# Patient Record
Sex: Male | Born: 1950 | Race: White | Hispanic: No | Marital: Married | Smoking: Former smoker
Health system: Southern US, Community
[De-identification: ages and names within clinical notes are randomized; demographics above are authoritative.]

## PROBLEM LIST (undated history)

## (undated) DIAGNOSIS — I519 Heart disease, unspecified: Secondary | ICD-10-CM

## (undated) DIAGNOSIS — E7800 Pure hypercholesterolemia, unspecified: Secondary | ICD-10-CM

## (undated) DIAGNOSIS — E669 Obesity, unspecified: Secondary | ICD-10-CM

## (undated) DIAGNOSIS — K449 Diaphragmatic hernia without obstruction or gangrene: Secondary | ICD-10-CM

## (undated) DIAGNOSIS — Z9289 Personal history of other medical treatment: Secondary | ICD-10-CM

## (undated) DIAGNOSIS — K219 Gastro-esophageal reflux disease without esophagitis: Secondary | ICD-10-CM

## (undated) DIAGNOSIS — I10 Essential (primary) hypertension: Secondary | ICD-10-CM

## (undated) DIAGNOSIS — E785 Hyperlipidemia, unspecified: Secondary | ICD-10-CM

## (undated) DIAGNOSIS — K760 Fatty (change of) liver, not elsewhere classified: Secondary | ICD-10-CM

## (undated) DIAGNOSIS — I2510 Atherosclerotic heart disease of native coronary artery without angina pectoris: Secondary | ICD-10-CM

## (undated) DIAGNOSIS — G4730 Sleep apnea, unspecified: Secondary | ICD-10-CM

## (undated) DIAGNOSIS — R011 Cardiac murmur, unspecified: Secondary | ICD-10-CM

## (undated) HISTORY — PX: CAROTID STENT: SHX1301

## (undated) HISTORY — DX: Hyperlipidemia, unspecified: E78.5

## (undated) HISTORY — DX: Obesity, unspecified: E66.9

## (undated) HISTORY — DX: Gastro-esophageal reflux disease without esophagitis: K21.9

## (undated) HISTORY — DX: Personal history of other medical treatment: Z92.89

## (undated) HISTORY — PX: TONSILLECTOMY: SUR1361

## (undated) HISTORY — DX: Atherosclerotic heart disease of native coronary artery without angina pectoris: I25.10

---

## 2000-10-06 ENCOUNTER — Other Ambulatory Visit: Admission: RE | Admit: 2000-10-06 | Discharge: 2000-10-06 | Payer: Self-pay | Admitting: Gastroenterology

## 2000-10-06 ENCOUNTER — Encounter (INDEPENDENT_AMBULATORY_CARE_PROVIDER_SITE_OTHER): Payer: Self-pay | Admitting: Specialist

## 2000-10-12 ENCOUNTER — Encounter: Payer: Self-pay | Admitting: Gastroenterology

## 2000-10-12 ENCOUNTER — Ambulatory Visit (HOSPITAL_COMMUNITY): Admission: RE | Admit: 2000-10-12 | Discharge: 2000-10-12 | Payer: Self-pay | Admitting: Gastroenterology

## 2002-05-24 ENCOUNTER — Ambulatory Visit (HOSPITAL_COMMUNITY): Admission: RE | Admit: 2002-05-24 | Discharge: 2002-05-24 | Payer: Self-pay | Admitting: Internal Medicine

## 2002-05-25 ENCOUNTER — Encounter (INDEPENDENT_AMBULATORY_CARE_PROVIDER_SITE_OTHER): Payer: Self-pay | Admitting: Internal Medicine

## 2002-05-25 ENCOUNTER — Ambulatory Visit (HOSPITAL_COMMUNITY): Admission: RE | Admit: 2002-05-25 | Discharge: 2002-05-25 | Payer: Self-pay | Admitting: Internal Medicine

## 2002-07-19 ENCOUNTER — Ambulatory Visit (HOSPITAL_COMMUNITY): Admission: RE | Admit: 2002-07-19 | Discharge: 2002-07-19 | Payer: Self-pay | Admitting: Internal Medicine

## 2002-07-19 ENCOUNTER — Encounter (INDEPENDENT_AMBULATORY_CARE_PROVIDER_SITE_OTHER): Payer: Self-pay | Admitting: Internal Medicine

## 2003-03-19 ENCOUNTER — Ambulatory Visit (HOSPITAL_COMMUNITY): Admission: RE | Admit: 2003-03-19 | Discharge: 2003-03-19 | Payer: Self-pay | Admitting: Pulmonary Disease

## 2003-05-16 ENCOUNTER — Emergency Department (HOSPITAL_COMMUNITY): Admission: EM | Admit: 2003-05-16 | Discharge: 2003-05-16 | Payer: Self-pay | Admitting: Emergency Medicine

## 2004-08-06 ENCOUNTER — Ambulatory Visit (HOSPITAL_COMMUNITY): Admission: RE | Admit: 2004-08-06 | Discharge: 2004-08-06 | Payer: Self-pay | Admitting: Internal Medicine

## 2004-08-06 ENCOUNTER — Encounter (INDEPENDENT_AMBULATORY_CARE_PROVIDER_SITE_OTHER): Payer: Self-pay | Admitting: Specialist

## 2005-04-08 ENCOUNTER — Ambulatory Visit: Payer: Self-pay | Admitting: Internal Medicine

## 2005-04-10 ENCOUNTER — Ambulatory Visit (HOSPITAL_COMMUNITY): Admission: RE | Admit: 2005-04-10 | Discharge: 2005-04-10 | Payer: Self-pay | Admitting: Internal Medicine

## 2005-07-17 ENCOUNTER — Ambulatory Visit: Payer: Self-pay | Admitting: Internal Medicine

## 2006-03-29 ENCOUNTER — Emergency Department (HOSPITAL_COMMUNITY): Admission: EM | Admit: 2006-03-29 | Discharge: 2006-03-30 | Payer: Self-pay | Admitting: Emergency Medicine

## 2006-04-21 ENCOUNTER — Ambulatory Visit: Payer: Self-pay | Admitting: Internal Medicine

## 2006-09-23 ENCOUNTER — Ambulatory Visit (HOSPITAL_COMMUNITY): Admission: RE | Admit: 2006-09-23 | Discharge: 2006-09-23 | Payer: Self-pay | Admitting: Pulmonary Disease

## 2006-09-30 ENCOUNTER — Encounter (HOSPITAL_COMMUNITY): Admission: RE | Admit: 2006-09-30 | Discharge: 2006-10-30 | Payer: Self-pay | Admitting: Pulmonary Disease

## 2006-10-12 ENCOUNTER — Ambulatory Visit: Payer: Self-pay | Admitting: Gastroenterology

## 2008-09-20 ENCOUNTER — Ambulatory Visit (HOSPITAL_COMMUNITY): Admission: RE | Admit: 2008-09-20 | Discharge: 2008-09-20 | Payer: Self-pay | Admitting: Pulmonary Disease

## 2008-09-26 ENCOUNTER — Ambulatory Visit: Payer: Self-pay | Admitting: Cardiology

## 2008-09-26 ENCOUNTER — Ambulatory Visit (HOSPITAL_COMMUNITY): Admission: RE | Admit: 2008-09-26 | Discharge: 2008-09-26 | Payer: Self-pay | Admitting: Pulmonary Disease

## 2008-09-26 ENCOUNTER — Encounter (INDEPENDENT_AMBULATORY_CARE_PROVIDER_SITE_OTHER): Payer: Self-pay | Admitting: Pulmonary Disease

## 2008-10-09 ENCOUNTER — Ambulatory Visit (HOSPITAL_COMMUNITY): Admission: RE | Admit: 2008-10-09 | Discharge: 2008-10-09 | Payer: Self-pay | Admitting: Pulmonary Disease

## 2008-10-10 ENCOUNTER — Ambulatory Visit (HOSPITAL_COMMUNITY): Admission: RE | Admit: 2008-10-10 | Discharge: 2008-10-10 | Payer: Self-pay | Admitting: Pulmonary Disease

## 2008-11-17 ENCOUNTER — Encounter: Payer: Self-pay | Admitting: Orthopedic Surgery

## 2008-11-17 ENCOUNTER — Emergency Department (HOSPITAL_COMMUNITY): Admission: EM | Admit: 2008-11-17 | Discharge: 2008-11-17 | Payer: Self-pay | Admitting: Emergency Medicine

## 2008-11-20 ENCOUNTER — Ambulatory Visit: Payer: Self-pay | Admitting: Orthopedic Surgery

## 2008-11-20 DIAGNOSIS — S8253XA Displaced fracture of medial malleolus of unspecified tibia, initial encounter for closed fracture: Secondary | ICD-10-CM | POA: Insufficient documentation

## 2008-11-20 DIAGNOSIS — S93409A Sprain of unspecified ligament of unspecified ankle, initial encounter: Secondary | ICD-10-CM | POA: Insufficient documentation

## 2008-11-26 ENCOUNTER — Encounter: Payer: Self-pay | Admitting: Orthopedic Surgery

## 2008-12-28 DIAGNOSIS — I219 Acute myocardial infarction, unspecified: Secondary | ICD-10-CM

## 2008-12-28 HISTORY — DX: Acute myocardial infarction, unspecified: I21.9

## 2008-12-31 ENCOUNTER — Ambulatory Visit: Payer: Self-pay | Admitting: Orthopedic Surgery

## 2009-10-28 HISTORY — PX: CARDIAC CATHETERIZATION: SHX172

## 2009-11-08 ENCOUNTER — Inpatient Hospital Stay (HOSPITAL_COMMUNITY): Admission: RE | Admit: 2009-11-08 | Discharge: 2009-11-10 | Payer: Self-pay | Admitting: Cardiology

## 2009-12-09 ENCOUNTER — Encounter (HOSPITAL_COMMUNITY): Admission: RE | Admit: 2009-12-09 | Discharge: 2009-12-27 | Payer: Self-pay | Admitting: Cardiology

## 2009-12-30 ENCOUNTER — Encounter (HOSPITAL_COMMUNITY): Admission: RE | Admit: 2009-12-30 | Discharge: 2010-01-29 | Payer: Self-pay | Admitting: Cardiology

## 2010-01-30 ENCOUNTER — Encounter (HOSPITAL_COMMUNITY): Admission: RE | Admit: 2010-01-30 | Discharge: 2010-03-01 | Payer: Self-pay | Admitting: Cardiology

## 2010-03-03 ENCOUNTER — Encounter (HOSPITAL_COMMUNITY): Admission: RE | Admit: 2010-03-03 | Discharge: 2010-04-02 | Payer: Self-pay | Admitting: Cardiology

## 2010-06-02 ENCOUNTER — Ambulatory Visit (HOSPITAL_COMMUNITY): Admission: RE | Admit: 2010-06-02 | Discharge: 2010-06-02 | Payer: Self-pay | Admitting: Pulmonary Disease

## 2010-07-25 ENCOUNTER — Encounter: Admission: RE | Admit: 2010-07-25 | Discharge: 2010-07-25 | Payer: Self-pay | Admitting: Neurosurgery

## 2010-08-08 ENCOUNTER — Encounter: Admission: RE | Admit: 2010-08-08 | Discharge: 2010-08-08 | Payer: Self-pay | Admitting: Neurosurgery

## 2010-12-23 DIAGNOSIS — R0789 Other chest pain: Secondary | ICD-10-CM

## 2011-01-26 ENCOUNTER — Ambulatory Visit
Admission: RE | Admit: 2011-01-26 | Discharge: 2011-01-26 | Payer: Self-pay | Source: Home / Self Care | Attending: Internal Medicine | Admitting: Internal Medicine

## 2011-02-02 ENCOUNTER — Encounter: Payer: Self-pay | Admitting: Cardiology

## 2011-02-12 NOTE — Procedures (Signed)
Summary: [facility]  [facility]   Imported By: Faythe Ghee 02/02/2011 16:01:10  _____________________________________________________________________  External Attachment:    Type:   Image     Comment:   External Document

## 2011-04-01 LAB — POCT CARDIAC MARKERS: Myoglobin, poc: 196 ng/mL (ref 12–200)

## 2011-04-01 LAB — HEMOGLOBIN A1C
Hgb A1c MFr Bld: 5.6 % (ref 4.6–6.1)
Mean Plasma Glucose: 114 mg/dL

## 2011-04-01 LAB — CBC
HCT: 45.2 % (ref 39.0–52.0)
HCT: 40.8 % (ref 39.0–52.0)
Hemoglobin: 15.6 g/dL (ref 13.0–17.0)
Hemoglobin: 14.4 g/dL (ref 13.0–17.0)
MCV: 85.1 fL (ref 78.0–100.0)
Platelets: 245 10*3/uL (ref 150–400)
RBC: 5.31 MIL/uL (ref 4.22–5.81)
RDW: 14 % (ref 11.5–15.5)
WBC: 7.2 10*3/uL (ref 4.0–10.5)
WBC: 11.2 10*3/uL — ABNORMAL HIGH (ref 4.0–10.5)

## 2011-04-01 LAB — COMPREHENSIVE METABOLIC PANEL
CO2: 25 mEq/L (ref 19–32)
Calcium: 8.5 mg/dL (ref 8.4–10.5)
Chloride: 103 mEq/L (ref 96–112)
Creatinine, Ser: 0.99 mg/dL (ref 0.4–1.5)
GFR calc non Af Amer: >60 mL/min (ref >60)
Glucose, Bld: 97 mg/dL (ref 70–99)
Potassium: 3.4 mEq/L — ABNORMAL LOW (ref 3.5–5.1)
Total Protein: 6.3 g/dL (ref 6.0–8.3)

## 2011-04-01 LAB — BASIC METABOLIC PANEL
BUN: 12 mg/dL (ref 6–23)
BUN: 9 mg/dL (ref 6–23)
CO2: 27 mEq/L (ref 19–32)
Calcium: 8.6 mg/dL (ref 8.4–10.5)
Calcium: 7.9 mg/dL — ABNORMAL LOW (ref 8.4–10.5)
Chloride: 105 mEq/L (ref 96–112)
Chloride: 106 mEq/L (ref 96–112)
Creatinine, Ser: 1 mg/dL (ref 0.4–1.5)
GFR calc Af Amer: >60 mL/min (ref >60)
Potassium: 3.7 mEq/L (ref 3.5–5.1)
Sodium: 139 mEq/L (ref 135–145)
Sodium: 138 mEq/L (ref 135–145)

## 2011-04-01 LAB — URINALYSIS, ROUTINE W REFLEX MICROSCOPIC
Bilirubin Urine: NEGATIVE
Ketones, ur: NEGATIVE mg/dL
Nitrite: NEGATIVE
Protein, ur: NEGATIVE mg/dL
Urobilinogen, UA: 0.2 mg/dL (ref 0.0–1.0)
pH: 5.5 (ref 5.0–8.0)

## 2011-04-01 LAB — CARDIAC PANEL(CRET KIN+CKTOT+MB+TROPI)
CK, MB: 126.6 ng/mL — ABNORMAL HIGH (ref 0.3–4.0)
Relative Index: 9.5 — ABNORMAL HIGH (ref 0.0–2.5)
Total CK: 1045 U/L — ABNORMAL HIGH (ref 7–232)
Total CK: 751 U/L — ABNORMAL HIGH (ref 7–232)
Troponin I: 18.52 ng/mL (ref 0.00–0.06)
Troponin I: 31.04 ng/mL (ref 0.00–0.06)

## 2011-04-01 LAB — DIFFERENTIAL
Basophils Absolute: 0 10*3/uL (ref 0.0–0.1)
Monocytes Relative: 7 % (ref 3–12)
Neutro Abs: 4.6 10*3/uL (ref 1.7–7.7)
Neutrophils Relative %: 64 % (ref 43–77)

## 2011-04-01 LAB — LIPID PANEL
Cholesterol: 145 mg/dL (ref 0–200)
HDL: 37 mg/dL — ABNORMAL LOW (ref >39)
Total CHOL/HDL Ratio: 3.9 RATIO
Triglycerides: 65 mg/dL (ref <150)
VLDL: 13 mg/dL (ref 0–40)

## 2011-04-01 LAB — PROTIME-INR
INR: 1.02 (ref 0.00–1.49)
Prothrombin Time: 13.6 seconds (ref 11.6–15.2)

## 2011-05-15 NOTE — Op Note (Signed)
[facility]  Patient:    Antonio Colon, Antonio Colon Visit Number: [id number] MRN: [id number]          Service Type: DSU Location: DAY Attending Physician:  Malissa Hippo Dictated by:   Lionel December, M.D. Proc. Date: 05/24/02 Admit Date:  05/24/2002 Discharge Date: 05/24/2002                             Operative Report  PROCEDURE:  Esophagogastroduodenoscopy, followed by total colonoscopy.  INDICATIONS:  This is a 60 year old Caucasian male patient of Dr. 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  He recently had an episode of bleeding. When I saw him in the office, a stool was guaiac-positive.  He also complains of recurrent epigastric pain.  Stool cultures and C. difficile toxin titer were negative. O&P was also negative. He did have white cells in his stool.  I reviewed the record from [facility] and noted that EGD had suggested esophageal varices.  He is not aware of this diagnosis.  There is no history of liver disease.  I, therefore, recommended that EGD be performed, along with colonoscopy to find if indeed he has varices, in which case he will need further evaluation.  Both procedures were reviewed with the patient, and an informed consent was obtained.  PREOPERATIVE MEDICATIONS:  Cetacaine spray for pharyngeal topical anesthesia, Demerol 50 mg IV, and Versed 3 mg IV.  INSTRUMENT:  Olympus video system.  FINDINGS:  Procedures performed in endoscopy suite. The patients vital signs and O2 sat were monitored through the procedure and remained stable.  PROCEDURE #1:  Esophagogastroduodenoscopy.  The patient was placed in the left lateral position, and the endoscope was passed without difficulty into the esophagus.  Esophagus:  The mucosa of the esophagus was normal except a single erosion just above the squamocolumnar junction.  However, there were three columns of grade  1 esophageal varices.  Multiple pictures were taken to document this.  Stomach:  It was empty and distended very well with insufflation. The folds in the proximal stomach looked normal.  Examination of the mucosa revealed more or less diffuse changes of edema, granularity, and mosaic pattern.  I felt that this could be indicative of mild portal gastropathy unless it was due to H. pylori infection.  No fundal varices were identified.  Duodenum:  Examination of the bulb and second part of the duodenum was normal. The endoscope was withdrawn, and the patient was prepared for procedure #2.  PROCEDURE #2: Total colonoscopy.  A rectal examination was performed. No abnormalities were noted on external or digital exam.  The scope was placed in the rectum and advanced through the sigmoid colon beyond.  Preparation was satisfactory, but he still had a lot of thick, liquid stool scattered in his colon.  The scope was passed in the cecum, which was identified by appendiceal orifice and ileocecal valve.  I was not able to pass the scope into the TI. The colonic mucosa was carefully examined. There was some mucosal edema and loss of vascularity at the sigmoid colon, but there were no erosions or frank ulcerations.  Biopsies were taken from the right colon and also sigmoid colon and rectum and submitted separately.  There was a small polyp in the rectum, which was ablated by cold biopsy.  The scope was reflexed to examine the anorectal junction, which was unremarkable.  The endoscope was withdrawn.  The patient tolerated the procedure well.  FINAL DIAGNOSES: 1. Erosive reflux esophagitis. 2. 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 3. Diffuse gastritis, which is possibly mild portal gastropathy but need to    rule out Helicobacter pylori infection. 4. No endoscopic evidence of colitis or submucosal edema noted at sigmoid    colon.  Tiny polyp at rectum, which obliterated by cold  biopsy.  RECOMMENDATIONS: 1. Will stop his Colazal as it has not helped his symptomatology.    Furthermore, I do not see endoscopic colitis to explain the symptoms. 2. Will arrange for upper abdominal ultrasound to review his liver and spleen. 3. We will check a CBC, LFTs, PT, serum ferritin, hepatitis B surface antigen,    anti-HCV, and H. pylori serology today. 4. I will be contacting the patient with the results of these studies.  I will    have him stop by the office to pick up samples of PPI. Dictated by:   Lionel December, M.D. Attending Physician:  Malissa Hippo DD:  05/25/02 TD:  05/26/02 Job: [id number]

## 2011-05-15 NOTE — Procedures (Signed)
NAMEFRANCISO, DIERKS NO.:  [id number]   MEDICAL RECORD NO.:  [id number]          PATIENT TYPE:  OUT   LOCATION:  RESP                          FACILITY:  [facility]   PHYSICIAN:  Edward L. Juanetta Gosling, M.D.DATE OF BIRTH:  1951-02-08   DATE OF PROCEDURE:  10/11/2008  DATE OF DISCHARGE:  10/10/2008                            PULMONARY FUNCTION TEST   Pulmonary Function Test  1. Spirometry shows no ventilatory defect and minimal if any airflow      obstruction.  2. Lung volumes are normal.  3. DLCO was normal.      Edward L. Juanetta Gosling, M.D.  Electronically Signed     ELH/MEDQ  D:  10/11/2008  T:  10/12/2008  Job:  [id number]

## 2011-07-24 ENCOUNTER — Other Ambulatory Visit: Payer: Self-pay | Admitting: Urology

## 2011-07-24 ENCOUNTER — Ambulatory Visit (INDEPENDENT_AMBULATORY_CARE_PROVIDER_SITE_OTHER): Payer: BC Managed Care – PPO | Admitting: Urology

## 2011-07-24 DIAGNOSIS — R82998 Other abnormal findings in urine: Secondary | ICD-10-CM

## 2011-07-29 ENCOUNTER — Ambulatory Visit (HOSPITAL_COMMUNITY)
Admission: RE | Admit: 2011-07-29 | Discharge: 2011-07-29 | Disposition: A | Discharge status: Home / Self Care | Payer: BC Managed Care – PPO | Source: Ambulatory Visit | Attending: Urology | Admitting: Urology

## 2011-07-29 DIAGNOSIS — R918 Other nonspecific abnormal finding of lung field: Secondary | ICD-10-CM | POA: Insufficient documentation

## 2011-07-29 DIAGNOSIS — K7689 Other specified diseases of liver: Secondary | ICD-10-CM | POA: Insufficient documentation

## 2011-07-29 DIAGNOSIS — I10 Essential (primary) hypertension: Secondary | ICD-10-CM | POA: Insufficient documentation

## 2011-07-29 DIAGNOSIS — R319 Hematuria, unspecified: Secondary | ICD-10-CM | POA: Insufficient documentation

## 2011-07-29 DIAGNOSIS — I723 Aneurysm of iliac artery: Secondary | ICD-10-CM | POA: Insufficient documentation

## 2011-07-29 DIAGNOSIS — R7989 Other specified abnormal findings of blood chemistry: Secondary | ICD-10-CM | POA: Insufficient documentation

## 2011-07-29 MED ORDER — IOHEXOL 300 MG/ML  SOLN
125.0000 mL | Freq: Once | INTRAMUSCULAR | Status: AC | PRN
  Administered 2011-07-29: 125 mL via INTRAVENOUS

## 2011-08-21 ENCOUNTER — Ambulatory Visit (INDEPENDENT_AMBULATORY_CARE_PROVIDER_SITE_OTHER): Payer: BC Managed Care – PPO | Admitting: Urology

## 2011-08-21 DIAGNOSIS — N3941 Urge incontinence: Secondary | ICD-10-CM

## 2011-08-21 DIAGNOSIS — R82998 Other abnormal findings in urine: Secondary | ICD-10-CM

## 2011-12-18 ENCOUNTER — Encounter (INDEPENDENT_AMBULATORY_CARE_PROVIDER_SITE_OTHER): Payer: Self-pay | Admitting: *Deleted

## 2012-01-20 ENCOUNTER — Other Ambulatory Visit: Payer: Self-pay

## 2012-01-20 ENCOUNTER — Encounter (HOSPITAL_COMMUNITY): Payer: Self-pay | Admitting: *Deleted

## 2012-01-20 ENCOUNTER — Emergency Department (HOSPITAL_COMMUNITY)
Admission: EM | Admit: 2012-01-20 | Discharge: 2012-01-20 | Disposition: A | Discharge status: Home / Self Care | Payer: BC Managed Care – PPO | Attending: Emergency Medicine | Admitting: Emergency Medicine

## 2012-01-20 ENCOUNTER — Emergency Department (HOSPITAL_COMMUNITY): Payer: BC Managed Care – PPO

## 2012-01-20 DIAGNOSIS — I498 Other specified cardiac arrhythmias: Secondary | ICD-10-CM | POA: Insufficient documentation

## 2012-01-20 DIAGNOSIS — Z7982 Long term (current) use of aspirin: Secondary | ICD-10-CM | POA: Insufficient documentation

## 2012-01-20 DIAGNOSIS — I10 Essential (primary) hypertension: Secondary | ICD-10-CM | POA: Insufficient documentation

## 2012-01-20 DIAGNOSIS — E7800 Pure hypercholesterolemia, unspecified: Secondary | ICD-10-CM | POA: Insufficient documentation

## 2012-01-20 DIAGNOSIS — R0789 Other chest pain: Secondary | ICD-10-CM

## 2012-01-20 DIAGNOSIS — I252 Old myocardial infarction: Secondary | ICD-10-CM | POA: Insufficient documentation

## 2012-01-20 DIAGNOSIS — I440 Atrioventricular block, first degree: Secondary | ICD-10-CM | POA: Insufficient documentation

## 2012-01-20 HISTORY — DX: Heart disease, unspecified: I51.9

## 2012-01-20 HISTORY — DX: Pure hypercholesterolemia, unspecified: E78.00

## 2012-01-20 HISTORY — DX: Essential (primary) hypertension: I10

## 2012-01-20 LAB — DIFFERENTIAL
Basophils Relative: 0 % (ref 0–1)
Eosinophils Absolute: 0.2 10*3/uL (ref 0.0–0.7)
Eosinophils Relative: 2 % (ref 0–5)
Lymphs Abs: 2.6 10*3/uL (ref 0.7–4.0)
Monocytes Relative: 11 % (ref 3–12)
Neutrophils Relative %: 52 % (ref 43–77)

## 2012-01-20 LAB — COMPREHENSIVE METABOLIC PANEL
Albumin: 3.7 g/dL (ref 3.5–5.2)
BUN: 19 mg/dL (ref 6–23)
Calcium: 9.2 mg/dL (ref 8.4–10.5)
GFR calc Af Amer: >90 mL/min (ref >90)
Glucose, Bld: 93 mg/dL (ref 70–99)
Sodium: 140 mEq/L (ref 135–145)
Total Protein: 6.9 g/dL (ref 6.0–8.3)

## 2012-01-20 LAB — CBC
MCH: 29.8 pg (ref 26.0–34.0)
MCHC: 33.7 g/dL (ref 30.0–36.0)
MCV: 88.4 fL (ref 78.0–100.0)
Platelets: 218 10*3/uL (ref 150–400)
RBC: 5.07 MIL/uL (ref 4.22–5.81)

## 2012-01-20 LAB — CARDIAC PANEL(CRET KIN+CKTOT+MB+TROPI)
CK, MB: 2.6 ng/mL (ref 0.3–4.0)
Troponin I: <0.3 ng/mL (ref <0.30)

## 2012-01-20 MED ORDER — ASPIRIN 81 MG PO CHEW
243.0000 mg | CHEWABLE_TABLET | Freq: Once | ORAL | Status: AC
  Administered 2012-01-20: 243 mg via ORAL
  Filled 2012-01-20: qty 3

## 2012-01-20 NOTE — ED Provider Notes (Signed)
History   This chart was scribed for Geoffery Lyons, MD by Clarita Crane. The patient was seen in room APA11/APA11 and the patient's care was started at 9:15PM.   CSN: [id number]  Arrival date & time 01/20/12  2046   First MD Initiated Contact with Patient 01/20/12 2108      Chief Complaint  Patient presents with  . Chest Pain    (Consider location/radiation/quality/duration/timing/severity/associated sxs/prior treatment) HPI Antonio Colon is a 61 y.o. male who presents to the Emergency Department complaining of waxing and waning moderate chest pain described as discomfort onset this morning and persistent since. Patient notes current chest pain is not similar pain experienced with MI had 2 years ago. States chest pain is not aggravated or relieved by anything. Denies SOB, diaphoresis, nausea, vomiting, HA. Patient with h/o MI, HTN, hypercholesterolemia and denies h/o diabetes. Patient has taken 81mg  aspirin prior to arrival.    Past Medical History  Diagnosis Date  . Hypertension   . Hypercholesteremia   . Heart disease     Past Surgical History  Procedure Date  . Carotid stent     History reviewed. No pertinent family history.  History  Substance Use Topics  . Smoking status: Never Smoker   . Smokeless tobacco: Not on file  . Alcohol Use: No      Review of Systems 10 Systems reviewed and are negative for acute change except as noted in the HPI.  Allergies  Review of patient's allergies indicates no known allergies.  Home Medications   Current Outpatient Rx  Name Route Sig Dispense Refill  . ASPIRIN EC 81 MG PO TBEC Oral Take 81 mg by mouth daily.    Marland Kitchen LOSARTAN POTASSIUM 50 MG PO TABS Oral Take 50 mg by mouth daily.    . NEBIVOLOL HCL 5 MG PO TABS Oral Take 5 mg by mouth daily.    Marland Kitchen NITROGLYCERIN 0.4 MG SL SUBL Sublingual Place 0.4 mg under the tongue every 5 (five) minutes as needed.    Marland Kitchen PANTOPRAZOLE SODIUM 40 MG PO TBEC Oral Take 40 mg by mouth daily.    Marland Kitchen  PRASUGREL HCL 10 MG PO TABS Oral Take 10 mg by mouth daily.    Marland Kitchen ROSUVASTATIN CALCIUM 5 MG PO TABS Oral Take 5 mg by mouth daily after supper.    Marland Kitchen SOLIFENACIN SUCCINATE 10 MG PO TABS Oral Take 5 mg by mouth daily.      BP 133/78  Pulse 58  Temp 97.9 F (36.6 C)  Resp 20  Ht 5\' 10"  (1.778 m)  Wt 248 lb (112.492 kg)  BMI 35.58 kg/m2  SpO2 96%  Physical Exam  Nursing note and vitals reviewed. Constitutional: He is oriented to person, place, and time. He appears well-developed and well-nourished. No distress.  HENT:  Head: Normocephalic and atraumatic.  Eyes: EOM are normal. Pupils are equal, round, and reactive to light.  Neck: Neck supple. No tracheal deviation present.  Cardiovascular: Normal rate and regular rhythm.  Exam reveals no gallop and no friction rub.   No murmur heard. Pulmonary/Chest: Effort normal. No respiratory distress. He has no wheezes. He has no rales. He exhibits no tenderness.  Abdominal: Soft. He exhibits no distension. There is no tenderness.  Musculoskeletal: Normal range of motion. He exhibits no edema.  Neurological: He is alert and oriented to person, place, and time. No sensory deficit.  Skin: Skin is warm and dry.  Psychiatric: He has a normal mood and affect. His behavior is  normal.    ED Course  Procedures (including critical care time)  DIAGNOSTIC STUDIES: Oxygen Saturation is 98% on room air, normal by my interpretation.    COORDINATION OF CARE: 9:23PM- Patient informed of current clinical impression and plan for treatment in ED. Patient agrees with plan set forth at this time.     Labs Reviewed  CARDIAC PANEL(CRET KIN+CKTOT+MB+TROPI)  CBC  DIFFERENTIAL  COMPREHENSIVE METABOLIC PANEL   Dg Chest Port 1 View  01/20/2012  *RADIOLOGY REPORT*  Clinical Data: Chest pain.  PORTABLE CHEST - 1 VIEW  Comparison: Chest 11/08/2009.  Findings: There is cardiomegaly but no edema.  Lungs are clear.  No pneumothorax or effusion.  IMPRESSION:  Cardiomegaly without acute disease.  Original Report Authenticated By: Bernadene Bell. Maricela Curet, M.D.     No diagnosis found.   Date: 01/20/2012  Rate: 57  Rhythm: sinus bradycardia  QRS Axis: normal  Intervals: Prolonged PR  ST/T Wave abnormalities: normal  Conduction Disutrbances:first-degree A-V block   Narrative Interpretation:   Old EKG Reviewed: unchanged    MDM  The patient presented here with symptoms that sound atypical for heart pain and and inconsistent with what he experienced with his prior stent.  His enzymes and ekg were negative after having this sensation all day, and he is currently pain-free.  I spoke with him for some time and we have decided that he would prefer to go home and make an appointment with his cardiologist, Dr. Allyson Sabal in the morning.  I am okay with this as long as he returns if his symptoms worsen.        I personally performed the services described in this documentation, which was scribed in my presence. The recorded information has been reviewed and considered.      Geoffery Lyons, MD 01/20/12 2250

## 2012-01-20 NOTE — ED Notes (Signed)
Patient reports having chest discomfort that started this morning and has been intermittent throughout the day. Denies n,v, diaphoresis, or sob.

## 2012-01-20 NOTE — ED Notes (Signed)
Chest pain onset today °

## 2012-01-26 ENCOUNTER — Encounter (INDEPENDENT_AMBULATORY_CARE_PROVIDER_SITE_OTHER): Payer: Self-pay | Admitting: Internal Medicine

## 2012-01-26 ENCOUNTER — Ambulatory Visit (INDEPENDENT_AMBULATORY_CARE_PROVIDER_SITE_OTHER): Payer: BC Managed Care – PPO | Admitting: Internal Medicine

## 2012-01-26 DIAGNOSIS — I2510 Atherosclerotic heart disease of native coronary artery without angina pectoris: Secondary | ICD-10-CM | POA: Insufficient documentation

## 2012-01-26 DIAGNOSIS — K7689 Other specified diseases of liver: Secondary | ICD-10-CM

## 2012-01-26 DIAGNOSIS — E785 Hyperlipidemia, unspecified: Secondary | ICD-10-CM | POA: Insufficient documentation

## 2012-01-26 DIAGNOSIS — K219 Gastro-esophageal reflux disease without esophagitis: Secondary | ICD-10-CM

## 2012-01-26 DIAGNOSIS — K760 Fatty (change of) liver, not elsewhere classified: Secondary | ICD-10-CM | POA: Insufficient documentation

## 2012-01-26 DIAGNOSIS — I10 Essential (primary) hypertension: Secondary | ICD-10-CM | POA: Insufficient documentation

## 2012-01-26 NOTE — Patient Instructions (Signed)
Colonoscopy to be scheduled in April or may this year.

## 2012-01-26 NOTE — Progress Notes (Signed)
Presenting complaint; Followup for GERD and fatty liver. Subjective: Antonio Colon is 61 year old Caucasian male patient of Dr. Juanetta Gosling was there for yearly visit. He states he is doing well. He rarely experiences heartburn. Since he's changed his eating habits his diarrhea has resolved. Last Wednesday he was seen in ER for chest pain EKG and enzymes were normal. He was seen by his cardiologist is scheduled to have stress test later this week. He has not lost any weight since his last visit one year ago. He lifts some weights and also does treadmill for about 45 minutes on most days. He has 1-2 formed stools daily and he denies melena or rectal bleeding. His last colonoscopy was in May 2003. Current Medications: Current Outpatient Prescriptions  Medication Sig Dispense Refill  . aspirin EC 81 MG tablet Take 81 mg by mouth daily.      Marland Kitchen losartan (COZAAR) 50 MG tablet Take 50 mg by mouth daily.      . nebivolol (BYSTOLIC) 5 MG tablet Take 5 mg by mouth daily.      . nitroGLYCERIN (NITROSTAT) 0.4 MG SL tablet Place 0.4 mg under the tongue every 5 (five) minutes as needed.      . pantoprazole (PROTONIX) 40 MG tablet Take 40 mg by mouth daily.      . prasugrel (EFFIENT) 10 MG TABS Take 10 mg by mouth daily.      . rosuvastatin (CRESTOR) 5 MG tablet Take 5 mg by mouth daily after supper.      . solifenacin (VESICARE) 10 MG tablet Take 10 mg by mouth daily.         Objective: Blood pressure 128/78, pulse 74, temperature 98.2 F (36.8 C), temperature source Oral, resp. rate 14, height 5\' 10"  (1.778 m), weight 251 lb (113.853 kg). Conjunctiva is pink. Sclera is nonicteric Oral pharyngeal mucosa is normal. No neck masses or thyromegaly noted. Cardiac exam with regular rhythm normal S1 and S2. No murmur or gallop noted. Lungs are clear to auscultation. Abdomen is obese. Bowel sounds are normal. Abdomen is soft and nontender spleen is not palpable. Liver edge is indistinct low RCM.  No LE edema or clubbing  noted.  Labs/studies Results: Data from 01/20/2012. WBC 7.5, hemoglobin 15.1, hematocrit 44.8, platelet count 218K. Bilirubin was 0.4, AP 76, AST 18, ALT 21, albumin 3.7, creatinine 0.90, glucose 93.  Assessment: #1. Chronic GERD. Symptoms are well controlled with therapy. #2. NAFLD. His platelet count and LFTs are normal. He had liver biopsy in August 2005 which reveals mild steatohepatitis and rare foci of mild peri-venular fibrosis. He does not have stigmata of chronic liver. He needs to continue his current lifestyle and should try to lose another 30 pounds.   Plan: Continue anti-reflux measures and pantoprazole at current dose. Screening colonoscopy to be scheduled in April or May this year. Office visit in one year.

## 2012-01-27 DIAGNOSIS — Z9289 Personal history of other medical treatment: Secondary | ICD-10-CM

## 2012-01-27 HISTORY — DX: Personal history of other medical treatment: Z92.89

## 2012-03-30 ENCOUNTER — Encounter (INDEPENDENT_AMBULATORY_CARE_PROVIDER_SITE_OTHER): Payer: Self-pay | Admitting: *Deleted

## 2012-05-18 ENCOUNTER — Telehealth (INDEPENDENT_AMBULATORY_CARE_PROVIDER_SITE_OTHER): Payer: Self-pay | Admitting: *Deleted

## 2012-05-18 ENCOUNTER — Other Ambulatory Visit (INDEPENDENT_AMBULATORY_CARE_PROVIDER_SITE_OTHER): Payer: Self-pay | Admitting: *Deleted

## 2012-05-18 DIAGNOSIS — Z1211 Encounter for screening for malignant neoplasm of colon: Secondary | ICD-10-CM

## 2012-05-18 MED ORDER — PEG-KCL-NACL-NASULF-NA ASC-C 100 G PO SOLR: 1.0000 | Freq: Once | ORAL | Status: DC

## 2012-05-18 NOTE — Telephone Encounter (Signed)
Patient needs movi prep 

## 2012-06-08 ENCOUNTER — Encounter (HOSPITAL_COMMUNITY): Payer: Self-pay | Admitting: Pharmacy Technician

## 2012-06-13 ENCOUNTER — Encounter (INDEPENDENT_AMBULATORY_CARE_PROVIDER_SITE_OTHER): Payer: Self-pay | Admitting: *Deleted

## 2012-06-15 ENCOUNTER — Ambulatory Visit (HOSPITAL_COMMUNITY)
Admission: RE | Admit: 2012-06-15 | Discharge: 2012-06-15 | Disposition: A | Discharge status: Home Health | Payer: BC Managed Care – PPO | Source: Ambulatory Visit | Attending: Internal Medicine | Admitting: Internal Medicine

## 2012-06-15 ENCOUNTER — Encounter (HOSPITAL_COMMUNITY)
Admission: RE | Disposition: A | Discharge status: Home Health | Payer: Self-pay | Source: Ambulatory Visit | Attending: Internal Medicine

## 2012-06-15 DIAGNOSIS — Z1211 Encounter for screening for malignant neoplasm of colon: Secondary | ICD-10-CM

## 2012-06-15 DIAGNOSIS — Z79899 Other long term (current) drug therapy: Secondary | ICD-10-CM | POA: Insufficient documentation

## 2012-06-15 DIAGNOSIS — I10 Essential (primary) hypertension: Secondary | ICD-10-CM | POA: Insufficient documentation

## 2012-06-15 DIAGNOSIS — Z7982 Long term (current) use of aspirin: Secondary | ICD-10-CM | POA: Insufficient documentation

## 2012-06-15 HISTORY — PX: COLONOSCOPY: SHX5424

## 2012-06-15 SURGERY — COLONOSCOPY
Anesthesia: Moderate Sedation

## 2012-06-15 MED ORDER — MEPERIDINE HCL 50 MG/ML IJ SOLN
INTRAMUSCULAR | Status: DC | PRN
  Administered 2012-06-15 (×2): 25 mg via INTRAVENOUS

## 2012-06-15 MED ORDER — SODIUM CHLORIDE 0.45 % IV SOLN
Freq: Once | INTRAVENOUS | Status: AC
  Administered 2012-06-15: 10:00:00 via INTRAVENOUS

## 2012-06-15 MED ORDER — MIDAZOLAM HCL 5 MG/5ML IJ SOLN
INTRAMUSCULAR | Status: DC | PRN
  Administered 2012-06-15 (×3): 2 mg via INTRAVENOUS

## 2012-06-15 MED ORDER — STERILE WATER FOR IRRIGATION IR SOLN
Status: DC | PRN
  Administered 2012-06-15: 10:00:00

## 2012-06-15 MED ORDER — MIDAZOLAM HCL 5 MG/5ML IJ SOLN
INTRAMUSCULAR | Status: AC
  Filled 2012-06-15: qty 10

## 2012-06-15 MED ORDER — MEPERIDINE HCL 50 MG/ML IJ SOLN
INTRAMUSCULAR | Status: AC
  Filled 2012-06-15: qty 1

## 2012-06-15 NOTE — Op Note (Signed)
COLONOSCOPY PROCEDURE REPORT  PATIENT:  Antonio Colon  MR#:  [id number] Birthdate:  10-26-1951, 61 y.o., male Endoscopist:  Dr. Malissa Hippo, MD Referred By:  Dr. Oneal Deputy. Juanetta Gosling, MD Procedure Date: 06/15/2012  Procedure:   Colonoscopy  Indications: Patient is 61 year old Caucasian male was undergoing average risk screening colonoscopy.  Informed Consent:  The procedure and risks were reviewed with the patient and informed consent was obtained.  Medications:  Demerol 50 mg IV Versed 6 mg IV  Description of procedure:  After a digital rectal exam was performed, that colonoscope was advanced from the anus through the rectum and colon to the area of the cecum, ileocecal valve and appendiceal orifice. The cecum was deeply intubated. These structures were well-seen and photographed for the record. From the level of the cecum and ileocecal valve, the scope was slowly and cautiously withdrawn. The mucosal surfaces were carefully surveyed utilizing scope tip to flexion to facilitate fold flattening as needed. The scope was pulled down into the rectum where a thorough exam including retroflexion was performed. Terminal ileum was also examined.  Findings:   Prep satisfactory. Normal terminal. Normal mucosa colon and rectum. Focal erythema just above the dentate line felt to be secondary to prep.  Therapeutic/Diagnostic Maneuvers Performed:  None  Complications:  None  Cecal Withdrawal Time:  12 minutes  Impression:  Normal terminal ileum and normal colonoscopy.  Recommendations:  Standard instructions given. Next screening exam in 10 years.  Antonio Colon U  06/15/2012 10:47 AM  CC: Dr. Fredirick Maudlin, MD & Dr. Bonnetta Barry ref. provider found

## 2012-06-15 NOTE — H&P (Signed)
Antonio Colon is an 61 y.o. male.   Chief Complaint: Patient is here for colonoscopy. HPI: Patient is 61 year old Caucasian male who is here for a screening colonoscopy. She denies abdominal pain rectal bleeding or change in his bowel habits. Family history is negative for colorectal carcinoma.  Past Medical History  Diagnosis Date  . Hypertension   . Hypercholesteremia   . Heart disease     Past Surgical History  Procedure Date  . Carotid stent     Family History  Problem Relation Age of Onset  . Heart disease Mother   . Healthy Sister    Social History:  reports that he quit smoking about 3 years ago. His smoking use included Cigars. He has never used smokeless tobacco. He reports that he does not drink alcohol. His drug history not on file.  Allergies: No Known Allergies  Medications Prior to Admission  Medication Sig Dispense Refill  . aspirin EC 81 MG tablet Take 81 mg by mouth daily.      Marland Kitchen losartan (COZAAR) 50 MG tablet Take 50 mg by mouth daily.      . nebivolol (BYSTOLIC) 5 MG tablet Take 5 mg by mouth daily.      . nitroGLYCERIN (NITROSTAT) 0.4 MG SL tablet Place 0.4 mg under the tongue every 5 (five) minutes as needed. For chest pain      . peg 3350 powder (MOVIPREP) 100 G SOLR Take 1 kit (100 g total) by mouth once.  1 kit  0  . prasugrel (EFFIENT) 10 MG TABS Take 10 mg by mouth daily.      . rosuvastatin (CRESTOR) 5 MG tablet Take 5 mg by mouth daily after supper.      . solifenacin (VESICARE) 10 MG tablet Take 10 mg by mouth daily.       . pantoprazole (PROTONIX) 40 MG tablet Take 40 mg by mouth daily.        No results found for this or any previous visit (from the past 48 hour(s)). No results found.  ROS  Blood pressure 137/87, pulse 57, temperature 97.4 F (36.3 C), temperature source Oral, resp. rate 18, height 5\' 10"  (1.778 m), weight 248 lb (112.492 kg), SpO2 98.00%. Physical Exam  Constitutional: He appears well-developed and well-nourished.  HENT:    Mouth/Throat: Oropharynx is clear and moist.  Eyes: Conjunctivae are normal. No scleral icterus.  Neck: No thyromegaly present.  Cardiovascular: Normal rate, regular rhythm and normal heart sounds.   No murmur heard. Respiratory: Effort normal and breath sounds normal.  GI: Soft. He exhibits no distension and no mass. There is no tenderness.  Musculoskeletal: He exhibits no edema.  Lymphadenopathy:    He has no cervical adenopathy.  Neurological: He is alert.  Skin: Skin is warm.     Assessment/Plan Average risk screening colonoscopy.  Antonio Colon U 06/15/2012, 10:15 AM

## 2012-06-15 NOTE — Discharge Instructions (Signed)
Resume usual medications including aspirin and effient. Resume usual diet. No driving for 24 hours. Next screening exam in 10 years.   Colonoscopy A colonoscopy is an exam to evaluate your entire colon. In this exam, your colon is cleansed. A long fiberoptic tube is inserted through your rectum and into your colon. The fiberoptic scope (endoscope) is a long bundle of enclosed and very flexible fibers. These fibers transmit light to the area examined and send images from that area to your caregiver. Discomfort is usually minimal. You may be given a drug to help you sleep (sedative) during or prior to the procedure. This exam helps to detect lumps (tumors), polyps, inflammation, and areas of bleeding. Your caregiver may also take a small piece of tissue (biopsy) that will be examined under a microscope. LET YOUR CAREGIVER KNOW ABOUT:   Allergies to food or medicine.   Medicines taken, including vitamins, herbs, eyedrops, over-the-counter medicines, and creams.   Use of steroids (by mouth or creams).   Previous problems with anesthetics or numbing medicines.   History of bleeding problems or blood clots.   Previous surgery.   Other health problems, including diabetes and kidney problems.   Possibility of pregnancy, if this applies.  BEFORE THE PROCEDURE   A clear liquid diet may be required for 2 days before the exam.   Ask your caregiver about changing or stopping your regular medications.   Liquid injections (enemas) or laxatives may be required.   A large amount of electrolyte solution may be given to you to drink over a short period of time. This solution is used to clean out your colon.   You should be present 60 minutes prior to your procedure or as directed by your caregiver.  AFTER THE PROCEDURE   If you received a sedative or pain relieving medication, you will need to arrange for someone to drive you home.   Occasionally, there is a little blood passed with the first  bowel movement. Do not be concerned.  FINDING OUT THE RESULTS OF YOUR TEST Not all test results are available during your visit. If your test results are not back during the visit, make an appointment with your caregiver to find out the results. Do not assume everything is normal if you have not heard from your caregiver or the medical facility. It is important for you to follow up on all of your test results. HOME CARE INSTRUCTIONS   It is not unusual to pass moderate amounts of gas and experience mild abdominal cramping following the procedure. This is due to air being used to inflate your colon during the exam. Walking or a warm pack on your belly (abdomen) may help.   You may resume all normal meals and activities after sedatives and medicines have worn off.   Only take over-the-counter or prescription medicines for pain, discomfort, or fever as directed by your caregiver. Do not use aspirin or blood thinners if a biopsy was taken. Consult your caregiver for medicine usage if biopsies were taken.  SEEK IMMEDIATE MEDICAL CARE IF:   You have a fever.   You pass large blood clots or fill a toilet with blood following the procedure. This may also occur 10 to 14 days following the procedure. This is more likely if a biopsy was taken.   You develop abdominal pain that keeps getting worse and cannot be relieved with medicine.  Document Released: 12/11/2000 Document Revised: 12/03/2011 Document Reviewed: 07/26/2008 [facility] Patient Information 2012 [place], [place].

## 2012-06-16 ENCOUNTER — Encounter (HOSPITAL_COMMUNITY): Payer: Self-pay | Admitting: Internal Medicine

## 2013-05-15 ENCOUNTER — Other Ambulatory Visit: Payer: Self-pay | Admitting: *Deleted

## 2013-05-15 MED ORDER — NEBIVOLOL HCL 5 MG PO TABS: 5.0000 mg | ORAL_TABLET | Freq: Every day | ORAL | Status: DC

## 2013-05-19 ENCOUNTER — Other Ambulatory Visit: Payer: Self-pay | Admitting: *Deleted

## 2013-05-19 MED ORDER — PANTOPRAZOLE SODIUM 40 MG PO TBEC: 40.0000 mg | DELAYED_RELEASE_TABLET | Freq: Every day | ORAL | Status: DC

## 2013-05-23 ENCOUNTER — Other Ambulatory Visit: Payer: Self-pay | Admitting: *Deleted

## 2013-05-23 MED ORDER — PRASUGREL HCL 10 MG PO TABS: 10.0000 mg | ORAL_TABLET | Freq: Every day | ORAL | Status: DC

## 2013-05-23 NOTE — Telephone Encounter (Signed)
rx refill for effient

## 2013-05-28 ENCOUNTER — Encounter: Payer: Self-pay | Admitting: *Deleted

## 2013-06-05 ENCOUNTER — Encounter: Payer: Self-pay | Admitting: Cardiovascular Disease

## 2013-06-15 ENCOUNTER — Telehealth: Payer: Self-pay | Admitting: *Deleted

## 2013-06-19 ENCOUNTER — Other Ambulatory Visit: Payer: Self-pay | Admitting: *Deleted

## 2013-06-19 MED ORDER — PANTOPRAZOLE SODIUM 40 MG PO TBEC: 40.0000 mg | DELAYED_RELEASE_TABLET | Freq: Every day | ORAL | Status: DC

## 2013-06-19 NOTE — Telephone Encounter (Signed)
pantoprazole 40mg  filled

## 2013-06-23 ENCOUNTER — Other Ambulatory Visit: Payer: Self-pay | Admitting: *Deleted

## 2013-06-23 MED ORDER — PRASUGREL HCL 10 MG PO TABS: 10.0000 mg | ORAL_TABLET | Freq: Every day | ORAL | Status: DC

## 2013-07-05 ENCOUNTER — Other Ambulatory Visit: Payer: Self-pay | Admitting: *Deleted

## 2013-07-05 ENCOUNTER — Encounter: Payer: Self-pay | Admitting: Cardiovascular Disease

## 2013-07-05 ENCOUNTER — Other Ambulatory Visit: Payer: Self-pay | Admitting: Cardiovascular Disease

## 2013-07-05 ENCOUNTER — Ambulatory Visit (INDEPENDENT_AMBULATORY_CARE_PROVIDER_SITE_OTHER): Payer: BC Managed Care – PPO | Admitting: Cardiovascular Disease

## 2013-07-05 VITALS — BP 138/90 | HR 66 | Ht 70.0 in | Wt 281.5 lb

## 2013-07-05 DIAGNOSIS — E785 Hyperlipidemia, unspecified: Secondary | ICD-10-CM

## 2013-07-05 DIAGNOSIS — I2510 Atherosclerotic heart disease of native coronary artery without angina pectoris: Secondary | ICD-10-CM

## 2013-07-05 LAB — LIPID PANEL
HDL: 42 mg/dL (ref >39)
LDL Cholesterol: 60 mg/dL (ref 0–99)
Total CHOL/HDL Ratio: 2.7 Ratio
VLDL: 12 mg/dL (ref 0–40)

## 2013-07-05 LAB — COMPREHENSIVE METABOLIC PANEL
ALT: 24 U/L (ref 0–53)
AST: 19 U/L (ref 0–37)
Alkaline Phosphatase: 79 U/L (ref 39–117)
BUN: 16 mg/dL (ref 6–23)
Calcium: 8.7 mg/dL (ref 8.4–10.5)
Chloride: 105 mEq/L (ref 96–112)
Creat: 0.9 mg/dL (ref 0.50–1.35)
Total Bilirubin: 0.8 mg/dL (ref 0.3–1.2)

## 2013-07-05 MED ORDER — NITROGLYCERIN 0.4 MG SL SUBL: 0.4000 mg | SUBLINGUAL_TABLET | SUBLINGUAL | Status: AC | PRN

## 2013-07-05 NOTE — Assessment & Plan Note (Signed)
Status post LAD stenting by Dr. Caprice Kluver November 2010 with a 3 mm x 18 mm long in the upper drug-eluting stent. The circumflex and right renal arteries were normal at that time. He's had a negative Myoview stress test performed 01/27/12. He currently denies chest pain

## 2013-07-05 NOTE — Progress Notes (Signed)
07/05/2013 Antonio Colon   May 21, 1951  [id number]  Primary Physician HAWKINS,EDWARD Elbert Ewings, MD Primary Cardiologist: Runell Gess MD Roseanne Reno   HPI:  The patient is a 62 year old, moderate to severely overweight, married Caucasian male with no children, who works as a Clinical biochemist. I last saw him six months ago. He has a history of CAD status post LAD stenting using an Endeavor drug eluting stent (3 x 18) by Dr. Caprice Kluver in November 2010. He had normal circumflex and RCA at that time. His other problems include: Remote tobacco abuse, treated hypertension, and dyslipidemia. He saw Dr. [place] Hilty in the office on January 21, 2012, complaining of chest pain, and a Myoview stress test performed a week later showed no ischemia. He does have a history of GERD. He has had no recurrent symptoms. His last lipid profile performed back in December 2012 revealed a total cholesterol of 108, LDL of 59, and HDL of 40.       Current Outpatient Prescriptions  Medication Sig Dispense Refill  . aspirin EC 81 MG tablet Take 81 mg by mouth daily.      Marland Kitchen losartan (COZAAR) 50 MG tablet Take 50 mg by mouth daily.      . mirabegron ER (MYRBETRIQ) 25 MG TB24 Take 25 mg by mouth daily.      . nebivolol (BYSTOLIC) 5 MG tablet Take 1 tablet (5 mg total) by mouth daily.  30 tablet  6  . pantoprazole (PROTONIX) 40 MG tablet Take 1 tablet (40 mg total) by mouth daily.  30 tablet  2  . prasugrel (EFFIENT) 10 MG TABS Take 1 tablet (10 mg total) by mouth daily.  30 tablet  3  . rosuvastatin (CRESTOR) 5 MG tablet Take 5 mg by mouth daily after supper.      . nitroGLYCERIN (NITROSTAT) 0.4 MG SL tablet Place 1 tablet (0.4 mg total) under the tongue every 5 (five) minutes as needed. For chest pain  25 tablet  11   No current facility-administered medications for this visit.    No Known Allergies  History   Social History  . Marital Status: Married    Spouse Name: N/A    Number of Children:  N/A  . Years of Education: N/A   Occupational History  . Not on file.   Social History Main Topics  . Smoking status: Former Smoker    Types: Cigarettes, Cigars    Quit date: 01/25/2009  . Smokeless tobacco: Never Used  . Alcohol Use: No  . Drug Use: No  . Sexually Active: Not on file   Other Topics Concern  . Not on file   Social History Narrative  . No narrative on file     Review of Systems: General: negative for chills, fever, night sweats or weight changes.  Cardiovascular: negative for chest pain, dyspnea on exertion, edema, orthopnea, palpitations, paroxysmal nocturnal dyspnea or shortness of breath Dermatological: negative for rash Respiratory: negative for cough or wheezing Urologic: negative for hematuria Abdominal: negative for nausea, vomiting, diarrhea, bright red blood per rectum, melena, or hematemesis Neurologic: negative for visual changes, syncope, or dizziness All other systems reviewed and are otherwise negative except as noted above.    Blood pressure 138/90, pulse 66, height 5\' 10"  (1.778 m), weight 281 lb 8 oz (127.688 kg).  General appearance: alert and no distress Neck: no adenopathy, no carotid bruit, no JVD, supple, symmetrical, trachea midline and thyroid not enlarged, symmetric, no tenderness/mass/nodules Lungs: clear  to auscultation bilaterally Heart: regular rate and rhythm, S1, S2 normal, no murmur, click, rub or gallop Extremities: extremities normal, atraumatic, no cyanosis or edema  EKG normal sinus rhythm at 66 with first degree AV block and Q waves in leads 3 and F.  ASSESSMENT AND PLAN:   CAD (coronary artery disease) Status post LAD stenting by Dr. Caprice Kluver November 2010 with a 3 mm x 18 mm long in the upper drug-eluting stent. The circumflex and right renal arteries were normal at that time. He's had a negative Myoview stress test performed 01/27/12. He currently denies chest pain  Hyperlipemia On statin drug followed by his  PCP      Runell Gess MD [facility], [facility] 07/05/2013 8:55 AM

## 2013-07-05 NOTE — Assessment & Plan Note (Signed)
On statin drug followed by his PCP

## 2013-07-05 NOTE — Patient Instructions (Addendum)
Your physician recommends that you schedule a follow-up appointment in: 1 year  

## 2013-07-05 NOTE — Telephone Encounter (Signed)
Rx was sent to pharmacy electronically. 

## 2013-07-17 ENCOUNTER — Encounter: Payer: Self-pay | Admitting: *Deleted

## 2013-07-17 NOTE — Telephone Encounter (Signed)
letter

## 2013-07-24 ENCOUNTER — Other Ambulatory Visit: Payer: Self-pay | Admitting: *Deleted

## 2013-07-24 MED ORDER — ROSUVASTATIN CALCIUM 5 MG PO TABS: 5.0000 mg | ORAL_TABLET | ORAL | Status: DC

## 2013-07-24 NOTE — Telephone Encounter (Signed)
Rx was sent to pharmacy electronically. 

## 2013-09-18 ENCOUNTER — Other Ambulatory Visit: Payer: Self-pay | Admitting: *Deleted

## 2013-09-18 MED ORDER — PANTOPRAZOLE SODIUM 40 MG PO TBEC: 40.0000 mg | DELAYED_RELEASE_TABLET | Freq: Every day | ORAL | Status: DC

## 2013-09-21 ENCOUNTER — Other Ambulatory Visit: Payer: Self-pay | Admitting: *Deleted

## 2013-09-21 MED ORDER — PRASUGREL HCL 10 MG PO TABS: 10.0000 mg | ORAL_TABLET | Freq: Every day | ORAL | Status: DC

## 2013-09-21 NOTE — Telephone Encounter (Signed)
Rx was sent to pharmacy electronically. 

## 2013-10-08 ENCOUNTER — Encounter (HOSPITAL_COMMUNITY): Payer: Self-pay | Admitting: Emergency Medicine

## 2013-10-08 ENCOUNTER — Emergency Department (HOSPITAL_COMMUNITY)
Admission: EM | Admit: 2013-10-08 | Discharge: 2013-10-08 | Disposition: A | Discharge status: Home / Self Care | Payer: BC Managed Care – PPO | Attending: Emergency Medicine | Admitting: Emergency Medicine

## 2013-10-08 DIAGNOSIS — I10 Essential (primary) hypertension: Secondary | ICD-10-CM | POA: Insufficient documentation

## 2013-10-08 DIAGNOSIS — Z7982 Long term (current) use of aspirin: Secondary | ICD-10-CM | POA: Insufficient documentation

## 2013-10-08 DIAGNOSIS — Z79899 Other long term (current) drug therapy: Secondary | ICD-10-CM | POA: Insufficient documentation

## 2013-10-08 DIAGNOSIS — I2510 Atherosclerotic heart disease of native coronary artery without angina pectoris: Secondary | ICD-10-CM | POA: Insufficient documentation

## 2013-10-08 DIAGNOSIS — E785 Hyperlipidemia, unspecified: Secondary | ICD-10-CM | POA: Insufficient documentation

## 2013-10-08 DIAGNOSIS — R040 Epistaxis: Secondary | ICD-10-CM | POA: Insufficient documentation

## 2013-10-08 DIAGNOSIS — E7800 Pure hypercholesterolemia, unspecified: Secondary | ICD-10-CM | POA: Insufficient documentation

## 2013-10-08 DIAGNOSIS — K219 Gastro-esophageal reflux disease without esophagitis: Secondary | ICD-10-CM | POA: Insufficient documentation

## 2013-10-08 DIAGNOSIS — Z87891 Personal history of nicotine dependence: Secondary | ICD-10-CM | POA: Insufficient documentation

## 2013-10-08 MED ORDER — BACITRACIN 500 UNIT/GM EX OINT
1.0000 "application " | TOPICAL_OINTMENT | Freq: Two times a day (BID) | CUTANEOUS | Status: DC
  Administered 2013-10-08: 1 via TOPICAL
  Filled 2013-10-08: qty 0.9
  Filled 2013-10-08: qty 1.8
  Filled 2013-10-08 (×3): qty 0.9

## 2013-10-08 MED ORDER — BACITRACIN ZINC 500 UNIT/GM EX OINT
TOPICAL_OINTMENT | CUTANEOUS | Status: AC
  Filled 2013-10-08: qty 0.9

## 2013-10-08 MED ORDER — OXYMETAZOLINE HCL 0.05 % NA SOLN
1.0000 | Freq: Once | NASAL | Status: AC
  Administered 2013-10-08: 1 via NASAL
  Filled 2013-10-08: qty 15

## 2013-10-08 NOTE — ED Notes (Signed)
Pt had nose bleed x 4 different times today. Lasts approx few minutes. Stops on its own. Out of right nostril. Nad.

## 2013-10-08 NOTE — ED Provider Notes (Signed)
CSN: [id number]     Arrival date & time 10/08/13  1814 History   First MD Initiated Contact with Patient 10/08/13 1853     Chief Complaint  Patient presents with  . Epistaxis   (Consider location/radiation/quality/duration/timing/severity/associated sxs/prior Treatment) HPI Comments: 62 year old male who is on blood thinners because of his heart attack in the past. He presents with a complaint of a nosebleed which started earlier in the day around noon when he blew his nose. It has bled several different times for a couple of minutes, it resolves with holding pressure on the anterior nose, does not drink of his throat to any significant degree it is not causing any shortness of breath. The symptoms are intermittent, mild, improved with holding pressure. He has no history of easy bleeding, no blood in his stools.  Patient is a 62 y.o. male presenting with nosebleeds. The history is provided by the patient and a relative.  Epistaxis Associated symptoms: no cough     Past Medical History  Diagnosis Date  . Hypertension   . Hypercholesteremia   . Heart disease   . CAD (coronary artery disease)     stents  . Hyperlipidemia   . GERD (gastroesophageal reflux disease)   . History of stress test 01/27/2012    Normal study. No significant ischemia demonstration, this low risk scan, no significant change compared to previous study.   Past Surgical History  Procedure Laterality Date  . Carotid stent    . Colonoscopy  06/15/2012  . Cardiac catheterization  10/2009    CAD stents to the LAD using an Endeavor drug eluting (3x39mm) by Dr Clarene Duke, he had normal circumflex and RCA at the time.   Family History  Problem Relation Age of Onset  . Heart disease Mother   . Healthy Sister    History  Substance Use Topics  . Smoking status: Former Smoker    Types: Cigarettes, Cigars    Quit date: 01/25/2009  . Smokeless tobacco: Never Used  . Alcohol Use: No    Review of Systems  HENT: Positive  for nosebleeds.   Respiratory: Negative for cough and shortness of breath.   Gastrointestinal: Negative for nausea and vomiting.    Allergies  Review of patient's allergies indicates no known allergies.  Home Medications   Current Outpatient Rx  Name  Route  Sig  Dispense  Refill  . aspirin EC 81 MG tablet   Oral   Take 81 mg by mouth daily.         Marland Kitchen losartan (COZAAR) 50 MG tablet   Oral   Take 50 mg by mouth daily.         . mirabegron ER (MYRBETRIQ) 25 MG TB24   Oral   Take 25 mg by mouth daily.         . nebivolol (BYSTOLIC) 5 MG tablet   Oral   Take 1 tablet (5 mg total) by mouth daily.   30 tablet   6   . pantoprazole (PROTONIX) 40 MG tablet   Oral   Take 1 tablet (40 mg total) by mouth daily.   30 tablet   11   . prasugrel (EFFIENT) 10 MG TABS tablet   Oral   Take 1 tablet (10 mg total) by mouth daily.   30 tablet   10   . rosuvastatin (CRESTOR) 5 MG tablet   Oral   Take 1 tablet (5 mg total) by mouth daily after supper.   30 tablet  11   . nitroGLYCERIN (NITROSTAT) 0.4 MG SL tablet   Sublingual   Place 1 tablet (0.4 mg total) under the tongue every 5 (five) minutes as needed. For chest pain   25 tablet   11    BP 156/88  Pulse 73  Temp(Src) 98.9 F (37.2 C) (Oral)  Resp 17  SpO2 98% Physical Exam  Constitutional: He appears well-developed and well-nourished. No distress.  HENT:  Head: Normocephalic and atraumatic.  Mouth/Throat: No oropharyngeal exudate.  R nare with blood crust - no active bleeding, clear nares otherwise.  Septum normal.  OP clear  Eyes: Conjunctivae are normal. Right eye exhibits no discharge. Left eye exhibits no discharge.  Cardiovascular: Normal rate and regular rhythm.   Neurological: He is alert. Coordination normal.  Skin: Skin is warm and dry. No rash noted. No erythema.    ED Course  Procedures (including critical care time) Labs Review Labs Reviewed - No data to display Imaging Review No results  found.  EKG Interpretation   None       MDM   1. Anterior epistaxis     Pt has a resolved nose bleed at this time - he has no active bleeding, no posterior bleeding and pt will be given afrin - will take it home and will use twice daily for 3 days - I have also encouraged him to use topical abx or vaseline for a surface lube.  Understanding expressed.  Meds given in ED:  Medications  oxymetazoline (AFRIN) 0.05 % nasal spray 1 spray (1 spray Right Nare Given 10/08/13 1913)    Discharge Medication List as of 10/08/2013  7:11 PM          Vida Roller, MD 10/08/13 2245

## 2013-12-12 ENCOUNTER — Other Ambulatory Visit: Payer: Self-pay | Admitting: *Deleted

## 2013-12-12 MED ORDER — NEBIVOLOL HCL 5 MG PO TABS: 5.0000 mg | ORAL_TABLET | Freq: Every day | ORAL | Status: DC

## 2014-06-12 ENCOUNTER — Ambulatory Visit: Payer: PRIVATE HEALTH INSURANCE

## 2014-06-12 ENCOUNTER — Other Ambulatory Visit: Payer: Self-pay | Admitting: Occupational Medicine

## 2014-06-12 DIAGNOSIS — Z0000 Encounter for general adult medical examination without abnormal findings: Secondary | ICD-10-CM

## 2014-06-19 ENCOUNTER — Other Ambulatory Visit: Payer: Self-pay | Admitting: Cardiovascular Disease

## 2014-06-19 NOTE — Telephone Encounter (Signed)
Rx was sent to pharmacy electronically. 

## 2014-07-07 ENCOUNTER — Other Ambulatory Visit: Payer: Self-pay | Admitting: Cardiovascular Disease

## 2014-07-09 NOTE — Telephone Encounter (Signed)
Rx was sent to pharmacy electronically. 

## 2014-07-10 ENCOUNTER — Ambulatory Visit: Payer: BC Managed Care – PPO | Admitting: Cardiovascular Disease

## 2014-08-06 ENCOUNTER — Ambulatory Visit (INDEPENDENT_AMBULATORY_CARE_PROVIDER_SITE_OTHER): Payer: BC Managed Care – PPO | Admitting: Cardiovascular Disease

## 2014-08-06 ENCOUNTER — Encounter: Payer: Self-pay | Admitting: Cardiovascular Disease

## 2014-08-06 VITALS — BP 140/93 | HR 71 | Ht 70.0 in | Wt 317.7 lb

## 2014-08-06 DIAGNOSIS — E785 Hyperlipidemia, unspecified: Secondary | ICD-10-CM

## 2014-08-06 DIAGNOSIS — I2510 Atherosclerotic heart disease of native coronary artery without angina pectoris: Secondary | ICD-10-CM

## 2014-08-06 DIAGNOSIS — I2584 Coronary atherosclerosis due to calcified coronary lesion: Secondary | ICD-10-CM

## 2014-08-06 DIAGNOSIS — I10 Essential (primary) hypertension: Secondary | ICD-10-CM

## 2014-08-06 NOTE — Patient Instructions (Signed)
Your physician wants you to follow-up in: 1 year with Dr Berry. You will receive a reminder letter in the mail two months in advance. If you don't receive a letter, please call our office to schedule the follow-up appointment.  

## 2014-08-06 NOTE — Progress Notes (Signed)
08/06/2014 Antonio Colon   1951-09-04  [id number]  Primary Physician HAWKINS,EDWARD Carlean Jews, MD Primary Cardiologist: Lorretta Harp MD Renae Gloss   HPI:  The patient is a 63 year old, moderate to severely overweight, married Caucasian male with no children, who works as a Therapist, nutritional. I last saw him 12 months ago. He has a history of CAD status post LAD stenting using an Endeavor drug eluting stent (3 x 18) by Dr. Aldona Bar in November 2010. He had normal circumflex and RCA at that time. His other problems include: Remote tobacco abuse, treated hypertension, and dyslipidemia. He saw Dr. Mali Hilty in the office on January 21, 2012, complaining of chest pain, and a Myoview stress test performed a week later showed no ischemia. He does have a history of GERD. He has had no recurrent symptoms.Dr. Luan Pulling follows his lipid profile closely. He has gained over 20 pounds in the last year over 50 pounds and left ears. I have brought this to his attention.    Current Outpatient Prescriptions  Medication Sig Dispense Refill  . aspirin EC 81 MG tablet Take 81 mg by mouth daily.      Marland Kitchen BYSTOLIC 5 MG tablet TAKE 1 TABLET BY MOUTH ONCE A DAY.  30 tablet  0  . CRESTOR 5 MG tablet TAKE 1 TABLET BY MOUTH AT BEDTIME FOR CHOLESTEROL.  30 tablet  1  . losartan (COZAAR) 50 MG tablet Take 50 mg by mouth daily.      . mirabegron ER (MYRBETRIQ) 25 MG TB24 Take 25 mg by mouth daily.      . nitroGLYCERIN (NITROSTAT) 0.4 MG SL tablet Place 1 tablet (0.4 mg total) under the tongue every 5 (five) minutes as needed. For chest pain  25 tablet  11  . pantoprazole (PROTONIX) 40 MG tablet Take 1 tablet (40 mg total) by mouth daily.  30 tablet  11  . prasugrel (EFFIENT) 10 MG TABS tablet Take 1 tablet (10 mg total) by mouth daily.  30 tablet  10   No current facility-administered medications for this visit.    No Known Allergies  History   Social History  . Marital Status: Married    Spouse  Name: N/A    Number of Children: N/A  . Years of Education: N/A   Occupational History  . Not on file.   Social History Main Topics  . Smoking status: Former Smoker    Types: Cigarettes, Cigars    Quit date: 01/25/2009  . Smokeless tobacco: Never Used  . Alcohol Use: No  . Drug Use: No  . Sexual Activity: Not on file   Other Topics Concern  . Not on file   Social History Narrative  . No narrative on file     Review of Systems: General: negative for chills, fever, night sweats or weight changes.  Cardiovascular: negative for chest pain, dyspnea on exertion, edema, orthopnea, palpitations, paroxysmal nocturnal dyspnea or shortness of breath Dermatological: negative for rash Respiratory: negative for cough or wheezing Urologic: negative for hematuria Abdominal: negative for nausea, vomiting, diarrhea, bright red blood per rectum, melena, or hematemesis Neurologic: negative for visual changes, syncope, or dizziness All other systems reviewed and are otherwise negative except as noted above.    Blood pressure 140/93, pulse 71, height 5\' 10"  (1.778 m), weight 317 lb 11.2 oz (144.108 kg).  General appearance: alert and no distress Neck: no adenopathy, no carotid bruit, no JVD, supple, symmetrical, trachea midline and thyroid not  enlarged, symmetric, no tenderness/mass/nodules Lungs: clear to auscultation bilaterally Heart: regular rate and rhythm, S1, S2 normal, no murmur, click, rub or gallop Extremities: extremities normal, atraumatic, no cyanosis or edema  EKG Normal sinus rhythm at 71 without ST or T wave changes  ASSESSMENT AND PLAN:   CAD (coronary artery disease) History of watery artery disease status post stenting of his LAD with drug-eluting stent by Dr. Rex Kras November 2010 (endeavor to 3 mm x 18 drug-eluting stent). He had a normal circumflex and right coronary artery at that time. He denies chest pain or shortness of breath. A Myoview stress test performed January  2012 showed no ischemia.  Hyperlipemia On statin therapy followed by his PCP  HTN (hypertension) Controlled on current medications      Lorretta Harp MD [facility], [facility] 08/06/2014 9:32 AM

## 2014-08-06 NOTE — Assessment & Plan Note (Signed)
Controlled on current medications 

## 2014-08-06 NOTE — Assessment & Plan Note (Signed)
History of watery artery disease status post stenting of his LAD with drug-eluting stent by Dr. Rex Kras November 2010 (endeavor to 3 mm x 18 drug-eluting stent). He had a normal circumflex and right coronary artery at that time. He denies chest pain or shortness of breath. A Myoview stress test performed January 2012 showed no ischemia.

## 2014-08-06 NOTE — Assessment & Plan Note (Signed)
On statin therapy followed by his PCP 

## 2014-08-08 ENCOUNTER — Other Ambulatory Visit: Payer: Self-pay | Admitting: *Deleted

## 2014-08-08 MED ORDER — NEBIVOLOL HCL 5 MG PO TABS: ORAL_TABLET | ORAL | Status: DC

## 2014-08-08 NOTE — Telephone Encounter (Signed)
Rx was sent to pharmacy electronically. 

## 2014-08-15 ENCOUNTER — Other Ambulatory Visit: Payer: Self-pay | Admitting: *Deleted

## 2014-08-15 MED ORDER — PRASUGREL HCL 10 MG PO TABS: 10.0000 mg | ORAL_TABLET | Freq: Every day | ORAL | Status: DC

## 2014-08-20 ENCOUNTER — Other Ambulatory Visit: Payer: Self-pay

## 2014-08-20 MED ORDER — ROSUVASTATIN CALCIUM 5 MG PO TABS: 5.0000 mg | ORAL_TABLET | Freq: Every day | ORAL | Status: DC

## 2014-08-20 NOTE — Telephone Encounter (Signed)
Rx was sent to pharmacy electronically. 

## 2014-09-17 ENCOUNTER — Other Ambulatory Visit: Payer: Self-pay | Admitting: *Deleted

## 2014-09-17 MED ORDER — PANTOPRAZOLE SODIUM 40 MG PO TBEC: 40.0000 mg | DELAYED_RELEASE_TABLET | Freq: Every day | ORAL | Status: DC

## 2014-09-17 NOTE — Telephone Encounter (Signed)
Rx was sent to pharmacy electronically. 

## 2015-04-04 ENCOUNTER — Ambulatory Visit (INDEPENDENT_AMBULATORY_CARE_PROVIDER_SITE_OTHER): Payer: BLUE CROSS/BLUE SHIELD | Admitting: Otolaryngology

## 2015-04-04 DIAGNOSIS — R040 Epistaxis: Secondary | ICD-10-CM | POA: Diagnosis not present

## 2015-04-14 ENCOUNTER — Emergency Department (HOSPITAL_COMMUNITY)
Admission: EM | Admit: 2015-04-14 | Discharge: 2015-04-15 | Disposition: A | Discharge status: Home / Self Care | Payer: BLUE CROSS/BLUE SHIELD | Attending: Emergency Medicine | Admitting: Emergency Medicine

## 2015-04-14 ENCOUNTER — Encounter (HOSPITAL_COMMUNITY): Payer: Self-pay | Admitting: *Deleted

## 2015-04-14 DIAGNOSIS — Z87891 Personal history of nicotine dependence: Secondary | ICD-10-CM | POA: Insufficient documentation

## 2015-04-14 DIAGNOSIS — E785 Hyperlipidemia, unspecified: Secondary | ICD-10-CM | POA: Diagnosis not present

## 2015-04-14 DIAGNOSIS — Z7982 Long term (current) use of aspirin: Secondary | ICD-10-CM | POA: Diagnosis not present

## 2015-04-14 DIAGNOSIS — I2510 Atherosclerotic heart disease of native coronary artery without angina pectoris: Secondary | ICD-10-CM | POA: Insufficient documentation

## 2015-04-14 DIAGNOSIS — R040 Epistaxis: Secondary | ICD-10-CM | POA: Diagnosis present

## 2015-04-14 DIAGNOSIS — K219 Gastro-esophageal reflux disease without esophagitis: Secondary | ICD-10-CM | POA: Diagnosis not present

## 2015-04-14 DIAGNOSIS — I10 Essential (primary) hypertension: Secondary | ICD-10-CM | POA: Insufficient documentation

## 2015-04-14 MED ORDER — OXYMETAZOLINE HCL 0.05 % NA SOLN
1.0000 | Freq: Once | NASAL | Status: AC
  Administered 2015-04-14: 1 via NASAL
  Filled 2015-04-14: qty 15

## 2015-04-14 NOTE — Discharge Instructions (Signed)
Nosebleed °A nosebleed can be caused by many things, including: °· Getting hit hard in the nose. °· Infections. °· Dry nose. °· Colds. °· Medicines. °Your doctor may do lab testing if you get nosebleeds a lot and the cause is not known. °HOME CARE  °· If your nose was packed with material, keep it there until your doctor takes it out. Put the pack back in your nose if the pack falls out. °· Do not blow your nose for 12 hours after the nosebleed. °· Sit up and bend forward if your nose starts bleeding again. Pinch the front half of your nose nonstop for 20 minutes. °· Put petroleum jelly inside your nose every morning if you have a dry nose. °· Use a humidifier to make the air less dry. °· Do not take aspirin. °· Try not to strain, lift, or bend at the waist for many days after the nosebleed. °GET HELP RIGHT AWAY IF:  °· Nosebleeds keep happening and are hard to stop or control. °· You have bleeding or bruises that are not normal on other parts of the body. °· You have a fever. °· The nosebleeds get worse. °· You get lightheaded, feel faint, sweaty, or throw up (vomit) blood. °MAKE SURE YOU:  °· Understand these instructions. °· Will watch your condition. °· Will get help right away if you are not doing well or get worse. °Document Released: 09/22/2008 Document Revised: 03/07/2012 Document Reviewed: 09/22/2008 °Exit[employer]® Patient Information ©2015 [employer]. This information is not intended to replace advice given to you by your health care provider. Make sure you discuss any questions you have with your health care provider. ° °

## 2015-04-14 NOTE — ED Provider Notes (Signed)
CSN: [id number]     Arrival date & time 04/14/15  2254 History  This chart was scribed for Ripley Fraise, MD by Randa Evens, ED Scribe. This patient was seen in room APA18/APA18 and the patient's care was started at 11:10 PM.     Chief Complaint  Patient presents with  . Epistaxis   The history is provided by the patient. No language interpreter was used.   HPI Comments: Antonio Colon is a 64 y.o. male who presents to the Emergency Department complaining of epistaxis onset tonight. Pt states that this episode was intermittent or about 10 minutes. Pt states that he clogged his nose that has provided relief and the bleeding is now controlled. Pt states that this morning he did blow his nose and noticed a scab come out but states that it didn't start bleeding tonight when he went to bed. Pt states that he is on blood thinners (effient). Pt denies injury or trauma to his nose. Denies fever, vomiting, HA, weakness, or dizziness. Pt states that he was evaluated by ENT 10 days ago and had his nose cauterized. Pt states that he was told his blood vessels are close to the surface and are the possible cause for his frequent nose bleeds.   Past Medical History  Diagnosis Date  . Hypertension   . Hypercholesteremia   . Heart disease   . CAD (coronary artery disease)     stents  . Hyperlipidemia   . GERD (gastroesophageal reflux disease)   . History of stress test 01/27/2012    Normal study. No significant ischemia demonstration, this low risk scan, no significant change compared to previous study.   Past Surgical History  Procedure Laterality Date  . Carotid stent    . Colonoscopy  06/15/2012  . Cardiac catheterization  10/2009    CAD stents to the LAD using an Endeavor drug eluting (3x19mm) by Dr Rex Kras, he had normal circumflex and RCA at the time.   Family History  Problem Relation Age of Onset  . Heart disease Mother   . Healthy Sister    History  Substance Use Topics  . Smoking  status: Former Smoker    Types: Cigarettes, Cigars    Quit date: 01/25/2009  . Smokeless tobacco: Never Used  . Alcohol Use: No    Review of Systems  Constitutional: Negative for fever.  HENT: Positive for nosebleeds.   Gastrointestinal: Negative for vomiting.  Neurological: Negative for dizziness, weakness and headaches.  All other systems reviewed and are negative.     Allergies  Review of patient's allergies indicates no known allergies.  Home Medications   Prior to Admission medications   Medication Sig Start Date End Date Taking? Authorizing Provider  aspirin EC 81 MG tablet Take 81 mg by mouth daily.    Historical Provider, MD  losartan (COZAAR) 50 MG tablet Take 50 mg by mouth daily.    Historical Provider, MD  mirabegron ER (MYRBETRIQ) 25 MG TB24 Take 25 mg by mouth daily.    Historical Provider, MD  nebivolol (BYSTOLIC) 5 MG tablet TAKE 1 TABLET BY MOUTH ONCE A DAY. 08/08/14   Lorretta Harp, MD  nitroGLYCERIN (NITROSTAT) 0.4 MG SL tablet Place 1 tablet (0.4 mg total) under the tongue every 5 (five) minutes as needed. For chest pain 07/05/13   Lorretta Harp, MD  pantoprazole (PROTONIX) 40 MG tablet Take 1 tablet (40 mg total) by mouth daily. 09/17/14   Lorretta Harp, MD  prasugrel (EFFIENT)  10 MG TABS tablet Take 1 tablet (10 mg total) by mouth daily. 08/15/14   Lorretta Harp, MD  rosuvastatin (CRESTOR) 5 MG tablet Take 1 tablet (5 mg total) by mouth at bedtime. 08/20/14   Lorretta Harp, MD   BP 167/77 mmHg  Pulse 67  Temp(Src) 97.6 F (36.4 C) (Oral)  Resp 16  Ht 5\' 9"  (1.753 m)  Wt 320 lb (145.151 kg)  BMI 47.23 kg/m2  SpO2 98%   Physical Exam  Nursing note and vitals reviewed.  CONSTITUTIONAL: Well developed/well nourished HEAD: Normocephalic/atraumatic EYES: EOMI/PERRL ENMT: Mucous membranes moist, dried blood noted to right septum no active bleeding.  NECK: supple no meningeal signs SPINE/BACK:entire spine nontender CV: S1/S2 noted, no  murmurs/rubs/gallops noted LUNGS: Lungs are clear to auscultation bilaterally, no apparent distress ABDOMEN: soft, nontender, no rebound or guarding, bowel sounds noted throughout abdomen NEURO: Pt is awake/alert/appropriate, moves all extremitiesx4.  No facial droop.   EXTREMITIES: pulses normal/equal, full ROM SKIN: warm, color normal PSYCH: no abnormalities of mood noted, alert and oriented to situation   ED Course  Procedures  DIAGNOSTIC STUDIES: Oxygen Saturation is 98% on RA, normal by my interpretation.    COORDINATION OF CARE: 11:18 PM-Discussed treatment plan with pt at bedside and pt agreed to plan.   Medications  oxymetazoline (AFRIN) 0.05 % nasal spray 1 spray (1 spray Right Nare Given 04/14/15 2329)    I advised patient that possibility of re-bleeding is likely Offered nasal packing (he just had cautery by ENT) He elects to defer packing will try afrin and f/u with ENT    MDM   Final diagnoses:  Anterior epistaxis    Nursing notes including past medical history and social history reviewed and considered in documentation  I personally performed the services described in this documentation, which was scribed in my presence. The recorded information has been reviewed and is accurate.      Ripley Fraise, MD 04/15/15 [phone]

## 2015-04-14 NOTE — ED Notes (Signed)
Pt c/o bleeding to right side of nose; pt states he was seen by ENT doctor 1 1/2 weeks ago and had it cauterized; pt states when he woke up this am to blow his nose he noticed a scab come out and then tonight when he went to bed he had an occurrence of his nose bleeding; bleeding is controlled at this time

## 2015-05-02 ENCOUNTER — Ambulatory Visit (INDEPENDENT_AMBULATORY_CARE_PROVIDER_SITE_OTHER): Payer: BLUE CROSS/BLUE SHIELD | Admitting: Otolaryngology

## 2015-05-02 DIAGNOSIS — R040 Epistaxis: Secondary | ICD-10-CM | POA: Diagnosis not present

## 2015-05-03 ENCOUNTER — Telehealth: Payer: Self-pay | Admitting: Cardiovascular Disease

## 2015-05-03 NOTE — Telephone Encounter (Signed)
Pts wife called and stated he her husband was having nose bleeds on a frequent basis , saw the ENT yesterday and had his nose cauterized. I Instructed her to call the ENT back and get some direction, pt.s wife agreed with plan

## 2015-05-03 NOTE — Telephone Encounter (Signed)
Please call,pt have been having real bad nose bleeds.

## 2015-07-08 ENCOUNTER — Other Ambulatory Visit: Payer: Self-pay | Admitting: Cardiovascular Disease

## 2015-07-10 ENCOUNTER — Other Ambulatory Visit: Payer: Self-pay | Admitting: Cardiovascular Disease

## 2015-07-10 NOTE — Telephone Encounter (Signed)
REFILL 

## 2015-07-22 ENCOUNTER — Other Ambulatory Visit: Payer: Self-pay | Admitting: Cardiovascular Disease

## 2015-08-05 ENCOUNTER — Ambulatory Visit: Payer: Self-pay

## 2015-08-05 ENCOUNTER — Other Ambulatory Visit: Payer: Self-pay | Admitting: Occupational Medicine

## 2015-08-05 DIAGNOSIS — Z0000 Encounter for general adult medical examination without abnormal findings: Secondary | ICD-10-CM

## 2015-08-13 ENCOUNTER — Ambulatory Visit (INDEPENDENT_AMBULATORY_CARE_PROVIDER_SITE_OTHER): Payer: BLUE CROSS/BLUE SHIELD | Admitting: Cardiovascular Disease

## 2015-08-13 ENCOUNTER — Encounter: Payer: Self-pay | Admitting: Cardiovascular Disease

## 2015-08-13 VITALS — BP 122/80 | HR 72 | Ht 69.0 in | Wt 327.1 lb

## 2015-08-13 DIAGNOSIS — E785 Hyperlipidemia, unspecified: Secondary | ICD-10-CM

## 2015-08-13 DIAGNOSIS — I10 Essential (primary) hypertension: Secondary | ICD-10-CM

## 2015-08-13 DIAGNOSIS — I2510 Atherosclerotic heart disease of native coronary artery without angina pectoris: Secondary | ICD-10-CM | POA: Diagnosis not present

## 2015-08-13 DIAGNOSIS — I2583 Coronary atherosclerosis due to lipid rich plaque: Secondary | ICD-10-CM

## 2015-08-13 NOTE — Assessment & Plan Note (Signed)
History of coronary artery disease status post LAD stenting using an Endeavor drug-eluting stent (3 mm x 18 L) by Dr. Carlean Jews little November 2010. He had normal circumflex and RCA at that time. A Myoview performed 01/21/12 showed no ischemia. He denies chest pain or shortness of breath.

## 2015-08-13 NOTE — Assessment & Plan Note (Signed)
History of hypertension blood pressure measured at 122/80. He is on Bystolic, and losartan. Continue current meds at current dosing

## 2015-08-13 NOTE — Progress Notes (Signed)
08/13/2015 MARIA GALLICCHIO   1951/03/25  [id number]  Primary Physician HAWKINS,EDWARD Carlean Jews, MD Primary Cardiologist: Lorretta Harp MD Renae Gloss   HPI:  The patient is a 64 year old, moderate to severely overweight, married Caucasian male with no children, who works as a Therapist, nutritional.  I last saw him 12 months ago. He has a history of CAD status post LAD stenting using an Endeavor drug eluting stent (3 x 18) by Dr. Aldona Bar in November 2010. He had normal circumflex and RCA at that time. His other problems include: Remote tobacco abuse, treated hypertension, and dyslipidemia. He saw Dr. Mali Hilty in the office on January 21, 2012, complaining of chest pain, and a Myoview stress test performed a week later showed no ischemia. He does have a history of GERD. He has had no recurrent symptoms.Dr. Luan Pulling follows his lipid profile closely. He has gained over 20 pounds in the last year over 50 pounds over the last 2 years.. I have brought this to his attention.   Current Outpatient Prescriptions  Medication Sig Dispense Refill  . aspirin EC 81 MG tablet Take 81 mg by mouth daily.    Marland Kitchen BYSTOLIC 5 MG tablet TAKE 1 TABLET BY MOUTH ONCE A DAY. 30 tablet 1  . losartan (COZAAR) 50 MG tablet Take 50 mg by mouth daily.    . mirabegron ER (MYRBETRIQ) 25 MG TB24 Take 25 mg by mouth daily.    . nitroGLYCERIN (NITROSTAT) 0.4 MG SL tablet Place 1 tablet (0.4 mg total) under the tongue every 5 (five) minutes as needed. For chest pain 25 tablet 11  . pantoprazole (PROTONIX) 40 MG tablet TAKE 1 TABLET BY MOUTH ONCE A DAY. 30 tablet 0  . rosuvastatin (CRESTOR) 5 MG tablet TAKE 1 TABLET BY MOUTH AT BEDTIME FOR CHOLESTEROL. 30 tablet 0  . ALPRAZolam (XANAX) 0.25 MG tablet Take 1 tablet by mouth daily. Take 1 tab daily    . fluorometholone (FML) 0.1 % ophthalmic suspension Place 1 drop into both eyes 2 (two) times daily. Instill 1 drop in each eye twice a day     No current  facility-administered medications for this visit.    No Known Allergies  Social History   Social History  . Marital Status: Married    Spouse Name: N/A  . Number of Children: N/A  . Years of Education: N/A   Occupational History  . Not on file.   Social History Main Topics  . Smoking status: Former Smoker    Types: Cigarettes, Cigars    Quit date: 01/25/2009  . Smokeless tobacco: Never Used  . Alcohol Use: No  . Drug Use: No  . Sexual Activity: Not on file   Other Topics Concern  . Not on file   Social History Narrative     Review of Systems: General: negative for chills, fever, night sweats or weight changes.  Cardiovascular: negative for chest pain, dyspnea on exertion, edema, orthopnea, palpitations, paroxysmal nocturnal dyspnea or shortness of breath Dermatological: negative for rash Respiratory: negative for cough or wheezing Urologic: negative for hematuria Abdominal: negative for nausea, vomiting, diarrhea, bright red blood per rectum, melena, or hematemesis Neurologic: negative for visual changes, syncope, or dizziness All other systems reviewed and are otherwise negative except as noted above.    Blood pressure 122/80, pulse 72, height 5\' 9"  (1.753 m), weight 327 lb 2 oz (148.383 kg).  General appearance: alert and no distress Neck: no adenopathy, no carotid bruit, no  JVD, supple, symmetrical, trachea midline and thyroid not enlarged, symmetric, no tenderness/mass/nodules Lungs: clear to auscultation bilaterally Heart: regular rate and rhythm, S1, S2 normal, no murmur, click, rub or gallop Extremities: extremities normal, atraumatic, no cyanosis or edema  EKG Reconstruction of 72 without ST or T-wave changes. I personally reviewed this EKG  ASSESSMENT AND PLAN:   Hyperlipemia History of hyperlipidemia on Crestor 5 mg a day followed by his PCP  HTN (hypertension) History of hypertension blood pressure measured at 122/80. He is on Bystolic, and  losartan. Continue current meds at current dosing  CAD (coronary artery disease) History of coronary artery disease status post LAD stenting using an Endeavor drug-eluting stent (3 mm x 18 L) by Dr. Carlean Jews little November 2010. He had normal circumflex and RCA at that time. A Myoview performed 01/21/12 showed no ischemia. He denies chest pain or shortness of breath.      Lorretta Harp MD FACP,FACC,FAHA, [facility] 08/13/2015 9:41 AM

## 2015-08-13 NOTE — Assessment & Plan Note (Signed)
History of hyperlipidemia on Crestor 5 mg a day followed by his PCP 

## 2015-08-13 NOTE — Patient Instructions (Signed)
Dr Gwenlyn Found has referred to see Antonio Colon, a registered dietician. Please see information provided.  Dr Gwenlyn Found recommends that you schedule a follow-up appointment in 1 year. You will receive a reminder letter in the mail two months in advance. If you don't receive a letter, please call our office to schedule the follow-up appointment.

## 2015-09-21 ENCOUNTER — Other Ambulatory Visit: Payer: Self-pay | Admitting: Cardiovascular Disease

## 2015-10-05 ENCOUNTER — Other Ambulatory Visit: Payer: Self-pay | Admitting: Cardiovascular Disease

## 2015-10-07 NOTE — Telephone Encounter (Signed)
REFILL 

## 2015-10-25 ENCOUNTER — Other Ambulatory Visit (HOSPITAL_COMMUNITY): Payer: Self-pay | Admitting: Pulmonary Disease

## 2015-10-25 DIAGNOSIS — R1013 Epigastric pain: Secondary | ICD-10-CM

## 2015-10-28 ENCOUNTER — Ambulatory Visit (HOSPITAL_COMMUNITY): Payer: BLUE CROSS/BLUE SHIELD

## 2015-10-28 ENCOUNTER — Ambulatory Visit (HOSPITAL_COMMUNITY)
Admission: RE | Admit: 2015-10-28 | Discharge: 2015-10-28 | Disposition: A | Discharge status: Home / Self Care | Payer: PRIVATE HEALTH INSURANCE | Source: Ambulatory Visit | Attending: Pulmonary Disease | Admitting: Pulmonary Disease

## 2015-10-28 DIAGNOSIS — K5730 Diverticulosis of large intestine without perforation or abscess without bleeding: Secondary | ICD-10-CM | POA: Insufficient documentation

## 2015-10-28 DIAGNOSIS — R1013 Epigastric pain: Secondary | ICD-10-CM

## 2015-10-28 DIAGNOSIS — R109 Unspecified abdominal pain: Secondary | ICD-10-CM | POA: Insufficient documentation

## 2015-10-28 LAB — POCT I-STAT CREATININE: Creatinine, Ser: 1 mg/dL (ref 0.61–1.24)

## 2015-10-28 MED ORDER — SODIUM CHLORIDE 0.9 % IJ SOLN
INTRAMUSCULAR | Status: AC
  Filled 2015-10-28: qty 60

## 2015-10-28 MED ORDER — SODIUM CHLORIDE 0.9 % IJ SOLN
INTRAMUSCULAR | Status: AC
  Filled 2015-10-28: qty 1000

## 2015-10-28 MED ORDER — IOHEXOL 300 MG/ML  SOLN
100.0000 mL | Freq: Once | INTRAMUSCULAR | Status: AC | PRN
  Administered 2015-10-28: 100 mL via INTRAVENOUS

## 2015-11-07 ENCOUNTER — Encounter (INDEPENDENT_AMBULATORY_CARE_PROVIDER_SITE_OTHER): Payer: Self-pay | Admitting: *Deleted

## 2015-11-07 ENCOUNTER — Ambulatory Visit (INDEPENDENT_AMBULATORY_CARE_PROVIDER_SITE_OTHER): Payer: PRIVATE HEALTH INSURANCE | Admitting: Internal Medicine

## 2015-11-07 ENCOUNTER — Encounter (INDEPENDENT_AMBULATORY_CARE_PROVIDER_SITE_OTHER): Payer: Self-pay | Admitting: Internal Medicine

## 2015-11-07 ENCOUNTER — Other Ambulatory Visit (INDEPENDENT_AMBULATORY_CARE_PROVIDER_SITE_OTHER): Payer: Self-pay | Admitting: Internal Medicine

## 2015-11-07 VITALS — BP 130/70 | HR 72 | Temp 97.4°F | Ht 69.0 in | Wt 315.6 lb

## 2015-11-07 DIAGNOSIS — R110 Nausea: Secondary | ICD-10-CM

## 2015-11-07 DIAGNOSIS — R634 Abnormal weight loss: Secondary | ICD-10-CM

## 2015-11-07 DIAGNOSIS — R1013 Epigastric pain: Secondary | ICD-10-CM

## 2015-11-07 MED ORDER — PANTOPRAZOLE SODIUM 40 MG PO TBEC: 40.0000 mg | DELAYED_RELEASE_TABLET | Freq: Two times a day (BID) | ORAL | Status: DC

## 2015-11-07 NOTE — Progress Notes (Addendum)
Subjective:    Patient ID: Antonio Colon, male    DOB: 23-Dec-1951, 64 y.o.   MRN: [id number] PCP: Dr. Luan Pulling. HPI Referred to our office for abdominal pain/loss of appetite. He tells me he has pain in his epigastric pain. He has no appetite. He has diarrhea. He has back pain and pain between his shoulders. In the last couple of weeks he has lost about 10-15 pounds.  When he eats he becomes nauseated and the pain increases. It does not matter what he eats, the pain increases. He has had the pain for about 2 weeks. He is having a BM x 1 a day and it is loose. Stools have been dark but not black.  He has not been on any recent antibiotics. He says he does not get hungry. He denies any acid reflux.  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 No feer.  Nausea but no vomiting.  No NSAIDS except ASA 81 mg   10/24/2015 WBC 7.1, H and H 16.7 and 49.3, MCV 97, Platelet ct 238. Total protein 6.4, Albumin 4.21, total bili 1.1, ALP 95, AST 17, ALT 24, Amylase 120, Lipase 44, H. Pylori negative.  10/28/2015 CT abdomen/pelvis with CM: Abdominal pain x 1 week: No acute abnormalities. Slight diverticulosis of the left side of coon. Slight progression of degenerative facet arthritis in the lower lumber spine.       06/15/2012 Colonoscopy  Indications: Patient is 64 year old Caucasian male was undergoing average risk screening colonoscopy. Impression:  Normal terminal ileum and normal colonoscopy.   Discharge Date: 05/24/2002       05/24/2002: Esophagogastroduodenoscopy, followed by total colonoscopy.  INDICATIONS: This is a 64 year old Caucasian male patient of Dr. 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 He recently had an episode of bleeding. When I saw him in the office, a stool was guaiac-positive. He also complains of recurrent epigastric pain. Stool cultures and C. difficile toxin  titer were negative. O&P was also negative. He did have white cells in his stool. I reviewed the record from [facility] and noted that EGD had suggested esophageal varices. He is not aware of this diagnosis. There is no history of liver disease. I, therefore, recommended that EGD be performed, along with colonoscopy to find if indeed he has varices, in which case he will need further evaluation. Both procedures were reviewed with the patient, and an informed consent was obtained.  FINAL DIAGNOSES: 1. Erosive reflux esophagitis. 2. 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 3. Diffuse gastritis, which is possibly mild portal gastropathy but need to  rule out Helicobacter pylori infection. 4. No endoscopic evidence of colitis or submucosal edema noted at sigmoid  colon. Tiny polyp at rectum, which obliterated by cold biopsy.  08/06/2014 Liver Biopsy: Mild inflammation and a few degenerated hepatocytes.  Findings consistent with mild steatohepatitis.   Review of Systems Past Medical History  Diagnosis Date  . Hypertension   . Hypercholesteremia   . Heart disease   . CAD (coronary artery disease)     stents  . Hyperlipidemia   . GERD (gastroesophageal reflux disease)   . History of stress test 01/27/2012    Normal study. No significant ischemia demonstration, this low risk scan, no significant change compared to previous study.  . Obesity     Past Surgical History  Procedure Laterality Date  . Carotid stent  x 6 yrs this month  . Colonoscopy  06/15/2012  . Cardiac catheterization  10/2009    CAD stents to the LAD using an Endeavor drug eluting (3x67mm) by Dr Rex Kras, he had normal circumflex and RCA at the time.    No Known Allergies  Current Outpatient Prescriptions on File Prior to Visit  Medication Sig Dispense Refill  . ALPRAZolam (XANAX) 0.25 MG tablet Take 1 tablet by mouth at bedtime. Take 1 tab daily    . aspirin EC 81 MG tablet Take 81 mg by  mouth daily.    Marland Kitchen BYSTOLIC 5 MG tablet TAKE 1 TABLET BY MOUTH ONCE A DAY. 30 tablet 11  . losartan (COZAAR) 50 MG tablet Take 50 mg by mouth daily.    . mirabegron ER (MYRBETRIQ) 25 MG TB24 Take 25 mg by mouth daily.    . nitroGLYCERIN (NITROSTAT) 0.4 MG SL tablet Place 1 tablet (0.4 mg total) under the tongue every 5 (five) minutes as needed. For chest pain 25 tablet 11  . pantoprazole (PROTONIX) 40 MG tablet TAKE 1 TABLET BY MOUTH ONCE A DAY. 30 tablet 0  . rosuvastatin (CRESTOR) 5 MG tablet TAKE 1 TABLET BY MOUTH AT BEDTIME FOR CHOLESTEROL. 30 tablet 10   No current facility-administered medications on file prior to visit.        Objective:   Physical Exam Blood pressure 130/70, pulse 72, temperature 97.4 F (36.3 C), height 5\' 9"  (1.753 m), weight 315 lb 9.6 oz (143.155 kg). Alert and oriented. Skin warm and dry. Oral mucosa is moist.   . Sclera anicteric, conjunctivae is pink. Thyroid not enlarged. No cervical lymphadenopathy. Lungs clear. Heart regular rate and rhythm.  Abdomen is soft. Bowel sounds are positive. No hepatomegaly. No abdominal masses felt. No tenderness.  No edema to lower extremities.   Stool brown and guaiac negative.   Lot [id number] Ex 9/17    Assessment & Plan:  Epigastric pain. ? PUD needs to be ruled. Increase Protonix to BID.  The weight loss could be stress related to losing his job. IF EGD normal, may consider GB disease.

## 2015-11-07 NOTE — Patient Instructions (Signed)
EGD

## 2015-11-14 ENCOUNTER — Encounter (INDEPENDENT_AMBULATORY_CARE_PROVIDER_SITE_OTHER): Payer: Self-pay

## 2015-11-15 ENCOUNTER — Encounter (HOSPITAL_COMMUNITY): Payer: Self-pay | Admitting: *Deleted

## 2015-11-15 ENCOUNTER — Ambulatory Visit (HOSPITAL_COMMUNITY)
Admission: RE | Admit: 2015-11-15 | Discharge: 2015-11-15 | Disposition: A | Discharge status: Home / Self Care | Payer: PRIVATE HEALTH INSURANCE | Source: Ambulatory Visit | Attending: Internal Medicine | Admitting: Internal Medicine

## 2015-11-15 ENCOUNTER — Encounter (HOSPITAL_COMMUNITY)
Admission: RE | Disposition: A | Discharge status: Home / Self Care | Payer: Self-pay | Source: Ambulatory Visit | Attending: Internal Medicine

## 2015-11-15 DIAGNOSIS — Z6841 Body Mass Index (BMI) 40.0 and over, adult: Secondary | ICD-10-CM | POA: Diagnosis not present

## 2015-11-15 DIAGNOSIS — Z79899 Other long term (current) drug therapy: Secondary | ICD-10-CM | POA: Insufficient documentation

## 2015-11-15 DIAGNOSIS — I119 Hypertensive heart disease without heart failure: Secondary | ICD-10-CM | POA: Insufficient documentation

## 2015-11-15 DIAGNOSIS — K317 Polyp of stomach and duodenum: Secondary | ICD-10-CM

## 2015-11-15 DIAGNOSIS — K219 Gastro-esophageal reflux disease without esophagitis: Secondary | ICD-10-CM | POA: Insufficient documentation

## 2015-11-15 DIAGNOSIS — Z7982 Long term (current) use of aspirin: Secondary | ICD-10-CM | POA: Diagnosis not present

## 2015-11-15 DIAGNOSIS — R1013 Epigastric pain: Secondary | ICD-10-CM

## 2015-11-15 DIAGNOSIS — R110 Nausea: Secondary | ICD-10-CM

## 2015-11-15 DIAGNOSIS — R634 Abnormal weight loss: Secondary | ICD-10-CM

## 2015-11-15 DIAGNOSIS — I2510 Atherosclerotic heart disease of native coronary artery without angina pectoris: Secondary | ICD-10-CM | POA: Insufficient documentation

## 2015-11-15 DIAGNOSIS — E7800 Pure hypercholesterolemia, unspecified: Secondary | ICD-10-CM | POA: Diagnosis not present

## 2015-11-15 DIAGNOSIS — K449 Diaphragmatic hernia without obstruction or gangrene: Secondary | ICD-10-CM

## 2015-11-15 DIAGNOSIS — E669 Obesity, unspecified: Secondary | ICD-10-CM | POA: Insufficient documentation

## 2015-11-15 HISTORY — PX: ESOPHAGOGASTRODUODENOSCOPY: SHX5428

## 2015-11-15 SURGERY — EGD (ESOPHAGOGASTRODUODENOSCOPY)
Anesthesia: Moderate Sedation

## 2015-11-15 MED ORDER — ASPIRIN EC 81 MG PO TBEC: 81.0000 mg | DELAYED_RELEASE_TABLET | Freq: Every day | ORAL | Status: DC

## 2015-11-15 MED ORDER — MIDAZOLAM HCL 5 MG/5ML IJ SOLN
INTRAMUSCULAR | Status: DC | PRN
  Administered 2015-11-15 (×3): 2 mg via INTRAVENOUS

## 2015-11-15 MED ORDER — MEPERIDINE HCL 50 MG/ML IJ SOLN
INTRAMUSCULAR | Status: DC | PRN
  Administered 2015-11-15 (×2): 25 mg via INTRAVENOUS

## 2015-11-15 MED ORDER — BUTAMBEN-TETRACAINE-BENZOCAINE 2-2-14 % EX AERO
INHALATION_SPRAY | CUTANEOUS | Status: DC | PRN
  Administered 2015-11-15: 2 via TOPICAL

## 2015-11-15 MED ORDER — SODIUM CHLORIDE 0.9 % IV SOLN
INTRAVENOUS | Status: DC
  Administered 2015-11-15: 1000 mL via INTRAVENOUS

## 2015-11-15 MED ORDER — MIDAZOLAM HCL 5 MG/5ML IJ SOLN
INTRAMUSCULAR | Status: AC
  Filled 2015-11-15: qty 10

## 2015-11-15 MED ORDER — MEPERIDINE HCL 50 MG/ML IJ SOLN
INTRAMUSCULAR | Status: AC
  Filled 2015-11-15: qty 1

## 2015-11-15 NOTE — Discharge Instructions (Signed)
Resume usual medications and diet. No driving for 24 hours. Upper abdominal ultrasound to be scheduled. Office will call.      Esophagogastroduodenoscopy, Care After Refer to this sheet in the next few weeks. These instructions provide you with information about caring for yourself after your procedure. Your health care provider may also give you more specific instructions. Your treatment has been planned according to current medical practices, but problems sometimes occur. Call your health care provider if you have any problems or questions after your procedure. WHAT TO EXPECT AFTER THE PROCEDURE After your procedure, it is typical to feel:  Soreness in your throat.  Pain with swallowing.  Sick to your stomach (nauseous).  Bloated.  Dizzy.  Fatigued. HOME CARE INSTRUCTIONS  Do not eat or drink anything until the numbing medicine (local anesthetic) has worn off and your gag reflex has returned. You will know that the local anesthetic has worn off when you can swallow comfortably.  Do not drive or operate machinery until directed by your health care provider.  Take medicines only as directed by your health care provider. SEEK MEDICAL CARE IF:   You cannot stop coughing.  You are not urinating at all or less than usual. SEEK IMMEDIATE MEDICAL CARE IF:  You have difficulty swallowing.  You cannot eat or drink.  You have worsening throat or chest pain.  You have dizziness or lightheadedness or you faint.  You have nausea or vomiting.  You have chills.  You have a fever.  You have severe abdominal pain.  You have black, tarry, or bloody stools.   This information is not intended to replace advice given to you by your health care provider. Make sure you discuss any questions you have with your health care provider.   Document Released: 11/30/2012 Document Revised: 01/04/2015 Document Reviewed: 11/30/2012 Elsevier Interactive Patient Education [employer].

## 2015-11-15 NOTE — Op Note (Signed)
EGD PROCEDURE REPORT  PATIENT:  Antonio Colon  MR#:  [id number] Birthdate:  02-Sep-1951, 64 y.o., male Endoscopist:  Dr. Rogene Houston, MD Referred By:  Dr. Rayne Du ref. provider found Procedure Date: 11/15/2015  Procedure:   EGD  Indications:  Patient is 64 year old Caucasian male who was chronic GERD and has been doing well until EPI. He's been having postprandial epigastric pain associated with nausea and he has lost 12 pounds. Symptoms started after he lost his job. Lab studies were unremarkable including serum amylase and lipase. H. pylori serology was negative. Abdominal CT was unremarkable except small cyst in left lobe of the liver. He is on low-dose aspirin. He is undergoing diagnostic EGD. Patient states he is having less pain since pantoprazole dose was doubled.            Informed Consent:  The risks, benefits, alternatives & imponderables which include, but are not limited to, bleeding, infection, perforation, drug reaction and potential missed lesion have been reviewed.  The potential for biopsy, lesion removal, esophageal dilation, etc. have also been discussed.  Questions have been answered.  All parties agreeable.  Please see history & physical in medical record for more information.  Medications:  Demerol 50 mg IV Versed 6 mg IV Cetacaine spray topically for oropharyngeal anesthesia  Description of procedure:  The endoscope was introduced through the mouth and advanced to the second portion of the duodenum without difficulty or limitations. The mucosal surfaces were surveyed very carefully during advancement of the scope and upon withdrawal.  Findings:  Esophagus:  Mucosa of the esophagus was normal. GE junction was unremarkable. No ring or stricture noted. GEJ:  41 cm Hiatus:  43 cm Stomach:  Stomach was empty and distended very well with insufflation. Folds in the proximal stomach were normal. Examination of mucosa revealed few small hyperplastic appearing polyps at proximal  gastric body. Antral mucosa was unremarkable. Pyloric channel was patent. Angularis was unremarkable. Scattered small hyperplastic appearing polyps are noted at gastric fundus. Duodenum:  Bulbar and post bulbar mucosa.  Therapeutic/Diagnostic Maneuvers Performed:  None  Complications:  None  EBL: None  Impression: Small sliding hiatal hernia without evidence of erosive esophagitis. Multiple small hyperplastic appearing polyps at gastric body and fundus. These were left alone. No evidence of peptic ulcer disease or gastritis.  Recommendations:  Standard instructions given. Continue pantoprazole at 40 mg by mouth twice a day. Upper abdominal ultrasound to be scheduled.  REHMAN,NAJEEB U  11/15/2015  2:58 PM  CC: Dr. Alonza Bogus, MD & Dr. Rayne Du ref. provider found

## 2015-11-15 NOTE — H&P (Signed)
Antonio Colon is an 64 y.o. male.   Chief Complaint: Antonio Colon is here for EGD. HPI: Antonio Colon is 64 year old Caucasian male was chronic GERD and examined during well with single dose PPI presented with 3 to four-week history of epigastric pain worse with meals nausea and he has lost 12 pounds. He takes low-dose aspirin but does not take other NSAIDs. He denies melena or rectal bleeding. He states symptoms started soon after he lost his job. He was seen in the office 8 days ago and pantoprazole doubled. He is feeling better. He is undergoing diagnostic EGD.  Past Medical History  Diagnosis Date  . Hypertension   . Hypercholesteremia   . Heart disease   . CAD (coronary artery disease)     stents  . Hyperlipidemia   . GERD (gastroesophageal reflux disease)   . History of stress test 01/27/2012    Normal study. No significant ischemia demonstration, this low risk scan, no significant change compared to previous study.  . Obesity     Past Surgical History  Procedure Laterality Date  . Carotid stent      x 6 yrs this month  . Colonoscopy  06/15/2012  . Cardiac catheterization  10/2009    CAD stents to the LAD using an Endeavor drug eluting (3x78mm) by Dr Rex Kras, he had normal circumflex and RCA at the time.  . Tonsillectomy      Family History  Problem Relation Age of Onset  . Heart disease Mother   . Healthy Sister    Social History:  reports that he quit smoking about 6 years ago. His smoking use included Cigarettes and Cigars. He has never used smokeless tobacco. He reports that he does not drink alcohol or use illicit drugs.  Allergies: No Known Allergies  Medications Prior to Admission  Medication Sig Dispense Refill  . ALPRAZolam (XANAX) 0.25 MG tablet Take 1 tablet by mouth at bedtime. Take 1 tab daily    . aspirin 325 MG EC tablet Take 325 mg by mouth daily.    Marland Kitchen BYSTOLIC 5 MG tablet TAKE 1 TABLET BY MOUTH ONCE A DAY. 30 tablet 11  . losartan (COZAAR) 50 MG tablet Take 50 mg by  mouth daily.    . mirabegron ER (MYRBETRIQ) 25 MG TB24 Take 25 mg by mouth daily.    . pantoprazole (PROTONIX) 40 MG tablet Take 1 tablet (40 mg total) by mouth 2 (two) times daily before a meal. 60 tablet 3  . rosuvastatin (CRESTOR) 5 MG tablet TAKE 1 TABLET BY MOUTH AT BEDTIME FOR CHOLESTEROL. 30 tablet 10  . nitroGLYCERIN (NITROSTAT) 0.4 MG SL tablet Place 1 tablet (0.4 mg total) under the tongue every 5 (five) minutes as needed. For chest pain 25 tablet 11    No results found for this or any previous visit (from the past 48 hour(s)). No results found.  ROS  Blood pressure 124/84, pulse 64, temperature 97.5 F (36.4 C), temperature source Oral, resp. rate 15, height 5\' 9"  (1.753 m), weight 309 lb (140.161 kg), SpO2 96 %. Physical Exam  Constitutional:  Well-developed obese Caucasian male in NAD.  HENT:  Mouth/Throat: Oropharynx is clear and moist.  Eyes: Conjunctivae are normal. No scleral icterus.  Neck: No thyromegaly present.  Cardiovascular: Normal rate, regular rhythm and normal heart sounds.   No murmur heard. Respiratory: Effort normal and breath sounds normal.  GI:  Full abdomen but soft and nontender without organomegaly or masses.  Musculoskeletal: He exhibits no edema.  Lymphadenopathy:  He has no cervical adenopathy.  Neurological: He is alert.  Skin: Skin is warm and dry.     Assessment/Plan Epigastric pain nausea and anorexia. Diagnostic EGD.  REHMAN,NAJEEB U 11/15/2015, 2:36 PM

## 2015-11-18 ENCOUNTER — Other Ambulatory Visit (INDEPENDENT_AMBULATORY_CARE_PROVIDER_SITE_OTHER): Payer: Self-pay | Admitting: Internal Medicine

## 2015-11-18 DIAGNOSIS — R110 Nausea: Secondary | ICD-10-CM

## 2015-11-18 DIAGNOSIS — R1013 Epigastric pain: Principal | ICD-10-CM

## 2015-11-18 DIAGNOSIS — G8929 Other chronic pain: Secondary | ICD-10-CM

## 2015-11-19 ENCOUNTER — Ambulatory Visit (HOSPITAL_COMMUNITY)
Admission: RE | Admit: 2015-11-19 | Discharge: 2015-11-19 | Disposition: A | Discharge status: Home / Self Care | Payer: PRIVATE HEALTH INSURANCE | Source: Ambulatory Visit | Attending: Internal Medicine | Admitting: Internal Medicine

## 2015-11-19 DIAGNOSIS — R1013 Epigastric pain: Secondary | ICD-10-CM | POA: Insufficient documentation

## 2015-11-19 DIAGNOSIS — R110 Nausea: Secondary | ICD-10-CM

## 2015-11-19 DIAGNOSIS — R932 Abnormal findings on diagnostic imaging of liver and biliary tract: Secondary | ICD-10-CM | POA: Diagnosis not present

## 2015-11-19 DIAGNOSIS — G8929 Other chronic pain: Secondary | ICD-10-CM

## 2015-11-20 ENCOUNTER — Ambulatory Visit (INDEPENDENT_AMBULATORY_CARE_PROVIDER_SITE_OTHER): Payer: Self-pay | Admitting: Internal Medicine

## 2015-11-20 ENCOUNTER — Encounter (HOSPITAL_COMMUNITY): Payer: Self-pay | Admitting: Internal Medicine

## 2015-11-20 ENCOUNTER — Telehealth (INDEPENDENT_AMBULATORY_CARE_PROVIDER_SITE_OTHER): Payer: Self-pay | Admitting: Internal Medicine

## 2015-11-20 MED ORDER — DICYCLOMINE HCL 10 MG PO CAPS: 10.0000 mg | ORAL_CAPSULE | Freq: Three times a day (TID) | ORAL | Status: DC

## 2015-11-20 NOTE — Telephone Encounter (Signed)
Patient called to go with results of ultrasound.

## 2016-01-07 ENCOUNTER — Ambulatory Visit (INDEPENDENT_AMBULATORY_CARE_PROVIDER_SITE_OTHER): Payer: PRIVATE HEALTH INSURANCE | Admitting: Internal Medicine

## 2016-01-07 ENCOUNTER — Encounter (INDEPENDENT_AMBULATORY_CARE_PROVIDER_SITE_OTHER): Payer: Self-pay | Admitting: Internal Medicine

## 2016-01-07 VITALS — BP 144/66 | HR 88 | Temp 97.5°F | Ht 69.0 in | Wt 312.5 lb

## 2016-01-07 DIAGNOSIS — K589 Irritable bowel syndrome without diarrhea: Secondary | ICD-10-CM | POA: Diagnosis not present

## 2016-01-07 DIAGNOSIS — K219 Gastro-esophageal reflux disease without esophagitis: Secondary | ICD-10-CM

## 2016-01-07 NOTE — Patient Instructions (Signed)
OV in 1 year. Dicyclomine 10mg  BID Protonix 40mg  daily.

## 2016-01-07 NOTE — Progress Notes (Signed)
Subjective:    Patient ID: Antonio Colon, male    DOB: 08-04-1951, 65 y.o.   MRN: [id number]  HPI Here today for f/u. He tells me he is doing good. He is walking a lot since starting his new jobs. He has no GI complaints. His appetite is good. He has lost 2.5 pounds since his last visit.  No abdominal pain now. He says the pain stopped a couple of weeks ago after finding out he had gotten a new job. 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 Usually has one stool a day. Most stools are formed unless he eats something that he shouldn't. No melena or BRRB.   11/09/2015 Weight 315    11/19/2015: US abdomen:   1. No acute abnormality identified. Specifically no evidence of gallstones or biliary distention.   2. Echogenic liver consistent with fatty infiltration and/or hepatocellular disease. Tiny cyst in the dome of the liver could not be visualized due to patient's body habitus .  11/15/2015   EGD  Indications:  Patient is 65 year old Caucasian male who was chronic GERD and has been doing well until EPI. He's been having postprandial epigastric pain associated with nausea and he has lost 12 pounds. Symptoms started after he lost his job. Lab studies were unremarkable including serum amylase and lipase. H. pylori serology was negative. Abdominal CT was unremarkable except small cyst in left lobe of the liver. He is on low-dose aspirin. He is undergoing diagnostic EGD. Patient states he is having less pain since pantoprazole dose was doubled.                                   Impression: Small sliding hiatal hernia without evidence of erosive esophagitis. Multiple small hyperplastic appearing polyps at gastric body and fundus. These were left alone. No evidence of peptic ulcer disease or gastritis.                            10/28/2015 CT abdomen/pelvis with CM: Abdominal pain x 1 week: No acute abnormalities. Slight diverticulosis of the left  side of coon. Slight progression of degenerative facet arthritis in the lower lumber spine.      06/15/2012   Colonoscopy  Indications: Patient is 65 year old Caucasian male was undergoing average risk screening colonoscopy.  Impression:   Normal terminal ileum and normal colonoscopy.                                                        Review of Systems Past Medical History  Diagnosis Date  . Hypertension   . Hypercholesteremia   . Heart disease   . CAD (coronary artery disease)     stents  . Hyperlipidemia   . GERD (gastroesophageal reflux disease)   . History of stress test 01/27/2012    Normal study. No significant ischemia demonstration, this low risk scan, no significant change compared to previous study.  . Obesity     Past Surgical History  Procedure Laterality Date  . Carotid stent      x 6 yrs this month  . Colonoscopy  06/15/2012  . Cardiac catheterization  10/2009  CAD stents to the LAD using an Endeavor drug eluting (3x33mm) by Dr Rex Kras, he had normal circumflex and RCA at the time.  . Tonsillectomy    . Esophagogastroduodenoscopy N/A 11/15/2015    Procedure: ESOPHAGOGASTRODUODENOSCOPY (EGD);  Surgeon: Rogene Houston, MD;  Location: [facility];  Service: Endoscopy;  Laterality: N/A;  1:10 - moved to 12:55 - Ann notified pt    No Known Allergies  Current Outpatient Prescriptions on File Prior to Visit  Medication Sig Dispense Refill  . ALPRAZolam (XANAX) 0.25 MG tablet Take 1 tablet by mouth at bedtime. Take 1 tab daily    . aspirin EC 81 MG tablet Take 1 tablet (81 mg total) by mouth daily.    Marland Kitchen BYSTOLIC 5 MG tablet TAKE 1 TABLET BY MOUTH ONCE A DAY. 30 tablet 11  . dicyclomine (BENTYL) 10 MG capsule Take 1 capsule (10 mg total) by mouth 3 (three) times daily before meals. 90 capsule 2  . losartan (COZAAR) 50 MG tablet Take 50 mg by mouth daily.    . mirabegron ER (MYRBETRIQ) 25 MG TB24 Take 25 mg by mouth daily.    . nitroGLYCERIN (NITROSTAT) 0.4  MG SL tablet Place 1 tablet (0.4 mg total) under the tongue every 5 (five) minutes as needed. For chest pain 25 tablet 11  . pantoprazole (PROTONIX) 40 MG tablet Take 1 tablet (40 mg total) by mouth 2 (two) times daily before a meal. 60 tablet 3  . rosuvastatin (CRESTOR) 5 MG tablet TAKE 1 TABLET BY MOUTH AT BEDTIME FOR CHOLESTEROL. 30 tablet 10   No current facility-administered medications on file prior to visit.        Objective:   Physical Exam Blood pressure 144/66, pulse 88, temperature 97.5 F (36.4 C), height 5\' 9"  (1.753 m), weight 312 lb 8 oz (141.749 kg). Alert and oriented. Skin warm and dry. Oral mucosa is moist.   . Sclera anicteric, conjunctivae is pink. Thyroid not enlarged. No cervical lymphadenopathy. Lungs clear. Heart regular rate and rhythm.  Abdomen is soft. Bowel sounds are positive. No hepatomegaly. No abdominal masses felt. No tenderness.  No edema to lower extremities.           Assessment & Plan:  GERDcontrolled at this time. May take Protonix 40mg  once a day.   IBS: No problem at this time. May cut Dicyclomine 10mg  to once a day.  OV in one year.    OV in 1 year.

## 2016-01-28 ENCOUNTER — Other Ambulatory Visit (INDEPENDENT_AMBULATORY_CARE_PROVIDER_SITE_OTHER): Payer: Self-pay | Admitting: Internal Medicine

## 2016-02-17 ENCOUNTER — Emergency Department (HOSPITAL_COMMUNITY)
Admission: EM | Admit: 2016-02-17 | Discharge: 2016-02-17 | Disposition: A | Discharge status: Home / Self Care | Payer: PRIVATE HEALTH INSURANCE | Attending: Emergency Medicine | Admitting: Emergency Medicine

## 2016-02-17 ENCOUNTER — Encounter (HOSPITAL_COMMUNITY): Payer: Self-pay | Admitting: *Deleted

## 2016-02-17 ENCOUNTER — Telehealth (INDEPENDENT_AMBULATORY_CARE_PROVIDER_SITE_OTHER): Payer: Self-pay | Admitting: Internal Medicine

## 2016-02-17 DIAGNOSIS — I2510 Atherosclerotic heart disease of native coronary artery without angina pectoris: Secondary | ICD-10-CM | POA: Diagnosis not present

## 2016-02-17 DIAGNOSIS — K219 Gastro-esophageal reflux disease without esophagitis: Secondary | ICD-10-CM | POA: Diagnosis not present

## 2016-02-17 DIAGNOSIS — E669 Obesity, unspecified: Secondary | ICD-10-CM | POA: Insufficient documentation

## 2016-02-17 DIAGNOSIS — R197 Diarrhea, unspecified: Secondary | ICD-10-CM | POA: Insufficient documentation

## 2016-02-17 DIAGNOSIS — E7800 Pure hypercholesterolemia, unspecified: Secondary | ICD-10-CM | POA: Insufficient documentation

## 2016-02-17 DIAGNOSIS — R5383 Other fatigue: Secondary | ICD-10-CM | POA: Insufficient documentation

## 2016-02-17 DIAGNOSIS — R1013 Epigastric pain: Secondary | ICD-10-CM | POA: Insufficient documentation

## 2016-02-17 DIAGNOSIS — R112 Nausea with vomiting, unspecified: Secondary | ICD-10-CM | POA: Insufficient documentation

## 2016-02-17 DIAGNOSIS — I10 Essential (primary) hypertension: Secondary | ICD-10-CM | POA: Insufficient documentation

## 2016-02-17 DIAGNOSIS — Z87891 Personal history of nicotine dependence: Secondary | ICD-10-CM | POA: Diagnosis not present

## 2016-02-17 DIAGNOSIS — Z7982 Long term (current) use of aspirin: Secondary | ICD-10-CM | POA: Diagnosis not present

## 2016-02-17 DIAGNOSIS — Z9889 Other specified postprocedural states: Secondary | ICD-10-CM | POA: Insufficient documentation

## 2016-02-17 DIAGNOSIS — Z79899 Other long term (current) drug therapy: Secondary | ICD-10-CM | POA: Diagnosis not present

## 2016-02-17 DIAGNOSIS — R1084 Generalized abdominal pain: Secondary | ICD-10-CM | POA: Diagnosis present

## 2016-02-17 DIAGNOSIS — E785 Hyperlipidemia, unspecified: Secondary | ICD-10-CM | POA: Insufficient documentation

## 2016-02-17 HISTORY — DX: Diaphragmatic hernia without obstruction or gangrene: K44.9

## 2016-02-17 LAB — COMPREHENSIVE METABOLIC PANEL
ALT: 24 U/L (ref 17–63)
AST: 23 U/L (ref 15–41)
Albumin: 4.4 g/dL (ref 3.5–5.0)
Alkaline Phosphatase: 75 U/L (ref 38–126)
Anion gap: 11 (ref 5–15)
BUN: 19 mg/dL (ref 6–20)
CHLORIDE: 107 mmol/L (ref 101–111)
CO2: 25 mmol/L (ref 22–32)
CREATININE: 0.93 mg/dL (ref 0.61–1.24)
Calcium: 9.1 mg/dL (ref 8.9–10.3)
GFR calc Af Amer: >60 mL/min (ref >60)
GLUCOSE: 106 mg/dL — AB (ref 65–99)
Potassium: 3.7 mmol/L (ref 3.5–5.1)
Sodium: 143 mmol/L (ref 135–145)
Total Bilirubin: 1.4 mg/dL — ABNORMAL HIGH (ref 0.3–1.2)
Total Protein: 7.4 g/dL (ref 6.5–8.1)

## 2016-02-17 LAB — CBC
HCT: 53 % — ABNORMAL HIGH (ref 39.0–52.0)
Hemoglobin: 18.2 g/dL — ABNORMAL HIGH (ref 13.0–17.0)
MCH: 30.1 pg (ref 26.0–34.0)
MCHC: 34.3 g/dL (ref 30.0–36.0)
MCV: 87.7 fL (ref 78.0–100.0)
PLATELETS: 200 10*3/uL (ref 150–400)
RBC: 6.04 MIL/uL — ABNORMAL HIGH (ref 4.22–5.81)
RDW: 13.5 % (ref 11.5–15.5)
WBC: 9.6 10*3/uL (ref 4.0–10.5)

## 2016-02-17 LAB — LIPASE, BLOOD: LIPASE: 26 U/L (ref 11–51)

## 2016-02-17 MED ORDER — MORPHINE SULFATE (PF) 4 MG/ML IV SOLN
4.0000 mg | Freq: Once | INTRAVENOUS | Status: AC
  Administered 2016-02-17: 4 mg via INTRAVENOUS
  Filled 2016-02-17: qty 1

## 2016-02-17 MED ORDER — ONDANSETRON 8 MG PO TBDP: 8.0000 mg | ORAL_TABLET | Freq: Three times a day (TID) | ORAL | Status: DC | PRN

## 2016-02-17 MED ORDER — ONDANSETRON HCL 4 MG/2ML IJ SOLN
4.0000 mg | Freq: Once | INTRAMUSCULAR | Status: AC
  Administered 2016-02-17: 4 mg via INTRAVENOUS
  Filled 2016-02-17: qty 2

## 2016-02-17 MED ORDER — SODIUM CHLORIDE 0.9 % IV BOLUS (SEPSIS)
1000.0000 mL | Freq: Once | INTRAVENOUS | Status: AC
  Administered 2016-02-17: 1000 mL via INTRAVENOUS

## 2016-02-17 MED ORDER — PANTOPRAZOLE SODIUM 40 MG IV SOLR
40.0000 mg | Freq: Once | INTRAVENOUS | Status: AC
  Administered 2016-02-17: 40 mg via INTRAVENOUS
  Filled 2016-02-17: qty 40

## 2016-02-17 NOTE — ED Provider Notes (Signed)
CSN: [id number]     Arrival date & time 02/17/16  72 History   First MD Initiated Contact with Patient 02/17/16 1847     Chief Complaint  Patient presents with  . Abdominal Pain    Patient is a 65 y.o. male presenting with abdominal pain. The history is provided by the patient and the spouse.  Abdominal Pain Pain location:  Generalized Pain quality: cramping   Pain severity:  Severe Onset quality:  Gradual Duration:  18 hours Timing:  Constant Progression:  Worsening Chronicity:  New Relieved by:  Nothing Worsened by:  Nothing tried Associated symptoms: diarrhea, fatigue, nausea and vomiting   Associated symptoms: no chest pain, no dysuria, no fever and no hematochezia   Risk factors comment:  No travel pt reports waking up early this morning with diffuse abdominal cramping and also having multiple episodes of nonbloody diarrhea.  He has had vomiting since being in the ED He had otherwise been well He has had similar episodes previously No travel He has been on antibiotics recently but not currently   Past Medical History  Diagnosis Date  . Hypertension   . Hypercholesteremia   . Heart disease   . CAD (coronary artery disease)     stents  . Hyperlipidemia   . GERD (gastroesophageal reflux disease)   . History of stress test 01/27/2012    Normal study. No significant ischemia demonstration, this low risk scan, no significant change compared to previous study.  . Obesity   . Hiatal hernia    Past Surgical History  Procedure Laterality Date  . Carotid stent      x 6 yrs this month  . Colonoscopy  06/15/2012  . Cardiac catheterization  10/2009    CAD stents to the LAD using an Endeavor drug eluting (3x67mm) by Dr Rex Kras, he had normal circumflex and RCA at the time.  . Tonsillectomy    . Esophagogastroduodenoscopy N/A 11/15/2015    Procedure: ESOPHAGOGASTRODUODENOSCOPY (EGD);  Surgeon: Rogene Houston, MD;  Location: [facility];  Service: Endoscopy;  Laterality:  N/A;  1:10 - moved to 12:55 - Ann notified pt   Family History  Problem Relation Age of Onset  . Heart disease Mother   . Healthy Sister    Social History  Substance Use Topics  . Smoking status: Former Smoker    Types: Cigarettes, Cigars    Quit date: 01/25/2009  . Smokeless tobacco: Never Used  . Alcohol Use: No    Review of Systems  Constitutional: Positive for fatigue. Negative for fever.  Cardiovascular: Negative for chest pain.  Gastrointestinal: Positive for nausea, vomiting, abdominal pain and diarrhea. Negative for hematochezia.  Genitourinary: Negative for dysuria.  All other systems reviewed and are negative.     Allergies  Review of patient's allergies indicates no known allergies.  Home Medications   Prior to Admission medications   Medication Sig Start Date End Date Taking? Authorizing Provider  ALPRAZolam Duanne Moron) 0.25 MG tablet Take 1 tablet by mouth at bedtime. Take 1 tab daily 06/24/15   Historical Provider, MD  aspirin EC 81 MG tablet Take 1 tablet (81 mg total) by mouth daily. 11/15/15   Rogene Houston, MD  BYSTOLIC 5 MG tablet TAKE 1 TABLET BY MOUTH ONCE A DAY. 10/07/15   Lorretta Harp, MD  dicyclomine (BENTYL) 10 MG capsule TAKE 1 CAPSULE BY MOUTH THREE TIMES DAILY BEFORE MEALS. 01/28/16   Butch Penny, NP  losartan (COZAAR) 50 MG tablet Take 50  mg by mouth daily.    Historical Provider, MD  mirabegron ER (MYRBETRIQ) 25 MG TB24 Take 25 mg by mouth daily.    Historical Provider, MD  nitroGLYCERIN (NITROSTAT) 0.4 MG SL tablet Place 1 tablet (0.4 mg total) under the tongue every 5 (five) minutes as needed. For chest pain 07/05/13   Lorretta Harp, MD  pantoprazole (PROTONIX) 40 MG tablet Take 1 tablet (40 mg total) by mouth 2 (two) times daily before a meal. 11/07/15   Butch Penny, NP  rosuvastatin (CRESTOR) 5 MG tablet TAKE 1 TABLET BY MOUTH AT BEDTIME FOR CHOLESTEROL. 09/23/15   Lorretta Harp, MD   BP 137/80 mmHg  Pulse 91  Temp(Src) 98.8 F  (37.1 C) (Oral)  Resp 20  Ht 5\' 9"  (1.753 m)  Wt 137.44 kg  BMI 44.72 kg/m2  SpO2 100% Physical Exam CONSTITUTIONAL: Well developed/well nourished, uncomfortable appearing HEAD: Normocephalic/atraumatic EYES: EOMI/PERRL, no icterus ENMT: Mucous membranes dry NECK: supple no meningeal signs SPINE/BACK:entire spine nontender CV: S1/S2 noted, no murmurs/rubs/gallops noted LUNGS: Lungs are clear to auscultation bilaterally, no apparent distress ABDOMEN: soft, obese, diffuse tenderness, with significant tenderness in epigastric region, bowel sounds noted throughout abdomen NEURO: Pt is awake/alert/appropriate, moves all extremitiesx4.  No facial droop.   EXTREMITIES: pulses normal/equal, full ROM SKIN: warm, color normal PSYCH: no abnormalities of mood noted, alert and oriented to situation  ED Course  Procedures  Medications  sodium chloride 0.9 % bolus 1,000 mL (0 mLs Intravenous Stopped 02/17/16 2056)  ondansetron (ZOFRAN) injection 4 mg (4 mg Intravenous Given 02/17/16 1909)  morphine 4 MG/ML injection 4 mg (4 mg Intravenous Given 02/17/16 1915)  pantoprazole (PROTONIX) injection 40 mg (40 mg Intravenous Given 02/17/16 1915)  morphine 4 MG/ML injection 4 mg (4 mg Intravenous Given 02/17/16 1955)  sodium chloride 0.9 % bolus 1,000 mL (0 mLs Intravenous Stopped 02/17/16 2128)    Labs Review Labs Reviewed  COMPREHENSIVE METABOLIC PANEL - Abnormal; Notable for the following:    Glucose, Bld 106 (*)    Total Bilirubin 1.4 (*)    All other components within normal limits  CBC - Abnormal; Notable for the following:    RBC 6.04 (*)    Hemoglobin 18.2 (*)    HCT 53.0 (*)    All other components within normal limits  LIPASE, BLOOD    I have personally reviewed and evaluated these lab results as part of my medical decision-making.   9:38 PM Pt reports vomiting and diarrhea for past day He also reports return of epigastric abd pain After IV fluids and pain meds, he appears  improved He was sleeping during most of ED stay, no vomiting When he wakes up he reports pain is still present then goes back to sleep He points to epigastric region as source of pain No vomiting No diarrhea here He is hemodynamically stable He reports this is all similar to prior episodes of pain with extensive workup by GI He is already on bentyl and also protonix at home Will add on zofran for nausea Although I didn't feel he needed new imaging, I did offer further monitoring/meds Pt declines and wants to go home Wife reports "just send [place] home" Advised to call his GI physician tomorrow  MDM   Final diagnoses:  Epigastric abdominal pain    Nursing notes including past medical history and social history reviewed and considered in documentation Labs/vital reviewed myself and considered during evaluation Previous records reviewed and considered  Ripley Fraise, MD 02/17/16 2141

## 2016-02-17 NOTE — ED Notes (Signed)
Pt evaluated by MD.

## 2016-02-17 NOTE — ED Notes (Signed)
Pt reports abd pain since 0400, states that he has a hx of hiatal hernia

## 2016-02-17 NOTE — ED Notes (Signed)
Pt reports that he ate a chicken salad sandwich last noght for supper and again for lunch today- reports that he is less nauseated but has still pain 8-10

## 2016-02-17 NOTE — ED Notes (Signed)
Woke up this morning with diarrhea and mild upper abdominal pain. States as the day passed, pain and diarrhea became worse. Vomited x 1 today on arrival.

## 2016-02-17 NOTE — ED Notes (Signed)
Patient given mouth swab and Moisturizer for dry mouth.

## 2016-02-17 NOTE — Telephone Encounter (Signed)
Ms. Thiry left a message saying Ronik is experiencing severe stomach cramping with diarrhea and nausea. She's wondering if he can be worked in soon. Terri's first available appointment was on March 8th but she'd like Rahsaan to be worked in this week if possible. Please call the pt in regards to this.  Pt's ph# [phone]  Thank you.

## 2016-02-17 NOTE — Discharge Instructions (Signed)

## 2016-02-18 NOTE — Telephone Encounter (Signed)
I have spoken with patient. May have a viral syndrome. He will call me Wednesday and let me know how he feels. If there is a cancellation, we will try to work in

## 2016-02-21 ENCOUNTER — Telehealth (INDEPENDENT_AMBULATORY_CARE_PROVIDER_SITE_OTHER): Payer: Self-pay | Admitting: Internal Medicine

## 2016-02-21 DIAGNOSIS — K219 Gastro-esophageal reflux disease without esophagitis: Secondary | ICD-10-CM

## 2016-02-21 MED ORDER — SUCRALFATE 1 GM/10ML PO SUSP: 1.0000 g | Freq: Four times a day (QID) | ORAL | Status: DC

## 2016-02-21 NOTE — Telephone Encounter (Signed)
States he ate fried chicken yesterday but he did pull the skin off. Now he has some nausea and epigastric pain. I am going to call him in some Carafate and he can take the zofran he has on hand. He will call me Monday.

## 2016-04-25 ENCOUNTER — Other Ambulatory Visit (INDEPENDENT_AMBULATORY_CARE_PROVIDER_SITE_OTHER): Payer: Self-pay | Admitting: Internal Medicine

## 2016-04-27 ENCOUNTER — Other Ambulatory Visit (INDEPENDENT_AMBULATORY_CARE_PROVIDER_SITE_OTHER): Payer: Self-pay | Admitting: Internal Medicine

## 2016-05-13 DIAGNOSIS — I119 Hypertensive heart disease without heart failure: Secondary | ICD-10-CM | POA: Diagnosis not present

## 2016-05-13 DIAGNOSIS — K589 Irritable bowel syndrome without diarrhea: Secondary | ICD-10-CM | POA: Diagnosis not present

## 2016-05-13 DIAGNOSIS — N3281 Overactive bladder: Secondary | ICD-10-CM | POA: Diagnosis not present

## 2016-08-14 ENCOUNTER — Encounter: Payer: Self-pay | Admitting: Cardiovascular Disease

## 2016-08-14 DIAGNOSIS — R739 Hyperglycemia, unspecified: Secondary | ICD-10-CM | POA: Diagnosis not present

## 2016-08-14 DIAGNOSIS — Z0000 Encounter for general adult medical examination without abnormal findings: Secondary | ICD-10-CM | POA: Diagnosis not present

## 2016-08-14 DIAGNOSIS — E7800 Pure hypercholesterolemia, unspecified: Secondary | ICD-10-CM | POA: Diagnosis not present

## 2016-08-14 DIAGNOSIS — E669 Obesity, unspecified: Secondary | ICD-10-CM | POA: Diagnosis not present

## 2016-08-14 DIAGNOSIS — K589 Irritable bowel syndrome without diarrhea: Secondary | ICD-10-CM | POA: Diagnosis not present

## 2016-08-14 DIAGNOSIS — Z125 Encounter for screening for malignant neoplasm of prostate: Secondary | ICD-10-CM | POA: Diagnosis not present

## 2016-08-14 DIAGNOSIS — I2510 Atherosclerotic heart disease of native coronary artery without angina pectoris: Secondary | ICD-10-CM | POA: Diagnosis not present

## 2016-08-17 ENCOUNTER — Other Ambulatory Visit: Payer: Self-pay | Admitting: Cardiovascular Disease

## 2016-08-17 NOTE — Telephone Encounter (Signed)
Rx(s) sent to pharmacy electronically.  

## 2016-08-18 ENCOUNTER — Encounter: Payer: Self-pay | Admitting: Cardiovascular Disease

## 2016-08-18 ENCOUNTER — Ambulatory Visit (INDEPENDENT_AMBULATORY_CARE_PROVIDER_SITE_OTHER): Payer: Medicare Other | Admitting: Cardiovascular Disease

## 2016-08-18 VITALS — BP 124/86 | HR 72 | Ht 69.0 in | Wt 301.0 lb

## 2016-08-18 DIAGNOSIS — E785 Hyperlipidemia, unspecified: Secondary | ICD-10-CM

## 2016-08-18 DIAGNOSIS — I2510 Atherosclerotic heart disease of native coronary artery without angina pectoris: Secondary | ICD-10-CM | POA: Diagnosis not present

## 2016-08-18 DIAGNOSIS — I2583 Coronary atherosclerosis due to lipid rich plaque: Secondary | ICD-10-CM

## 2016-08-18 DIAGNOSIS — I10 Essential (primary) hypertension: Secondary | ICD-10-CM | POA: Diagnosis not present

## 2016-08-18 NOTE — Assessment & Plan Note (Signed)
History of hyperlipidemia on statin therapy followed by his PCP 

## 2016-08-18 NOTE — Assessment & Plan Note (Signed)
History of hypertension blood pressure measured at 124/86. He is on Bystolic , and losartan. Continue current meds at current dosing

## 2016-08-18 NOTE — Assessment & Plan Note (Signed)
History of CAD status post cardiac catheterization performed by Dr. Rex Kras November 2010 and Endeavor drug-eluting stent) 3 mm x 18 mm) implanted in the LAD. Normal circumflex and RCA at that time. Myoview stress test performed last year was unremarkable. He denies chest pain or shortness of breath.

## 2016-08-18 NOTE — Progress Notes (Signed)
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 Currently he works in Production assistant, radio [place]..  I last saw him in the office 08/13/59. He has a history of CAD status post LAD stenting using an Endeavor drug eluting stent (3 x 18) by Dr. Aldona Bar in November 2010. He had normal circumflex and RCA at that time. His other problems include: Remote tobacco abuse, treated hypertension, and dyslipidemia. He saw Dr. Mali Hilty in the office on January 21, 2012, complaining of chest pain, and a Myoview stress test performed a week later showed no ischemia. He does have a history of GERD. He has had no recurrent symptoms.Dr. Luan Pulling follows his lipid profile closely. Since I saw me year ago he has lost approximately 10-15 pounds. He has joined Chief of Staff is currently riding his bicycle 55 minutes a day.  Current Outpatient Prescriptions  Medication Sig Dispense Refill  . ALPRAZolam (XANAX) 0.25 MG tablet Take 1 tablet by mouth at bedtime. Take 1 tab daily    . aspirin EC 81 MG tablet Take 1 tablet (81 mg total) by mouth daily.    Marland Kitchen BYSTOLIC 5 MG tablet TAKE 1 TABLET BY MOUTH ONCE A DAY. 30 tablet 11  . dicyclomine (BENTYL) 10 MG capsule Take 10 mg by mouth 4 (four) times daily -  before meals and at bedtime.    Marland Kitchen losartan (COZAAR) 50 MG tablet Take 50 mg by mouth daily.    . mirabegron ER (MYRBETRIQ) 25 MG TB24 Take 25 mg by mouth daily.    . nitroGLYCERIN (NITROSTAT) 0.4 MG SL tablet Place 1 tablet (0.4 mg total) under the tongue every 5 (five) minutes as needed. For chest pain 25 tablet 11  . pantoprazole (PROTONIX) 40 MG tablet TAKE ONE TABLET BY MOUTH TWICE DAILY  BEFORE A MEAL. 60 tablet 0  . rosuvastatin (CRESTOR) 5 MG tablet TAKE 1 TABLET BY MOUTH AT BEDTIME FOR CHOLESTEROL. 30 tablet 6  . sucralfate (CARAFATE) 1 GM/10ML suspension Take 10 mLs (1 g total) by mouth 4 (four) times daily. 420 mL 1   No current facility-administered medications for this visit.     No Known Allergies  Social History   Social History  . Marital status: Married    Spouse name: N/A  . Number of children: N/A  . Years of education: N/A   Occupational History  . Not on file.   Social History Main Topics  . Smoking status: Former Smoker    Types: Cigarettes, Cigars    Quit date: 01/25/2009  . Smokeless tobacco: Never Used  . Alcohol use No  . Drug use: No  . Sexual activity: Not on file   Other Topics Concern  . Not on file   Social History Narrative  . No narrative on file     Review of Systems: General: negative for chills, fever, night sweats or weight changes.  Cardiovascular: negative for chest pain, dyspnea on exertion, edema, orthopnea, palpitations, paroxysmal nocturnal dyspnea or shortness of breath Dermatological: negative for rash Respiratory: negative for cough or wheezing Urologic: negative for hematuria Abdominal: negative for nausea, vomiting, diarrhea, bright red blood per rectum, melena, or hematemesis Neurologic: negative for  visual changes, syncope, or dizziness All other systems reviewed and are otherwise negative except as noted above.    Blood pressure 124/86, pulse 72, height 5\' 9"  (1.753 m), weight (!) 301 lb (136.5 kg).  General appearance: alert and no distress Neck: no adenopathy, no carotid bruit, no JVD, supple, symmetrical, trachea midline and thyroid not enlarged, symmetric, no tenderness/mass/nodules Lungs: clear to auscultation bilaterally Heart: regular rate and rhythm, S1, S2 normal, no murmur, click, rub or gallop Extremities: extremities normal, atraumatic, no cyanosis or edema  EKG normal sinus rhythm at 72  with left axis deviation poorly progression. I personally reviewed this EKG  ASSESSMENT AND PLAN:   CAD (coronary artery disease) History of CAD status post cardiac catheterization performed by Dr. Rex Kras November 2010 and Endeavor drug-eluting stent) 3 mm x 18 mm) implanted in the LAD. Normal circumflex and RCA at that time. Myoview stress test performed last year was unremarkable. He denies chest pain or shortness of breath.  Hyperlipemia History of hyperlipidemia on statin therapy followed by his PCP  HTN (hypertension) History of hypertension blood pressure measured at 124/86. He is on Bystolic , and losartan. Continue current meds at current dosing      Lorretta Harp MD [facility], [facility] 08/18/2016 8:43 AM

## 2016-08-18 NOTE — Patient Instructions (Signed)
Medication Instructions:  Your physician recommends that you continue on your current medications as directed. Please refer to the Current Medication list given to you today.   Labwork: Labwork will be requested from your primary care physician.  Follow-Up: Your physician wants you to follow-up in: 12 MONTHS WITH DR BERRY. You will receive a reminder letter in the mail two months in advance. If you don't receive a letter, please call our office to schedule the follow-up appointment.   If you need a refill on your cardiac medications before your next appointment, please call your pharmacy.   

## 2016-10-01 ENCOUNTER — Other Ambulatory Visit (INDEPENDENT_AMBULATORY_CARE_PROVIDER_SITE_OTHER): Payer: Self-pay | Admitting: Internal Medicine

## 2016-10-31 ENCOUNTER — Other Ambulatory Visit: Payer: Self-pay | Admitting: Cardiovascular Disease

## 2016-11-02 NOTE — Telephone Encounter (Signed)
Rx request sent to pharmacy.  

## 2016-11-24 ENCOUNTER — Encounter (INDEPENDENT_AMBULATORY_CARE_PROVIDER_SITE_OTHER): Payer: Self-pay

## 2016-11-24 ENCOUNTER — Encounter (INDEPENDENT_AMBULATORY_CARE_PROVIDER_SITE_OTHER): Payer: Self-pay | Admitting: Internal Medicine

## 2017-01-06 ENCOUNTER — Encounter (INDEPENDENT_AMBULATORY_CARE_PROVIDER_SITE_OTHER): Payer: Self-pay | Admitting: Internal Medicine

## 2017-01-06 ENCOUNTER — Encounter (INDEPENDENT_AMBULATORY_CARE_PROVIDER_SITE_OTHER): Payer: Self-pay | Admitting: *Deleted

## 2017-01-06 ENCOUNTER — Ambulatory Visit (INDEPENDENT_AMBULATORY_CARE_PROVIDER_SITE_OTHER): Payer: Medicare Other | Admitting: Internal Medicine

## 2017-01-06 VITALS — BP 140/82 | HR 64 | Temp 97.6°F | Ht 69.0 in | Wt 306.2 lb

## 2017-01-06 DIAGNOSIS — K219 Gastro-esophageal reflux disease without esophagitis: Secondary | ICD-10-CM | POA: Diagnosis not present

## 2017-01-06 DIAGNOSIS — K760 Fatty (change of) liver, not elsewhere classified: Secondary | ICD-10-CM | POA: Diagnosis not present

## 2017-01-06 NOTE — Patient Instructions (Addendum)
Continue the Protonix. OV in 1 year. US abdomen

## 2017-01-06 NOTE — Progress Notes (Signed)
Subjective:    Patient ID: Antonio Colon, male    DOB: [birth year], 66 y.o.   MRN: [id number]  HPI  Here today for f/u of his GERD. He tells me he is doing well. Acid reflux controlled with PRotonix. He says he came off the Protonix but stated having acid reflux so he resumed the Protonix. His appetite is good. He is trying to lose weight. He exercises at the [facility] about 1 1/2 hrs day.  He has a BM daily. No melena or BRRB. His last colonoscopy was in June of 2013. He will be due for colonoscopy this year. He is working part-time with the [employer].  Hx of fatty oliver with normal liver enzymes.  Last [place] was in November of 2016 which revealed:  2. Echogenic liver consistent with fatty infiltration and/or hepatocellular disease. Tiny cyst in the dome of the liver could not be visualized due to patient's body habitus .     CMP Latest Ref Rng & Units 02/17/2016 10/28/2015 07/05/2013  Glucose 65 - 99 mg/dL 106(H) - 91  BUN 6 - 20 mg/dL 19 - 16  Creatinine 0.61 - 1.24 mg/dL 0.93 1.00 0.90  Sodium 135 - 145 mmol/L 143 - 142  Potassium 3.5 - 5.1 mmol/L 3.7 - 4.4  Chloride 101 - 111 mmol/L 107 - 105  CO2 22 - 32 mmol/L 25 - 30  Calcium 8.9 - 10.3 mg/dL 9.1 - 8.7  Total Protein 6.5 - 8.1 g/dL 7.4 - 6.7  Total Bilirubin 0.3 - 1.2 mg/dL 1.4(H) - 0.8  Alkaline Phos 38 - 126 U/L 75 - 79  AST 15 - 41 U/L 23 - 19  ALT 17 - 63 U/L 24 - 24        11/19/2015: US abdomen:   1. No acute abnormality identified. Specifically no evidence of gallstones or biliary distention.  2. Echogenic liver consistent with fatty infiltration and/or hepatocellular disease. Tiny cyst in the dome of the liver could not be visualized due to patient's body habitus .  11/15/2015 EGD  Indications: Patient is 66 year old Caucasian male who was chronic GERD and has been doing well until EPI. He's been having postprandial epigastric pain associated with nausea and he has lost 12 pounds. Symptoms started  after he lost his job. Lab studies were unremarkable including serum amylase and lipase. H. pylori serology was negative. Abdominal CT was unremarkable except small cyst in left lobe of the liver. He is on low-dose aspirin. He is undergoing diagnostic EGD. Patient states he is having less pain since pantoprazole dose was doubled.    Impression: Small sliding hiatal hernia without evidence of erosive esophagitis. Multiple small hyperplastic appearing polyps at gastric body and fundus. These were left alone. No evidence of peptic ulcer disease or gastritis.   10/28/2015 CT abdomen/pelvis with CM: Abdominal pain x 1 week: No acute abnormalities. Slight diverticulosis of the left side of coon. Slight progression of degenerative facet arthritis in the lower lumber spine.      06/15/2012 Colonoscopy  Indications: Patient is 66 year old Caucasian male was undergoing average risk screening colonoscopy.  Impression:  Normal terminal ileum and normal colonoscopy.     Review of Systems Past Medical History:  Diagnosis Date  . CAD (coronary artery disease)    stents  . GERD (gastroesophageal reflux disease)   . Heart disease   . Hiatal hernia   . History of stress test 01/27/2012   Normal study. No significant ischemia demonstration, this low risk  scan, no significant change compared to previous study.  . Hypercholesteremia   . Hyperlipidemia   . Hypertension   . Obesity     Past Surgical History:  Procedure Laterality Date  . CARDIAC CATHETERIZATION  10/2009   CAD stents to the LAD using an Endeavor drug eluting (3x36mm) by Dr Rex Kras, he had normal circumflex and RCA at the time.  Marland Kitchen CAROTID STENT     x 6 yrs this month  . COLONOSCOPY  06/15/2012  . ESOPHAGOGASTRODUODENOSCOPY N/A 11/15/2015   Procedure: ESOPHAGOGASTRODUODENOSCOPY (EGD);  Surgeon: Rogene Houston, MD;  Location: [facility];  Service: Endoscopy;  Laterality: N/A;   1:10 - moved to 12:55 - Ann notified pt  . TONSILLECTOMY      No Known Allergies  Current Outpatient Prescriptions on File Prior to Visit  Medication Sig Dispense Refill  . ALPRAZolam (XANAX) 0.25 MG tablet Take 1 tablet by mouth at bedtime. Take 1 tab daily    . aspirin EC 81 MG tablet Take 1 tablet (81 mg total) by mouth daily.    Marland Kitchen BYSTOLIC 5 MG tablet TAKE 1 TABLET BY MOUTH ONCE A DAY. 30 tablet 11  . losartan (COZAAR) 50 MG tablet Take 50 mg by mouth daily.    . mirabegron ER (MYRBETRIQ) 25 MG TB24 Take 25 mg by mouth daily.    . nitroGLYCERIN (NITROSTAT) 0.4 MG SL tablet Place 1 tablet (0.4 mg total) under the tongue every 5 (five) minutes as needed. For chest pain 25 tablet 11  . pantoprazole (PROTONIX) 40 MG tablet TAKE ONE TABLET BY MOUTH TWICE DAILY BEFORE A MEAL. (Patient taking differently: daily) 60 tablet 11  . rosuvastatin (CRESTOR) 5 MG tablet TAKE 1 TABLET BY MOUTH AT BEDTIME FOR CHOLESTEROL. 30 tablet 6  . sucralfate (CARAFATE) 1 GM/10ML suspension Take 10 mLs (1 g total) by mouth 4 (four) times daily. 420 mL 1   No current facility-administered medications on file prior to visit.        Objective:   Physical Exam Blood pressure 140/82, pulse 64, temperature 97.6 F (36.4 C), height 5\' 9"  (1.753 m), weight (!) 306 lb 3.2 oz (138.9 kg). Alert and oriented. Skin warm and dry. Oral mucosa is moist.   . Sclera anicteric, conjunctivae is pink. Thyroid not enlarged. No cervical lymphadenopathy. Lungs clear. Heart regular rate and rhythm.  Abdomen is soft. Bowel sounds are positive. No hepatomegaly. No abdominal masses felt. No tenderness. Abdomen is obese.   No edema to lower extremities.         Assessment & Plan:  GERD. He is doing well.  Fatty liver:  Needs surveillance US abdomen. Transaminases in February were normal.  OV in 1 year

## 2017-01-14 ENCOUNTER — Ambulatory Visit (HOSPITAL_COMMUNITY)
Admission: RE | Admit: 2017-01-14 | Discharge: 2017-01-14 | Disposition: A | Discharge status: Home / Self Care | Payer: Medicare Other | Source: Ambulatory Visit | Attending: Internal Medicine | Admitting: Internal Medicine

## 2017-01-14 DIAGNOSIS — K760 Fatty (change of) liver, not elsewhere classified: Secondary | ICD-10-CM

## 2017-03-16 ENCOUNTER — Other Ambulatory Visit: Payer: Self-pay | Admitting: Cardiovascular Disease

## 2017-03-16 NOTE — Telephone Encounter (Signed)
Rx(s) sent to pharmacy electronically.  

## 2017-04-13 DIAGNOSIS — I2510 Atherosclerotic heart disease of native coronary artery without angina pectoris: Secondary | ICD-10-CM | POA: Diagnosis not present

## 2017-04-13 DIAGNOSIS — R5383 Other fatigue: Secondary | ICD-10-CM | POA: Diagnosis not present

## 2017-04-13 DIAGNOSIS — I10 Essential (primary) hypertension: Secondary | ICD-10-CM | POA: Diagnosis not present

## 2017-04-14 DIAGNOSIS — R5383 Other fatigue: Secondary | ICD-10-CM | POA: Diagnosis not present

## 2017-04-14 DIAGNOSIS — I2510 Atherosclerotic heart disease of native coronary artery without angina pectoris: Secondary | ICD-10-CM | POA: Diagnosis not present

## 2017-04-14 DIAGNOSIS — I10 Essential (primary) hypertension: Secondary | ICD-10-CM | POA: Diagnosis not present

## 2017-05-04 DIAGNOSIS — M25579 Pain in unspecified ankle and joints of unspecified foot: Secondary | ICD-10-CM | POA: Diagnosis not present

## 2017-05-04 DIAGNOSIS — M722 Plantar fascial fibromatosis: Secondary | ICD-10-CM | POA: Diagnosis not present

## 2017-05-04 DIAGNOSIS — M79671 Pain in right foot: Secondary | ICD-10-CM | POA: Diagnosis not present

## 2017-05-25 DIAGNOSIS — M722 Plantar fascial fibromatosis: Secondary | ICD-10-CM | POA: Diagnosis not present

## 2017-05-25 DIAGNOSIS — M79671 Pain in right foot: Secondary | ICD-10-CM | POA: Diagnosis not present

## 2017-05-25 DIAGNOSIS — M25579 Pain in unspecified ankle and joints of unspecified foot: Secondary | ICD-10-CM | POA: Diagnosis not present

## 2017-06-24 DIAGNOSIS — H33032 Retinal detachment with giant retinal tear, left eye: Secondary | ICD-10-CM | POA: Diagnosis not present

## 2017-06-25 DIAGNOSIS — I2510 Atherosclerotic heart disease of native coronary artery without angina pectoris: Secondary | ICD-10-CM | POA: Diagnosis not present

## 2017-06-25 DIAGNOSIS — N3281 Overactive bladder: Secondary | ICD-10-CM | POA: Diagnosis not present

## 2017-06-25 DIAGNOSIS — I10 Essential (primary) hypertension: Secondary | ICD-10-CM | POA: Diagnosis not present

## 2017-07-17 ENCOUNTER — Other Ambulatory Visit: Payer: Self-pay | Admitting: Cardiovascular Disease

## 2017-07-19 NOTE — Telephone Encounter (Signed)
REFILL 

## 2017-08-17 ENCOUNTER — Ambulatory Visit (INDEPENDENT_AMBULATORY_CARE_PROVIDER_SITE_OTHER): Payer: Medicare Other | Admitting: Cardiovascular Disease

## 2017-08-17 ENCOUNTER — Encounter: Payer: Self-pay | Admitting: Cardiovascular Disease

## 2017-08-17 ENCOUNTER — Other Ambulatory Visit: Payer: Self-pay | Admitting: Cardiovascular Disease

## 2017-08-17 VITALS — BP 172/97 | HR 72 | Ht 69.5 in | Wt 317.2 lb

## 2017-08-17 DIAGNOSIS — I2510 Atherosclerotic heart disease of native coronary artery without angina pectoris: Secondary | ICD-10-CM | POA: Diagnosis not present

## 2017-08-17 DIAGNOSIS — I10 Essential (primary) hypertension: Secondary | ICD-10-CM

## 2017-08-17 DIAGNOSIS — E7800 Pure hypercholesterolemia, unspecified: Secondary | ICD-10-CM

## 2017-08-17 NOTE — Patient Instructions (Signed)
Medication Instructions: Your physician recommends that you continue on your current medications as directed. Please refer to the Current Medication list given to you today.  Labwork: Please have labs faxed to [phone] ATTN: Dr. Gwenlyn Found when you have them done in October, including Cholesterol (Lipid panel).  Follow-Up: Your physician wants you to follow-up in: 1 year with Dr. Gwenlyn Found. You will receive a reminder letter in the mail two months in advance. If you don't receive a letter, please call our office to schedule the follow-up appointment.  If you need a refill on your cardiac medications before your next appointment, please call your pharmacy.

## 2017-08-17 NOTE — Progress Notes (Signed)
08/17/2017 Antonio Colon   [birth year]  [id number]  Primary Physician Sinda Du, MD Primary Cardiologist: Lorretta Harp MD [name], [place]  HPI:  Antonio Colon is a 66 y.o. 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 Currently he works in Production assistant, radio [place].. I last saw him in the office 08/18/16. He has a history of CAD status post LAD stenting using an Endeavor drug eluting stent (3 x 18) by Dr. Aldona Bar in November 2010. He had normal circumflex and RCA at that time. His other problems include: Remote tobacco abuse, treated hypertension, and dyslipidemia. He saw Dr. Mali Hilty in the office on January 21, 2012, complaining of chest pain, and a Myoview stress test performed a week later showed no ischemia. He does have a history of GERD. He has had no recurrent symptoms.Dr. Luan Pulling follows his lipid profile closely. Since I saw me year ago he has gained approximately 10-15 pounds. He has joined Chief of Staff is currently riding his bicycle for 30 minutes a day and works out on a treadmill for another 30 minutes a day.   Current Meds  Medication Sig  . ALPRAZolam (XANAX) 0.25 MG tablet Take 1 tablet by mouth at bedtime. Take 1 tab daily  . aspirin EC 81 MG tablet Take 1 tablet (81 mg total) by mouth daily.  Marland Kitchen BYSTOLIC 5 MG tablet TAKE 1 TABLET BY MOUTH ONCE A DAY.  Marland Kitchen losartan (COZAAR) 50 MG tablet Take 50 mg by mouth daily.  . mirabegron ER (MYRBETRIQ) 25 MG TB24 Take 25 mg by mouth daily.  . nitroGLYCERIN (NITROSTAT) 0.4 MG SL tablet Place 1 tablet (0.4 mg total) under the tongue every 5 (five) minutes as needed. For chest pain  . pantoprazole (PROTONIX) 40 MG tablet TAKE ONE TABLET BY MOUTH TWICE DAILY BEFORE A MEAL. (Patient taking differently: daily)  . rosuvastatin (CRESTOR) 5 MG tablet TAKE 1 TABLET BY MOUTH AT  BEDTIME FOR CHOLESTEROL.  Marland Kitchen sucralfate (CARAFATE) 1 GM/10ML suspension Take 10 mLs (1 g total) by mouth 4 (four) times daily.     No Known Allergies  Social History   Social History  . Marital status: Married    Spouse name: N/A  . Number of children: N/A  . Years of education: N/A   Occupational History  . Not on file.   Social History Main Topics  . Smoking status: Former Smoker    Types: Cigarettes, Cigars    Quit date: 01/25/2009  . Smokeless tobacco: Never Used  . Alcohol use No  . Drug use: No  . Sexual activity: Not on file   Other Topics Concern  . Not on file   Social History Narrative  . No narrative on file     Review of Systems: General: negative for chills, fever, night sweats or weight changes.  Cardiovascular: negative for chest pain, dyspnea on exertion, edema, orthopnea, palpitations, paroxysmal nocturnal dyspnea or shortness of breath Dermatological: negative for rash Respiratory: negative for cough or wheezing Urologic: negative for hematuria Abdominal: negative for nausea, vomiting, diarrhea, bright red blood per rectum, melena, or hematemesis Neurologic: negative for visual changes, syncope, or dizziness All other systems reviewed and are otherwise negative except as noted above.    Blood pressure (!) 172/97, pulse 72, height 5' 9.5" (1.765 m), weight (!) 317 lb 3.2 oz (143.9 kg), SpO2  94 %.  General appearance: alert and no distress Neck: no adenopathy, no carotid bruit, no JVD, supple, symmetrical, trachea midline and thyroid not enlarged, symmetric, no tenderness/mass/nodules Lungs: clear to auscultation bilaterally Heart: regular rate and rhythm, S1, S2 normal, no murmur, click, rub or gallop Extremities: extremities normal, atraumatic, no cyanosis or edema  EKG Normal sinus rhythm at 66 with poor R-wave progression and left axis deviation. I personally reviewed this EKG.  ASSESSMENT AND PLAN:   CAD (coronary artery disease) History of  CAD status post LAD stenting using an Endeavor drug-eluting stent (3 mm x 18 mm) by Dr. Rex Kras November 2010. He had a normal circumflex and RCA at that time. He denies chest pain or shortness of breath. He did have a Myoview stress test performed 01/27/12 which was low risk and nonischemic.  Hyperlipemia History of hyperlipidemia on statin therapy followed by his PCP  HTN (hypertension) History of essential hypertension blood pressure is 172/97. He is on Bystolic  and losartan. Continue current meds at current dosing      Lorretta Harp MD [facility], [facility] 08/17/2017 8:50 AM

## 2017-08-17 NOTE — Assessment & Plan Note (Signed)
History of hyperlipidemia on statin therapy followed by his PCP 

## 2017-08-17 NOTE — Assessment & Plan Note (Signed)
History of essential hypertension blood pressure is 172/97. He is on Bystolic  and losartan. Continue current meds at current dosing

## 2017-08-17 NOTE — Assessment & Plan Note (Signed)
History of CAD status post LAD stenting using an Endeavor drug-eluting stent (3 mm x 18 mm) by Dr. Rex Kras November 2010. He had a normal circumflex and RCA at that time. He denies chest pain or shortness of breath. He did have a Myoview stress test performed 01/27/12 which was low risk and nonischemic.

## 2017-10-26 DIAGNOSIS — N3281 Overactive bladder: Secondary | ICD-10-CM | POA: Diagnosis not present

## 2017-10-26 DIAGNOSIS — I119 Hypertensive heart disease without heart failure: Secondary | ICD-10-CM | POA: Diagnosis not present

## 2017-10-26 DIAGNOSIS — Z0000 Encounter for general adult medical examination without abnormal findings: Secondary | ICD-10-CM | POA: Diagnosis not present

## 2017-10-26 DIAGNOSIS — Z125 Encounter for screening for malignant neoplasm of prostate: Secondary | ICD-10-CM | POA: Diagnosis not present

## 2017-10-26 DIAGNOSIS — I10 Essential (primary) hypertension: Secondary | ICD-10-CM | POA: Diagnosis not present

## 2017-10-26 DIAGNOSIS — Z23 Encounter for immunization: Secondary | ICD-10-CM | POA: Diagnosis not present

## 2017-11-15 ENCOUNTER — Other Ambulatory Visit (INDEPENDENT_AMBULATORY_CARE_PROVIDER_SITE_OTHER): Payer: Self-pay | Admitting: Internal Medicine

## 2017-11-24 ENCOUNTER — Other Ambulatory Visit: Payer: Self-pay | Admitting: Cardiovascular Disease

## 2017-12-27 ENCOUNTER — Encounter (INDEPENDENT_AMBULATORY_CARE_PROVIDER_SITE_OTHER): Payer: Self-pay | Admitting: Internal Medicine

## 2017-12-28 DIAGNOSIS — J189 Pneumonia, unspecified organism: Secondary | ICD-10-CM

## 2017-12-28 HISTORY — DX: Pneumonia, unspecified organism: J18.9

## 2017-12-30 ENCOUNTER — Ambulatory Visit (HOSPITAL_COMMUNITY)
Admission: RE | Admit: 2017-12-30 | Discharge: 2017-12-30 | Disposition: A | Discharge status: Home / Self Care | Payer: Medicare Other | Source: Ambulatory Visit | Attending: Pulmonary Disease | Admitting: Pulmonary Disease

## 2017-12-30 ENCOUNTER — Other Ambulatory Visit (HOSPITAL_COMMUNITY): Payer: Self-pay | Admitting: Pulmonary Disease

## 2017-12-30 DIAGNOSIS — M7582 Other shoulder lesions, left shoulder: Secondary | ICD-10-CM | POA: Diagnosis not present

## 2017-12-30 DIAGNOSIS — N3281 Overactive bladder: Secondary | ICD-10-CM | POA: Diagnosis not present

## 2017-12-30 DIAGNOSIS — M25512 Pain in left shoulder: Secondary | ICD-10-CM

## 2017-12-30 DIAGNOSIS — I10 Essential (primary) hypertension: Secondary | ICD-10-CM | POA: Diagnosis not present

## 2017-12-30 DIAGNOSIS — M19012 Primary osteoarthritis, left shoulder: Secondary | ICD-10-CM | POA: Diagnosis not present

## 2017-12-30 DIAGNOSIS — I2510 Atherosclerotic heart disease of native coronary artery without angina pectoris: Secondary | ICD-10-CM | POA: Diagnosis not present

## 2017-12-31 ENCOUNTER — Other Ambulatory Visit (HOSPITAL_BASED_OUTPATIENT_CLINIC_OR_DEPARTMENT_OTHER): Payer: Self-pay

## 2017-12-31 DIAGNOSIS — R5383 Other fatigue: Secondary | ICD-10-CM

## 2018-01-06 ENCOUNTER — Ambulatory Visit (INDEPENDENT_AMBULATORY_CARE_PROVIDER_SITE_OTHER): Payer: Medicare Other | Admitting: Internal Medicine

## 2018-01-06 ENCOUNTER — Encounter (INDEPENDENT_AMBULATORY_CARE_PROVIDER_SITE_OTHER): Payer: Self-pay | Admitting: Internal Medicine

## 2018-01-06 VITALS — BP 142/76 | HR 64 | Temp 98.0°F | Ht 69.0 in | Wt 325.0 lb

## 2018-01-06 DIAGNOSIS — K760 Fatty (change of) liver, not elsewhere classified: Secondary | ICD-10-CM | POA: Diagnosis not present

## 2018-01-06 DIAGNOSIS — K219 Gastro-esophageal reflux disease without esophagitis: Secondary | ICD-10-CM

## 2018-01-06 NOTE — Progress Notes (Addendum)
Subjective:    Patient ID: Antonio Colon, male    DOB: 20-Sep-1951, 67 y.o.   MRN: [id number]  HPI Here today for f/u Last seen in January of last year. Hx of chronic GERD Wt in January of 2018 was 306.  Ht 69in Hx of Fatty liver:  Has gained about 19 pounds since his visit in January. He is going to start going to the [facility] to exercise. Appetite is good.  BMs x 1 a day. No melena or BRRB. Occasionally stools are loose.  10/27/2017 H and H 16.8 and 47.7. Bili 0.9, ALP 90, AST 21, ALT 27   [place] in January of 2018 revealed IMPRESSION: 1. No acute findings and largely negative sonographic appearance of the abdomen. 2. Liver echogenicity is within normal limits today, arguing against significant hepatic steatosis.  11/19/2015: US abdomen:   1. No acute abnormality identified. Specifically no evidence of gallstones or biliary distention.  2. Echogenic liver consistent with fatty infiltration and/or hepatocellular disease. Tiny cyst in the dome of the liver could not be visualized due to patient's body habitus .  11/18/2016EGD  Indications:Patient is 67 year old Caucasian male who was chronic GERD and has been doing well until EPI. He's been having postprandial epigastric pain associated with nausea and he has lost 12 pounds. Symptoms started after he lost his job. Lab studies were unremarkable including serum amylase and lipase. H. pylori serology was negative. Abdominal CT was unremarkable except small cyst in left lobe of the liver. He is on low-dose aspirin. He is undergoing diagnostic EGD. Patient states he is having less pain since pantoprazole dose was doubled. Impression: Small sliding hiatal hernia without evidence of erosive esophagitis. Multiple small hyperplastic appearing polyps at gastric body and fundus. These were left alone. No evidence of peptic ulcer disease or  gastritis.     6/19/2013Colonoscopy Indications:Patient is 67 year old Caucasian male was undergoing average risk screening colonoscopy.  Impression:  Normal terminal ileum and normal colonoscopy.      Review of Systems Past Medical History:  Diagnosis Date  . CAD (coronary artery disease)    stents  . GERD (gastroesophageal reflux disease)   . Heart disease   . Hiatal hernia   . History of stress test 01/27/2012   Normal study. No significant ischemia demonstration, this low risk scan, no significant change compared to previous study.  . Hypercholesteremia   . Hyperlipidemia   . Hypertension   . Obesity     Past Surgical History:  Procedure Laterality Date  . CARDIAC CATHETERIZATION  10/2009   CAD stents to the LAD using an Endeavor drug eluting (3x34mm) by Dr Rex Kras, he had normal circumflex and RCA at the time.  Marland Kitchen CAROTID STENT     x 6 yrs this month  . COLONOSCOPY  06/15/2012  . ESOPHAGOGASTRODUODENOSCOPY N/A 11/15/2015   Procedure: ESOPHAGOGASTRODUODENOSCOPY (EGD);  Surgeon: Rogene Houston, MD;  Location: [facility];  Service: Endoscopy;  Laterality: N/A;  1:10 - moved to 12:55 - Ann notified pt  . TONSILLECTOMY      No Known Allergies  Current Outpatient Medications on File Prior to Visit  Medication Sig Dispense Refill  . ALPRAZolam (XANAX) 0.25 MG tablet Take 1 tablet by mouth at bedtime. Take 1 tab daily    . aspirin EC 81 MG tablet Take 1 tablet (81 mg total) by mouth daily.    Marland Kitchen BYSTOLIC 5 MG tablet TAKE 1 TABLET BY MOUTH ONCE A DAY. 30 tablet 11  . losartan (  COZAAR) 50 MG tablet Take 50 mg by mouth daily.    . mirabegron ER (MYRBETRIQ) 25 MG TB24 Take 25 mg by mouth daily.    . nitroGLYCERIN (NITROSTAT) 0.4 MG SL tablet Place 1 tablet (0.4 mg total) under the tongue every 5 (five) minutes as needed. For chest pain 25 tablet 11  . pantoprazole (PROTONIX) 40 MG tablet TAKE ONE TABLET BY MOUTH TWICE DAILY BEFORE A MEAL. 60  tablet 6  . rosuvastatin (CRESTOR) 5 MG tablet TAKE 1 TABLET BY MOUTH AT BEDTIME FOR CHOLESTEROL. 30 tablet 11  . sucralfate (CARAFATE) 1 GM/10ML suspension Take 10 mLs (1 g total) by mouth 4 (four) times daily. 420 mL 1   No current facility-administered medications on file prior to visit.         Objective:   Physical Exam  Blood pressure (!) 142/76, pulse 64, temperature 98 F (36.7 C), height 5\' 9"  (1.753 m), weight (!) 325 lb (147.4 kg). Alert and oriented. Skin warm and dry. Oral mucosa is moist.   . Sclera anicteric, conjunctivae is pink. Thyroid not enlarged. No cervical lymphadenopathy. Lungs clear. Heart regular rate and rhythm.  Abdomen is soft. Bowel sounds are positive. No hepatomegaly. No abdominal masses felt. No tenderness.  No edema to lower extremities.          Assessment & Plan:  GERD: Continue the Protoinix Fatty Liver : Continue to diet and exercise. OV in 1 year.  Will labs from Dr. Luan Pulling.

## 2018-01-06 NOTE — Patient Instructions (Signed)
OV in 1 year.  

## 2018-01-31 ENCOUNTER — Telehealth: Payer: Self-pay | Admitting: Cardiovascular Disease

## 2018-01-31 NOTE — Telephone Encounter (Addendum)
Spoke with Antonio Colon and she was wanting to know if premed needed for patient who had a stent placed in 2010. Advised no premed needed for stent.

## 2018-01-31 NOTE — Telephone Encounter (Signed)
Pt is in the office now,she needs to speak to somebody asap please.

## 2018-05-14 ENCOUNTER — Emergency Department (HOSPITAL_COMMUNITY)
Admission: EM | Admit: 2018-05-14 | Discharge: 2018-05-15 | Disposition: A | Discharge status: Home / Self Care | Payer: Medicare Other | Attending: Emergency Medicine | Admitting: Emergency Medicine

## 2018-05-14 ENCOUNTER — Encounter (HOSPITAL_COMMUNITY): Payer: Self-pay | Admitting: Emergency Medicine

## 2018-05-14 ENCOUNTER — Other Ambulatory Visit: Payer: Self-pay

## 2018-05-14 ENCOUNTER — Emergency Department (HOSPITAL_COMMUNITY): Payer: Medicare Other

## 2018-05-14 DIAGNOSIS — R05 Cough: Secondary | ICD-10-CM | POA: Diagnosis not present

## 2018-05-14 DIAGNOSIS — I10 Essential (primary) hypertension: Secondary | ICD-10-CM | POA: Diagnosis not present

## 2018-05-14 DIAGNOSIS — I2510 Atherosclerotic heart disease of native coronary artery without angina pectoris: Secondary | ICD-10-CM | POA: Insufficient documentation

## 2018-05-14 DIAGNOSIS — Z79899 Other long term (current) drug therapy: Secondary | ICD-10-CM | POA: Insufficient documentation

## 2018-05-14 DIAGNOSIS — Z7982 Long term (current) use of aspirin: Secondary | ICD-10-CM | POA: Diagnosis not present

## 2018-05-14 DIAGNOSIS — Z87891 Personal history of nicotine dependence: Secondary | ICD-10-CM | POA: Diagnosis not present

## 2018-05-14 DIAGNOSIS — J189 Pneumonia, unspecified organism: Secondary | ICD-10-CM | POA: Insufficient documentation

## 2018-05-14 DIAGNOSIS — Z955 Presence of coronary angioplasty implant and graft: Secondary | ICD-10-CM | POA: Insufficient documentation

## 2018-05-14 MED ORDER — IPRATROPIUM-ALBUTEROL 0.5-2.5 (3) MG/3ML IN SOLN
3.0000 mL | Freq: Once | RESPIRATORY_TRACT | Status: AC
  Administered 2018-05-14: 3 mL via RESPIRATORY_TRACT
  Filled 2018-05-14: qty 3

## 2018-05-14 NOTE — ED Triage Notes (Signed)
Patient complaining of cough, sore throat, and diarrhea x 1 week.

## 2018-05-14 NOTE — ED Provider Notes (Signed)
[facility] EMERGENCY DEPARTMENT Provider Note   CSN: [id number] Arrival date & time: 05/14/18  2122     History   Chief Complaint Chief Complaint  Patient presents with  . Cough    HPI Antonio Colon is a 67 y.o. male.  Patient with history of CAD with stents, hyperlipidemia, high cholesterol, hypertension, obesity presenting with a one-week history of cough productive of clear and yellow mucus, sore throat and intermittent diarrhea.  Has not had a fever.  Denies any shortness of breath.  Patient called his PCP was given a prescription for Zithromax which she finished today.  He came in tonight because the coughing is worse keeping him awake.  Patient states he normally sleeps in a recliner but has never been diagnosed with heart failure.  He does not take any diuretics at home.  He denies any chest pain but has some abdominal soreness from coughing.  He has had sick contacts at home.  No leg swelling or leg pain.  He is due to see his PCP in 3 days.  The history is provided by the patient.  Cough  Associated symptoms include rhinorrhea and sore throat. Pertinent negatives include no chest pain, no headaches and no myalgias.    Past Medical History:  Diagnosis Date  . CAD (coronary artery disease)    stents  . GERD (gastroesophageal reflux disease)   . Heart disease   . Hiatal hernia   . History of stress test 01/27/2012   Normal study. No significant ischemia demonstration, this low risk scan, no significant change compared to previous study.  . Hypercholesteremia   . Hyperlipidemia   . Hypertension   . Obesity     Patient Active Problem List   Diagnosis Date Noted  . NAFLD (nonalcoholic fatty liver disease) 01/26/2012  . GERD (gastroesophageal reflux disease) 01/26/2012  . CAD (coronary artery disease) 01/26/2012  . Hyperlipemia 01/26/2012  . HTN (hypertension) 01/26/2012  . FRACTURE, MEDIAL MALLEOLUS 11/20/2008  . ANKLE SPRAIN, LEFT 11/20/2008    Past Surgical  History:  Procedure Laterality Date  . CARDIAC CATHETERIZATION  10/2009   CAD stents to the LAD using an Endeavor drug eluting (3x25mm) by Dr Rex Kras, he had normal circumflex and RCA at the time.  Marland Kitchen CAROTID STENT     x 6 yrs this month  . COLONOSCOPY  06/15/2012  . ESOPHAGOGASTRODUODENOSCOPY N/A 11/15/2015   Procedure: ESOPHAGOGASTRODUODENOSCOPY (EGD);  Surgeon: Rogene Houston, MD;  Location: [facility];  Service: Endoscopy;  Laterality: N/A;  1:10 - moved to 12:55 - Ann notified pt  . TONSILLECTOMY          Home Medications    Prior to Admission medications   Medication Sig Start Date End Date Taking? Authorizing Provider  ALPRAZolam Duanne Moron) 0.25 MG tablet Take 1 tablet by mouth at bedtime. Take 1 tab daily 06/24/15  Yes [provider]  aspirin EC 81 MG tablet Take 1 tablet (81 mg total) by mouth daily. 11/15/15  Yes Rehman, Mechele Dawley, MD  BYSTOLIC 5 MG tablet TAKE 1 TABLET BY MOUTH ONCE A DAY. 11/24/17  Yes Lorretta Harp, MD  mirabegron ER (MYRBETRIQ) 25 MG TB24 Take 25 mg by mouth daily.   Yes [provider]  olmesartan (BENICAR) 20 MG tablet Take 1 tablet by mouth daily. 04/16/18  Yes [provider]  pantoprazole (PROTONIX) 40 MG tablet TAKE ONE TABLET BY MOUTH TWICE DAILY BEFORE A MEAL. 11/15/17  Yes Setzer, Rona Ravens, NP  rosuvastatin (CRESTOR) 5 MG tablet TAKE 1 TABLET BY MOUTH AT BEDTIME FOR CHOLESTEROL. 08/17/17  Yes Lorretta Harp, MD  azithromycin (ZITHROMAX) 250 MG tablet  05/09/18   [provider]  nitroGLYCERIN (NITROSTAT) 0.4 MG SL tablet Place 1 tablet (0.4 mg total) under the tongue every 5 (five) minutes as needed. For chest pain 07/05/13   Lorretta Harp, MD  pantoprazole (PROTONIX) 40 MG tablet Take 1 tablet by mouth daily.    [provider]    Family History Family History  Problem Relation Age of Onset  . Heart disease Mother   . Healthy Sister     Social History Social History   Tobacco Use  .  Smoking status: Former Smoker    Types: Cigarettes, Cigars    Last attempt to quit: 01/25/2009    Years since quitting: 9.3  . Smokeless tobacco: Never Used  Substance Use Topics  . Alcohol use: No  . Drug use: No     Allergies   Patient has no known allergies.   Review of Systems Review of Systems  Constitutional: Negative for activity change, appetite change and fever.  HENT: Positive for rhinorrhea and sore throat.   Respiratory: Positive for cough.   Cardiovascular: Negative for chest pain and leg swelling.  Gastrointestinal: Negative for abdominal pain, nausea and vomiting.  Genitourinary: Negative for dysuria and hematuria.  Musculoskeletal: Negative for arthralgias and myalgias.  Skin: Negative for rash.  Neurological: Negative for dizziness, tremors and headaches.   all other systems are negative except as noted in the HPI and PMH.    Physical Exam Updated Vital Signs BP (!) 147/68 (BP Location: Right Arm)   Pulse 74   Temp 98.3 F (36.8 C) (Oral)   Resp 20   Wt (!) 147.4 kg (325 lb)   SpO2 96%   BMI 47.99 kg/m   Physical Exam  Constitutional: He is oriented to person, place, and time. He appears well-developed and well-nourished. No distress.  Obese No distress Speaking in full sentences  HENT:  Head: Normocephalic and atraumatic.  Mouth/Throat: Oropharynx is clear and moist. No oropharyngeal exudate.  Eyes: Pupils are equal, round, and reactive to light. Conjunctivae and EOM are normal.  Neck: Normal range of motion. Neck supple.  No meningismus.  Cardiovascular: Normal rate, regular rhythm, normal heart sounds and intact distal pulses.  No murmur heard. Pulmonary/Chest: Effort normal and breath sounds normal. No respiratory distress.  Scattered rhonchi  Abdominal: Soft. There is no tenderness. There is no rebound and no guarding.  Musculoskeletal: Normal range of motion. He exhibits no edema or tenderness.  No peripheral edema, intact distal pulses    Neurological: He is alert and oriented to person, place, and time. No cranial nerve deficit. He exhibits normal muscle tone. Coordination normal.  No ataxia on finger to nose bilaterally. No pronator drift. 5/5 strength throughout. CN 2-12 intact.Equal grip strength. Sensation intact.   Skin: Skin is warm. Capillary refill takes less than 2 seconds. No pallor.  Psychiatric: He has a normal mood and affect. His behavior is normal.  Nursing note and vitals reviewed.    ED Treatments / Results  Labs (all labs ordered are listed, but only abnormal results are displayed) Labs Reviewed  BASIC METABOLIC PANEL - Abnormal; Notable for the following components:      Result Value   Glucose, Bld 112 (*)    BUN 21 (*)    Calcium 8.7 (*)    All other components within  normal limits  CBC WITH DIFFERENTIAL/PLATELET  BRAIN NATRIURETIC PEPTIDE  TROPONIN I  D-DIMER, QUANTITATIVE (NOT AT [facility])    EKG EKG Interpretation  Date/Time:  Sunday May 15 2018 01:02:04 EDT Ventricular Rate:  77 PR Interval:    QRS Duration: 90 QT Interval:  377 QTC Calculation: 427 R Axis:   11 Text Interpretation:  Sinus rhythm Prolonged PR interval Low voltage, precordial leads Consider anterior infarct No significant change was found Confirmed by Ezequiel Essex [phone]) on 05/15/2018 1:13:28 AM   Radiology Dg Chest 2 View  Result Date: 05/14/2018 CLINICAL DATA:  Acute onset of cough, sore throat and diarrhea. EXAM: CHEST - 2 VIEW COMPARISON:  Chest radiograph performed 08/05/2015 FINDINGS: The lungs are well-aerated. Mild vascular congestion is noted. Mild bibasilar airspace opacities may reflect mild pneumonia. There is no evidence of pleural effusion or pneumothorax. The heart is normal in size; the mediastinal contour is within normal limits. No acute osseous abnormalities are seen. IMPRESSION: Mild vascular congestion noted. Mild bibasilar airspace opacities may reflect mild pneumonia. Electronically Signed   By:  Garald Balding M.D.   On: 05/14/2018 22:04    Procedures Procedures (including critical care time)  Medications Ordered in ED Medications  ipratropium-albuterol (DUONEB) 0.5-2.5 (3) MG/3ML nebulizer solution 3 mL (has no administration in time range)     Initial Impression / Assessment and Plan / ED Course  I have reviewed the triage vital signs and the nursing notes.  Pertinent labs & imaging results that were available during my care of the patient were reviewed by me and considered in my medical decision making (see chart for details).    1 week of productive cough, sore throat and congestion.  No chest pain.  Patient is no distress and no hypoxia.  He is not wheezing.  Chest x-ray obtained shows mild vascular congestion No echocardiogram available in system  EKG is reassuring and labs are within normal limits.  D-dimer is negative.  Patient will be treated for likely pneumonia.  We will also give short trial of Lasix as difficult to exclude component of CHF.  Patient with unknown ejection fraction.  No hypoxia with ambulation.  Patient has PCP follow-up in 3 days.  Return precautions discussed.  Final Clinical Impressions(s) / ED Diagnoses   Final diagnoses:  Community acquired pneumonia, unspecified laterality    ED Discharge Orders    None       Willo Yoon, Annie Main, MD 05/15/18 [phone]

## 2018-05-15 LAB — CBC WITH DIFFERENTIAL/PLATELET
BASOS PCT: 0 %
Basophils Absolute: 0 10*3/uL (ref 0.0–0.1)
EOS ABS: 0.4 10*3/uL (ref 0.0–0.7)
Eosinophils Relative: 4 %
HEMATOCRIT: 45.9 % (ref 39.0–52.0)
Hemoglobin: 15.3 g/dL (ref 13.0–17.0)
LYMPHS ABS: 3.2 10*3/uL (ref 0.7–4.0)
Lymphocytes Relative: 32 %
MCH: 29.5 pg (ref 26.0–34.0)
MCHC: 33.3 g/dL (ref 30.0–36.0)
MCV: 88.4 fL (ref 78.0–100.0)
MONO ABS: 0.9 10*3/uL (ref 0.1–1.0)
MONOS PCT: 9 %
NEUTROS ABS: 5.3 10*3/uL (ref 1.7–7.7)
Neutrophils Relative %: 55 %
Platelets: 231 10*3/uL (ref 150–400)
RBC: 5.19 MIL/uL (ref 4.22–5.81)
RDW: 13.2 % (ref 11.5–15.5)
WBC: 9.8 10*3/uL (ref 4.0–10.5)

## 2018-05-15 LAB — BASIC METABOLIC PANEL
Anion gap: 9 (ref 5–15)
BUN: 21 mg/dL — AB (ref 6–20)
CO2: 26 mmol/L (ref 22–32)
CREATININE: 1 mg/dL (ref 0.61–1.24)
Calcium: 8.7 mg/dL — ABNORMAL LOW (ref 8.9–10.3)
Chloride: 106 mmol/L (ref 101–111)
GLUCOSE: 112 mg/dL — AB (ref 65–99)
Potassium: 3.7 mmol/L (ref 3.5–5.1)
Sodium: 141 mmol/L (ref 135–145)

## 2018-05-15 LAB — D-DIMER, QUANTITATIVE: D-Dimer, Quant: 0.33 ug/mL-FEU (ref 0.00–0.50)

## 2018-05-15 LAB — BRAIN NATRIURETIC PEPTIDE: B Natriuretic Peptide: 18 pg/mL (ref 0.0–100.0)

## 2018-05-15 LAB — TROPONIN I: Troponin I: <0.03 ng/mL (ref <0.03)

## 2018-05-15 MED ORDER — DOXYCYCLINE HYCLATE 100 MG PO TABS
100.0000 mg | ORAL_TABLET | Freq: Once | ORAL | Status: AC
  Administered 2018-05-15: 100 mg via ORAL
  Filled 2018-05-15: qty 1

## 2018-05-15 MED ORDER — FUROSEMIDE 40 MG PO TABS
40.0000 mg | ORAL_TABLET | Freq: Once | ORAL | Status: AC
  Administered 2018-05-15: 40 mg via ORAL
  Filled 2018-05-15: qty 1

## 2018-05-15 MED ORDER — FUROSEMIDE 40 MG PO TABS: 40.0000 mg | ORAL_TABLET | Freq: Every day | ORAL | 0 refills | Status: DC

## 2018-05-15 MED ORDER — DOXYCYCLINE HYCLATE 100 MG PO CAPS: 100.0000 mg | ORAL_CAPSULE | Freq: Two times a day (BID) | ORAL | 0 refills | Status: DC

## 2018-05-15 NOTE — ED Notes (Signed)
Patient ambulated around nursing station on room air, oxygen 90-94%.

## 2018-05-15 NOTE — Discharge Instructions (Addendum)
Take the antibiotics and water pills as prescribed.  Follow-up with Dr. Luan Pulling as scheduled.  Asking about obtaining an echocardiogram to check your heart pumping function.  Return to the ED if you develop chest pain, shortness of breath or any other concerns.

## 2018-05-17 ENCOUNTER — Other Ambulatory Visit (HOSPITAL_COMMUNITY): Payer: Self-pay | Admitting: Pulmonary Disease

## 2018-05-17 DIAGNOSIS — J189 Pneumonia, unspecified organism: Secondary | ICD-10-CM | POA: Diagnosis not present

## 2018-05-17 DIAGNOSIS — I2510 Atherosclerotic heart disease of native coronary artery without angina pectoris: Secondary | ICD-10-CM | POA: Diagnosis not present

## 2018-05-17 DIAGNOSIS — I10 Essential (primary) hypertension: Secondary | ICD-10-CM | POA: Diagnosis not present

## 2018-05-24 ENCOUNTER — Ambulatory Visit (HOSPITAL_COMMUNITY)
Admission: RE | Admit: 2018-05-24 | Discharge: 2018-05-24 | Disposition: A | Discharge status: Home / Self Care | Payer: Medicare Other | Source: Ambulatory Visit | Attending: Pulmonary Disease | Admitting: Pulmonary Disease

## 2018-05-24 DIAGNOSIS — I358 Other nonrheumatic aortic valve disorders: Secondary | ICD-10-CM | POA: Diagnosis not present

## 2018-05-24 DIAGNOSIS — I119 Hypertensive heart disease without heart failure: Secondary | ICD-10-CM | POA: Insufficient documentation

## 2018-05-24 DIAGNOSIS — Z87891 Personal history of nicotine dependence: Secondary | ICD-10-CM | POA: Diagnosis not present

## 2018-05-24 DIAGNOSIS — E785 Hyperlipidemia, unspecified: Secondary | ICD-10-CM | POA: Insufficient documentation

## 2018-05-24 DIAGNOSIS — K219 Gastro-esophageal reflux disease without esophagitis: Secondary | ICD-10-CM | POA: Diagnosis not present

## 2018-05-24 DIAGNOSIS — I2510 Atherosclerotic heart disease of native coronary artery without angina pectoris: Secondary | ICD-10-CM | POA: Insufficient documentation

## 2018-05-24 MED ORDER — PERFLUTREN LIPID MICROSPHERE
1.0000 mL | INTRAVENOUS | Status: AC | PRN
  Administered 2018-05-24: 2 mL via INTRAVENOUS

## 2018-05-24 NOTE — Progress Notes (Signed)
*  PRELIMINARY RESULTS* Echocardiogram 2D Echocardiogram with definity has been performed.  Leavy Cella 05/24/2018, 4:19 PM

## 2018-06-01 ENCOUNTER — Ambulatory Visit: Payer: Medicare Other | Admitting: Cardiology

## 2018-06-01 ENCOUNTER — Encounter: Payer: Self-pay | Admitting: Cardiology

## 2018-06-01 DIAGNOSIS — Z9861 Coronary angioplasty status: Secondary | ICD-10-CM | POA: Diagnosis not present

## 2018-06-01 DIAGNOSIS — J9690 Respiratory failure, unspecified, unspecified whether with hypoxia or hypercapnia: Secondary | ICD-10-CM | POA: Insufficient documentation

## 2018-06-01 DIAGNOSIS — E782 Mixed hyperlipidemia: Secondary | ICD-10-CM

## 2018-06-01 DIAGNOSIS — I10 Essential (primary) hypertension: Secondary | ICD-10-CM | POA: Diagnosis not present

## 2018-06-01 DIAGNOSIS — I2510 Atherosclerotic heart disease of native coronary artery without angina pectoris: Secondary | ICD-10-CM

## 2018-06-01 DIAGNOSIS — J9600 Acute respiratory failure, unspecified whether with hypoxia or hypercapnia: Secondary | ICD-10-CM | POA: Diagnosis not present

## 2018-06-01 NOTE — Assessment & Plan Note (Signed)
Seen in the ED at [facility] with CAP and superimposed CHF

## 2018-06-01 NOTE — Assessment & Plan Note (Signed)
Controlled.  

## 2018-06-01 NOTE — Assessment & Plan Note (Signed)
LAD PCI with DES 2010, Myoview low risk 2013

## 2018-06-01 NOTE — Patient Instructions (Signed)
Medication Instructions: Your physician recommends that you continue on your current medications as directed. Please refer to the Current Medication list given to you today.  If you need a refill on your cardiac medications before your next appointment, please call your pharmacy.    Follow-Up: Your physician wants you to follow-up in 6 months with Dr. Andria Rhein will receive a reminder letter in the mail two months in advance. If you don't receive a letter, please call our office at [phone] to schedule this follow-up appointment.   Special Instructions:    Thank you for choosing [facility] at [facility]!!

## 2018-06-01 NOTE — Progress Notes (Signed)
06/01/2018 Antonio Colon   06/05/1951  [id number]  Primary Physician Sinda Du, MD Primary Cardiologist: Dr Gwenlyn Found  HPI:  67 y/o male with a history of CAD, s/p LAD DES in 2010 by Dr Rex Kras. Myoview in 2013 was low risk. He sees Dr Gwenlyn Found once a year in Aug. He is in the office today after a recent admission at [facility] for CAP and suspected CHF. He was treated with ABs, steroids, and two doses of Lasix. He has done well since, he has also followed up with Dr Luan Pulling. He goes to the [facility] three times a week or more. He does cardio 30-60 minutes without problems. He denies orthopnea or LE edema.  He denies any chest pain.    Current Outpatient Medications  Medication Sig Dispense Refill  . ALPRAZolam (XANAX) 0.25 MG tablet Take 1 tablet by mouth at bedtime. Take 1 tab daily    . aspirin EC 81 MG tablet Take 1 tablet (81 mg total) by mouth daily.    Marland Kitchen azithromycin (ZITHROMAX) 250 MG tablet     . BYSTOLIC 5 MG tablet TAKE 1 TABLET BY MOUTH ONCE A DAY. 30 tablet 11  . doxycycline (VIBRAMYCIN) 100 MG capsule Take 1 capsule (100 mg total) by mouth 2 (two) times daily. 20 capsule 0  . furosemide (LASIX) 40 MG tablet Take 1 tablet (40 mg total) by mouth daily. 2 tablet 0  . losartan (COZAAR) 100 MG tablet 50 mg.     . mirabegron ER (MYRBETRIQ) 25 MG TB24 Take 25 mg by mouth daily.    . nitroGLYCERIN (NITROSTAT) 0.4 MG SL tablet Place 1 tablet (0.4 mg total) under the tongue every 5 (five) minutes as needed. For chest pain 25 tablet 11  . pantoprazole (PROTONIX) 40 MG tablet TAKE ONE TABLET BY MOUTH TWICE DAILY BEFORE A MEAL. 60 tablet 6  . pantoprazole (PROTONIX) 40 MG tablet Take 1 tablet by mouth daily.    . rosuvastatin (CRESTOR) 5 MG tablet TAKE 1 TABLET BY MOUTH AT BEDTIME FOR CHOLESTEROL. 30 tablet 11   No current facility-administered medications for this visit.     No Known Allergies  Past Medical History:  Diagnosis Date  . CAD (coronary artery disease)    stents  .  GERD (gastroesophageal reflux disease)   . Heart disease   . Hiatal hernia   . History of stress test 01/27/2012   Normal study. No significant ischemia demonstration, this low risk scan, no significant change compared to previous study.  . Hypercholesteremia   . Hyperlipidemia   . Hypertension   . Obesity     Social History   Socioeconomic History  . Marital status: Married    Spouse name: Not on file  . Number of children: Not on file  . Years of education: Not on file  . Highest education level: Not on file  Occupational History  . Not on file  Social Needs  . Financial resource strain: Not on file  . Food insecurity:    Worry: Not on file    Inability: Not on file  . Transportation needs:    Medical: Not on file    Non-medical: Not on file  Tobacco Use  . Smoking status: Former Smoker    Types: Cigarettes, Cigars    Last attempt to quit: 01/25/2009    Years since quitting: 9.3  . Smokeless tobacco: Never Used  Substance and Sexual Activity  . Alcohol use: No  . Drug use: No  .  Sexual activity: Not on file  Lifestyle  . Physical activity:    Days per week: Not on file    Minutes per session: Not on file  . Stress: Not on file  Relationships  . Social connections:    Talks on phone: Not on file    Gets together: Not on file    Attends religious service: Not on file    Active member of club or organization: Not on file    Attends meetings of clubs or organizations: Not on file    Relationship status: Not on file  . Intimate partner violence:    Fear of current or ex partner: Not on file    Emotionally abused: Not on file    Physically abused: Not on file    Forced sexual activity: Not on file  Other Topics Concern  . Not on file  Social History Narrative  . Not on file     Family History  Problem Relation Age of Onset  . Heart disease Mother   . Healthy Sister      Review of Systems: General: negative for chills, fever, night sweats or weight  changes.  Cardiovascular: negative for chest pain, dyspnea on exertion, edema, orthopnea, palpitations, paroxysmal nocturnal dyspnea or shortness of breath Dermatological: negative for rash Respiratory: negative for cough or wheezing Urologic: negative for hematuria Abdominal: negative for nausea, vomiting, diarrhea, bright red blood per rectum, melena, or hematemesis Neurologic: negative for visual changes, syncope, or dizziness Rt shoulder DJD Sleep apnea in the past All other systems reviewed and are otherwise negative except as noted above.    Blood pressure 140/82, pulse 74, height 5\' 9"  (1.753 m), weight (!) 324 lb (147 kg).  General appearance: alert, cooperative, no distress and morbidly obese Neck: no carotid bruit and no JVD Extremities: extremities normal, atraumatic, no cyanosis or edema Skin: Skin color, texture, turgor normal. No rashes or lesions Neurologic: Grossly normal   ASSESSMENT AND PLAN:   Respiratory failure (HCC) Seen in the ED at [facility] with CAP and superimposed CHF  CAD S/P percutaneous coronary angioplasty LAD PCI with DES 2010, Myoview low risk 2013  HTN (hypertension) Controlled  Hyperlipemia On low dose statin, followed by Dr Luan Pulling  Morbid obesity ([place]) BMI 47. He had sleep apnea in the past and was on C-pap. He lost weight and came off the C-pap. His weight is now back up   PLAN  I did not change his current medications. He is not taking Lasix and only took it for two doses. I suggested he discuss another sleep study with Dr Luan Pulling and he agreed to bring that up at his next OV. We discussed the importance of a low salt diet. Keep f/u with Dr Gwenlyn Found in Aug.   Kerin Ransom PA-C 06/01/2018 3:50 PM

## 2018-06-01 NOTE — Assessment & Plan Note (Signed)
On low dose statin, followed by Dr Luan Pulling

## 2018-06-01 NOTE — Assessment & Plan Note (Signed)
BMI 47. He had sleep apnea in the past and was on C-pap. He lost weight and came off the C-pap. His weight is now back up

## 2018-06-07 MED ORDER — PERFLUTREN LIPID MICROSPHERE
1.0000 mL | INTRAVENOUS | Status: AC | PRN
  Administered 2018-05-24: 2 mL via INTRAVENOUS

## 2018-06-15 ENCOUNTER — Ambulatory Visit (HOSPITAL_COMMUNITY)
Admission: RE | Admit: 2018-06-15 | Discharge: 2018-06-15 | Disposition: A | Discharge status: Home / Self Care | Payer: Medicare Other | Source: Ambulatory Visit | Attending: Pulmonary Disease | Admitting: Pulmonary Disease

## 2018-06-15 ENCOUNTER — Other Ambulatory Visit (HOSPITAL_COMMUNITY): Payer: Self-pay | Admitting: Respiratory Therapy

## 2018-06-15 ENCOUNTER — Other Ambulatory Visit (HOSPITAL_COMMUNITY): Payer: Self-pay | Admitting: Pulmonary Disease

## 2018-06-15 DIAGNOSIS — J189 Pneumonia, unspecified organism: Secondary | ICD-10-CM | POA: Diagnosis not present

## 2018-06-15 DIAGNOSIS — R0602 Shortness of breath: Secondary | ICD-10-CM

## 2018-06-20 DIAGNOSIS — I2510 Atherosclerotic heart disease of native coronary artery without angina pectoris: Secondary | ICD-10-CM | POA: Diagnosis not present

## 2018-06-20 DIAGNOSIS — I10 Essential (primary) hypertension: Secondary | ICD-10-CM | POA: Diagnosis not present

## 2018-06-21 DIAGNOSIS — I10 Essential (primary) hypertension: Secondary | ICD-10-CM | POA: Diagnosis not present

## 2018-06-21 DIAGNOSIS — I2510 Atherosclerotic heart disease of native coronary artery without angina pectoris: Secondary | ICD-10-CM | POA: Diagnosis not present

## 2018-06-28 ENCOUNTER — Ambulatory Visit (HOSPITAL_COMMUNITY)
Admission: RE | Admit: 2018-06-28 | Discharge: 2018-06-28 | Disposition: A | Discharge status: Home / Self Care | Payer: Medicare Other | Source: Ambulatory Visit | Attending: Pulmonary Disease | Admitting: Pulmonary Disease

## 2018-06-28 DIAGNOSIS — R0602 Shortness of breath: Secondary | ICD-10-CM

## 2018-06-28 LAB — PULMONARY FUNCTION TEST
DL/VA % pred: 108 %
DL/VA: 4.93 ml/min/mmHg/L
DLCO unc % pred: 135 %
DLCO unc: 41.89 ml/min/mmHg
FEF 25-75 Post: 1.56 L/sec
FEF 25-75 Pre: 2.39 L/sec
FEF2575-%Change-Post: -34 %
FEF2575-%Pred-Post: 62 %
FEF2575-%Pred-Pre: 94 %
FEV1-%CHANGE-POST: -18 %
FEV1-%PRED-POST: 79 %
FEV1-%Pred-Pre: 98 %
FEV1-POST: 2.58 L
FEV1-PRE: 3.18 L
FEV1FVC-%CHANGE-POST: -18 %
FEV1FVC-%Pred-Pre: 99 %
FEV6-%CHANGE-POST: 0 %
FEV6-%Pred-Post: 102 %
FEV6-%Pred-Pre: 103 %
FEV6-Post: 4.23 L
FEV6-Pre: 4.26 L
FEV6FVC-%Change-Post: 0 %
FEV6FVC-%PRED-PRE: 104 %
FEV6FVC-%Pred-Post: 105 %
FVC-%CHANGE-POST: 0 %
FVC-%PRED-PRE: 99 %
FVC-%Pred-Post: 98 %
FVC-POST: 4.28 L
FVC-PRE: 4.32 L
POST FEV6/FVC RATIO: 99 %
Post FEV1/FVC ratio: 60 %
Pre FEV1/FVC ratio: 74 %
Pre FEV6/FVC Ratio: 99 %
RV % PRED: 96 %
RV: 2.27 L
TLC % pred: 100 %
TLC: 6.86 L

## 2018-06-28 MED ORDER — ALBUTEROL SULFATE (2.5 MG/3ML) 0.083% IN NEBU
2.5000 mg | INHALATION_SOLUTION | Freq: Once | RESPIRATORY_TRACT | Status: AC
  Administered 2018-06-28: 2.5 mg via RESPIRATORY_TRACT

## 2018-07-15 ENCOUNTER — Other Ambulatory Visit: Payer: Self-pay | Admitting: Cardiovascular Disease

## 2018-07-15 NOTE — Telephone Encounter (Signed)
Rx sent to pharmacy   

## 2018-08-02 ENCOUNTER — Ambulatory Visit: Payer: Medicare Other | Attending: Pulmonary Disease | Admitting: Neurology

## 2018-08-02 DIAGNOSIS — Z7982 Long term (current) use of aspirin: Secondary | ICD-10-CM | POA: Diagnosis not present

## 2018-08-02 DIAGNOSIS — Z79899 Other long term (current) drug therapy: Secondary | ICD-10-CM | POA: Insufficient documentation

## 2018-08-02 DIAGNOSIS — Z792 Long term (current) use of antibiotics: Secondary | ICD-10-CM | POA: Diagnosis not present

## 2018-08-02 DIAGNOSIS — R5383 Other fatigue: Secondary | ICD-10-CM

## 2018-08-02 DIAGNOSIS — G4733 Obstructive sleep apnea (adult) (pediatric): Secondary | ICD-10-CM | POA: Insufficient documentation

## 2018-08-07 NOTE — Procedures (Signed)
[place] A. Merlene Laughter, MD     [email or web address]             NOCTURNAL POLYSOMNOGRAPHY   LOCATION: [facility]  Patient Name: Antonio Colon, Antonio Colon Date: 08/02/2018 Gender: Male D.O.B: [birth year] Age (years): 67 Referring Provider: Sinda Du Height (inches): 69 Interpreting Physician: Phillips Odor MD, ABSM Weight (lbs): 324 RPSGT: Rosebud Poles BMI: 48 MRN: [id number] Neck Size: 20.00 <br> <br> CLINICAL INFORMATION Sleep Study Type: NPSG    Indication for sleep study: N/A    Epworth Sleepiness Score: 5    SLEEP STUDY TECHNIQUE As per the AASM Manual for the Scoring of Sleep and Associated Events v2.3 (April 2016) with a hypopnea requiring 4% desaturations.  The channels recorded and monitored were frontal, central and occipital EEG, electrooculogram (EOG), submentalis EMG (chin), nasal and oral airflow, thoracic and abdominal wall motion, anterior tibialis EMG, snore microphone, electrocardiogram, and pulse oximetry.  MEDICATIONS Medications self-administered by patient taken the night of the study : N/A  Current Outpatient Medications:  .  ALPRAZolam (XANAX) 0.25 MG tablet, Take 1 tablet by mouth at bedtime. Take 1 tab daily, Disp: , Rfl:  .  aspirin EC 81 MG tablet, Take 1 tablet (81 mg total) by mouth daily., Disp: , Rfl:  .  azithromycin (ZITHROMAX) 250 MG tablet, , Disp: , Rfl:  .  BYSTOLIC 5 MG tablet, TAKE 1 TABLET BY MOUTH ONCE A DAY., Disp: 30 tablet, Rfl: 11 .  doxycycline (VIBRAMYCIN) 100 MG capsule, Take 1 capsule (100 mg total) by mouth 2 (two) times daily., Disp: 20 capsule, Rfl: 0 .  furosemide (LASIX) 40 MG tablet, Take 1 tablet (40 mg total) by mouth daily., Disp: 2 tablet, Rfl: 0 .  losartan (COZAAR) 100 MG tablet, 50 mg. , Disp: , Rfl:  .  mirabegron ER (MYRBETRIQ) 25 MG TB24, Take 25 mg by mouth daily., Disp: , Rfl:  .  nitroGLYCERIN (NITROSTAT) 0.4 MG SL tablet, Place 1 tablet (0.4 mg total) under the tongue every 5  (five) minutes as needed. For chest pain, Disp: 25 tablet, Rfl: 11 .  pantoprazole (PROTONIX) 40 MG tablet, TAKE ONE TABLET BY MOUTH TWICE DAILY BEFORE A MEAL., Disp: 60 tablet, Rfl: 6 .  pantoprazole (PROTONIX) 40 MG tablet, Take 1 tablet by mouth daily., Disp: , Rfl:  .  rosuvastatin (CRESTOR) 5 MG tablet, TAKE 1 TABLET BY MOUTH AT BEDTIME FOR CHOLESTEROL., Disp: 30 tablet, Rfl: 3    SLEEP ARCHITECTURE The study was initiated at 9:55:20 PM and ended at 4:38:27 AM.  Sleep onset time was 4.7 minutes and the sleep efficiency was 73.3%%. The total sleep time was 295.5 minutes.  Stage REM latency was 149.5 minutes.  The patient spent 8.3%% of the night in stage N1 sleep, 62.6%% in stage N2 sleep, 10.8%% in stage N3 and 18.3% in REM.  Alpha intrusion was absent.  Supine sleep was 0.34%.  RESPIRATORY PARAMETERS The overall apnea/hypopnea index (AHI) was 24.4 per hour. There were 26 total apneas, including 24 obstructive, 2 central and 0 mixed apneas. There were 94 hypopneas and 1 RERAs.  The AHI during Stage REM sleep was 55.6 per hour.  AHI while supine was 120.0 per hour.  The mean oxygen saturation was 91.8%. The minimum SpO2 during sleep was 76.0%.  loud snoring was noted during this study.  CARDIAC DATA The 2 lead EKG demonstrated sinus rhythm. The mean heart rate was 64.6 beats per minute. Other EKG findings include: None. LEG MOVEMENT DATA The total  PLMS were 0 with a resulting PLMS index of 0.0. Associated arousal with leg movement index was 6.3.  IMPRESSIONS - Moderate obstructive sleep apnea is observed. A formal CPAP titration study is recommended.    Delano Metz, MD Diplomate, American Board of Sleep Medicine.  ELECTRONICALLY SIGNED ON:  08/07/2018, 8:43 PM [place] PH: [phone]   FX: [phone] [place]

## 2018-08-17 ENCOUNTER — Other Ambulatory Visit (HOSPITAL_BASED_OUTPATIENT_CLINIC_OR_DEPARTMENT_OTHER): Payer: Self-pay

## 2018-08-17 DIAGNOSIS — G4733 Obstructive sleep apnea (adult) (pediatric): Secondary | ICD-10-CM

## 2018-08-23 ENCOUNTER — Ambulatory Visit: Payer: Medicare Other | Attending: Pulmonary Disease | Admitting: Neurology

## 2018-08-23 DIAGNOSIS — G4761 Periodic limb movement disorder: Secondary | ICD-10-CM | POA: Insufficient documentation

## 2018-08-23 DIAGNOSIS — G4733 Obstructive sleep apnea (adult) (pediatric): Secondary | ICD-10-CM | POA: Diagnosis not present

## 2018-08-26 ENCOUNTER — Ambulatory Visit: Payer: Medicare Other | Admitting: Cardiovascular Disease

## 2018-08-26 ENCOUNTER — Encounter: Payer: Self-pay | Admitting: Cardiovascular Disease

## 2018-08-26 VITALS — BP 130/80 | HR 64 | Ht 70.0 in | Wt 313.0 lb

## 2018-08-26 DIAGNOSIS — G4733 Obstructive sleep apnea (adult) (pediatric): Secondary | ICD-10-CM

## 2018-08-26 DIAGNOSIS — Z9861 Coronary angioplasty status: Secondary | ICD-10-CM

## 2018-08-26 DIAGNOSIS — I2510 Atherosclerotic heart disease of native coronary artery without angina pectoris: Secondary | ICD-10-CM | POA: Diagnosis not present

## 2018-08-26 DIAGNOSIS — I10 Essential (primary) hypertension: Secondary | ICD-10-CM

## 2018-08-26 DIAGNOSIS — E782 Mixed hyperlipidemia: Secondary | ICD-10-CM | POA: Diagnosis not present

## 2018-08-26 LAB — LIPID PANEL
CHOLESTEROL TOTAL: 109 mg/dL (ref 100–199)
Chol/HDL Ratio: 3 ratio (ref 0.0–5.0)
HDL: 36 mg/dL — AB (ref >39)
LDL Calculated: 61 mg/dL (ref 0–99)
Triglycerides: 60 mg/dL (ref 0–149)
VLDL CHOLESTEROL CAL: 12 mg/dL (ref 5–40)

## 2018-08-26 LAB — HEPATIC FUNCTION PANEL
ALT: 23 IU/L (ref 0–44)
AST: 20 IU/L (ref 0–40)
Albumin: 4.4 g/dL (ref 3.6–4.8)
Alkaline Phosphatase: 80 IU/L (ref 39–117)
BILIRUBIN TOTAL: 0.8 mg/dL (ref 0.0–1.2)
BILIRUBIN, DIRECT: 0.25 mg/dL (ref 0.00–0.40)
Total Protein: 6.5 g/dL (ref 6.0–8.5)

## 2018-08-26 NOTE — Assessment & Plan Note (Signed)
History of hyperlipidemia on statin therapy.  We will recheck a lipid liver profile 

## 2018-08-26 NOTE — Assessment & Plan Note (Signed)
History of CAD status post LAD stenting using an Endeavor drug-eluting stent (3 mm x 18 oh meters) by Dr. Rex Kras November 2010.  He had a normal circumflex and RCA at that time.  His EF by recent 2D echo was normal.  He denies chest pain or shortness of breath.

## 2018-08-26 NOTE — Progress Notes (Signed)
08/26/2018 Antonio Colon   1951/11/02  [id number]  Primary Physician Sinda Du, MD Primary Cardiologist: Lorretta Harp MD [name], [place]  HPI:  Antonio Colon is a 67 y.o.  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 Currently he works in Production assistant, radio [place].. I last saw him in the office 08/17/2017. He has a history of CAD status post LAD stenting using an Endeavor drug eluting stent (3 x 18) by Dr. Aldona Bar in November 2010. He had normal circumflex and RCA at that time. His other problems include: Remote tobacco abuse, treated hypertension, and dyslipidemia. He saw Dr. Mali Hilty in the office on January 21, 2012, complaining of chest pain, and a Myoview stress test performed a week later showed no ischemia. He does have a history of GERD. He has had no recurrent symptoms.Dr. Luan Pulling follows his lipid profile closely. Since I saw me year ago he has gained approximately 10-15 pounds. He has joined Chief of Staff is currently riding his bicycle for 30 minutes a day and works out on a treadmill for another 30 minutes a day. He was admitted to [facility] in late May of this year with community-acquired pneumonia.  There was a question of heart failure as well.  2D echo was entirely normal except for a grade 1 diastolic dysfunction.  He does have obstructive sleep apnea on CPAP.  He denies chest pain or shortness of breath.  Current Meds  Medication Sig  . ALPRAZolam (XANAX) 0.25 MG tablet Take 1 tablet by mouth at bedtime. Take 1 tab daily  . aspirin EC 81 MG tablet Take 1 tablet (81 mg total) by mouth daily.  Marland Kitchen BYSTOLIC 5 MG tablet TAKE 1 TABLET BY MOUTH ONCE A DAY.  Marland Kitchen losartan (COZAAR) 100 MG tablet 50 mg.   . mirabegron ER (MYRBETRIQ) 25 MG TB24 Take 25 mg by mouth daily.  . nitroGLYCERIN (NITROSTAT) 0.4 MG SL tablet Place 1 tablet  (0.4 mg total) under the tongue every 5 (five) minutes as needed. For chest pain  . rosuvastatin (CRESTOR) 5 MG tablet TAKE 1 TABLET BY MOUTH AT BEDTIME FOR CHOLESTEROL.     No Known Allergies  Social History   Socioeconomic History  . Marital status: Married    Spouse name: Not on file  . Number of children: Not on file  . Years of education: Not on file  . Highest education level: Not on file  Occupational History  . Not on file  Social Needs  . Financial resource strain: Not on file  . Food insecurity:    Worry: Not on file    Inability: Not on file  . Transportation needs:    Medical: Not on file    Non-medical: Not on file  Tobacco Use  . Smoking status: Former Smoker    Types: Cigarettes, Cigars    Last attempt to quit: 01/25/2009    Years since quitting: 9.5  . Smokeless tobacco: Never Used  Substance and Sexual Activity  . Alcohol use: No  . Drug use: No  . Sexual activity: Not on file  Lifestyle  . Physical activity:    Days per week: Not on file    Minutes per session: Not on file  . Stress: Not on file  Relationships  . Social connections:    Talks on phone: Not on file  Gets together: Not on file    Attends religious service: Not on file    Active member of club or organization: Not on file    Attends meetings of clubs or organizations: Not on file    Relationship status: Not on file  . Intimate partner violence:    Fear of current or ex partner: Not on file    Emotionally abused: Not on file    Physically abused: Not on file    Forced sexual activity: Not on file  Other Topics Concern  . Not on file  Social History Narrative  . Not on file     Review of Systems: General: negative for chills, fever, night sweats or weight changes.  Cardiovascular: negative for chest pain, dyspnea on exertion, edema, orthopnea, palpitations, paroxysmal nocturnal dyspnea or shortness of breath Dermatological: negative for rash Respiratory: negative for cough or  wheezing Urologic: negative for hematuria Abdominal: negative for nausea, vomiting, diarrhea, bright red blood per rectum, melena, or hematemesis Neurologic: negative for visual changes, syncope, or dizziness All other systems reviewed and are otherwise negative except as noted above.    Blood pressure (!) 140/100, pulse 64, height 5\' 10"  (1.778 m), weight (!) 313 lb (142 kg), SpO2 97 %.  General appearance: alert and no distress Neck: no adenopathy, no carotid bruit, no JVD, supple, symmetrical, trachea midline and thyroid not enlarged, symmetric, no tenderness/mass/nodules Lungs: clear to auscultation bilaterally Heart: regular rate and rhythm, S1, S2 normal, no murmur, click, rub or gallop Extremities: extremities normal, atraumatic, no cyanosis or edema Pulses: 2+ and symmetric Skin: Skin color, texture, turgor normal. No rashes or lesions Neurologic: Alert and oriented X 3, normal strength and tone. Normal symmetric reflexes. Normal coordination and gait  EKG not performed today  ASSESSMENT AND PLAN:   CAD S/P percutaneous coronary angioplasty History of CAD status post LAD stenting using an Endeavor drug-eluting stent (3 mm x 18 oh meters) by Dr. Rex Kras November 2010.  He had a normal circumflex and RCA at that time.  His EF by recent 2D echo was normal.  He denies chest pain or shortness of breath.  Hyperlipemia History of hyperlipidemia on statin therapy.  We will recheck a lipid liver profile  HTN (hypertension) History of essential hypertension her blood pressure measured at 140/100.  Follow-up blood pressure at the end of the office visit was 130/80.  He is on Bystolic and losartan.  Obstructive sleep apnea History of obstructive sleep apnea on CPAP.      Lorretta Harp MD FACP,FACC,FAHA, [facility] 08/26/2018 8:12 AM

## 2018-08-26 NOTE — Assessment & Plan Note (Addendum)
History of essential hypertension her blood pressure measured at 140/100.  Follow-up blood pressure at the end of the office visit was 130/80.  He is on Bystolic and losartan.

## 2018-08-26 NOTE — Assessment & Plan Note (Signed)
History of obstructive sleep apnea on CPAP. 

## 2018-08-26 NOTE — Patient Instructions (Signed)
Medication Instructions:  Your physician recommends that you continue on your current medications as directed. Please refer to the Current Medication list given to you today.   Labwork: Your physician recommends that you return for lab work in: [place].   Testing/Procedures: none  Follow-Up: Your physician wants you to follow-up in: 12 months with Dr. Gwenlyn Found. You will receive a reminder letter in the mail two months in advance. If you don't receive a letter, please call our office to schedule the follow-up appointment.   Any Other Special Instructions Will Be Listed Below (If Applicable).     If you need a refill on your cardiac medications before your next appointment, please call your pharmacy.

## 2018-08-30 ENCOUNTER — Encounter: Payer: Self-pay | Admitting: *Deleted

## 2018-08-30 DIAGNOSIS — Z23 Encounter for immunization: Secondary | ICD-10-CM | POA: Diagnosis not present

## 2018-08-30 DIAGNOSIS — I10 Essential (primary) hypertension: Secondary | ICD-10-CM | POA: Diagnosis not present

## 2018-08-30 DIAGNOSIS — I2510 Atherosclerotic heart disease of native coronary artery without angina pectoris: Secondary | ICD-10-CM | POA: Diagnosis not present

## 2018-08-30 DIAGNOSIS — G4733 Obstructive sleep apnea (adult) (pediatric): Secondary | ICD-10-CM | POA: Diagnosis not present

## 2018-08-30 NOTE — Procedures (Signed)
[place] A. Merlene Laughter, MD     [email or web address]             NOCTURNAL POLYSOMNOGRAPHY   LOCATION: [facility]   Patient Name: Antonio, Colon Date: 08/23/2018 Gender: Male D.O.B: 1951/11/20 Age (years): 67 Referring Provider: Sinda Du Height (inches): 69 Interpreting Physician: Phillips Odor MD, ABSM Weight (lbs): 324 RPSGT: Rosebud Poles BMI: 48 MRN: [id number] Neck Size: 20.00 CLINICAL INFORMATION The patient is referred for a CPAP titration to treat sleep apnea.  Date of NPSG, Split Night or HST:  SLEEP STUDY TECHNIQUE As per the AASM Manual for the Scoring of Sleep and Associated Events v2.3 (April 2016) with a hypopnea requiring 4% desaturations.  The channels recorded and monitored were frontal, central and occipital EEG, electrooculogram (EOG), submentalis EMG (chin), nasal and oral airflow, thoracic and abdominal wall motion, anterior tibialis EMG, snore microphone, electrocardiogram, and pulse oximetry. Continuous positive airway pressure (CPAP) was initiated at the beginning of the study and titrated to treat sleep-disordered breathing.  MEDICATIONS Medications self-administered by patient taken the night of the study : N/A  Current Outpatient Medications:  .  ALPRAZolam (XANAX) 0.25 MG tablet, Take 1 tablet by mouth at bedtime. Take 1 tab daily, Disp: , Rfl:  .  aspirin EC 81 MG tablet, Take 1 tablet (81 mg total) by mouth daily., Disp: , Rfl:  .  BYSTOLIC 5 MG tablet, TAKE 1 TABLET BY MOUTH ONCE A DAY., Disp: 30 tablet, Rfl: 11 .  losartan (COZAAR) 100 MG tablet, 50 mg. , Disp: , Rfl:  .  mirabegron ER (MYRBETRIQ) 25 MG TB24, Take 25 mg by mouth daily., Disp: , Rfl:  .  nitroGLYCERIN (NITROSTAT) 0.4 MG SL tablet, Place 1 tablet (0.4 mg total) under the tongue every 5 (five) minutes as needed. For chest pain, Disp: 25 tablet, Rfl: 11 .  rosuvastatin (CRESTOR) 5 MG tablet, TAKE 1 TABLET BY MOUTH AT BEDTIME FOR CHOLESTEROL.,  Disp: 30 tablet, Rfl: 3  TECHNICIAN COMMENTS Comments added by technician: Patient tolerated CPAP very well. CPAP therapy started at 4 cm of H20 and increased to 12 cm of H20 due to events noticed in REM stages. PLMS noticed during the enitre study, associated with and without arousals. Patient awakened at 4:20 am with the anticipation of getting up, which was his normal awake time at home. Suboptimal pressure obtained due to REM-supine stage was not observed during therapy  Comments added by scorer: N/A RESPIRATORY PARAMETERS Optimal PAP Pressure (cm): 12 AHI at Optimal Pressure (/hr): 1.2 Overall Minimal O2 (%): 88.0 Supine % at Optimal Pressure (%): 0 Minimal O2 at Optimal Pressure (%): 91.0   SLEEP ARCHITECTURE The study was initiated at 10:13:03 PM and ended at 4:32:10 AM.  Sleep onset time was 10.3 minutes and the sleep efficiency was 87.8%%. The total sleep time was 333 minutes.  The patient spent 6.2%% of the night in stage N1 sleep, 40.2%% in stage N2 sleep, 29.1%% in stage N3 and 24.5% in REM.Stage REM latency was 78.5 minutes  Wake after sleep onset was 35.8. Alpha intrusion was absent. Supine sleep was 0.00%.  CARDIAC DATA The 2 lead EKG demonstrated sinus rhythm. The mean heart rate was 62.4 beats per minute. Other EKG findings include: PVCs.  LEG MOVEMENT DATA Tthe automated leg movement calculations are nonfunctional.  However, visual inspection reveals that the severity of  periodic limb movement is severe.  IMPRESSIONS The optimal CPAP is 12 cm of water.   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  Antonio Metz, MD Diplomate, American Board of Sleep Medicine. ELECTRONICALLY SIGNED ON:  08/30/2018, 6:46 PM [place] PH: [phone]   FX: [phone] [place]

## 2018-09-08 DIAGNOSIS — G4733 Obstructive sleep apnea (adult) (pediatric): Secondary | ICD-10-CM | POA: Diagnosis not present

## 2018-10-08 DIAGNOSIS — G4733 Obstructive sleep apnea (adult) (pediatric): Secondary | ICD-10-CM | POA: Diagnosis not present

## 2018-10-18 ENCOUNTER — Other Ambulatory Visit: Payer: Self-pay | Admitting: Cardiovascular Disease

## 2018-10-18 NOTE — Telephone Encounter (Signed)
Rx request sent to pharmacy.  

## 2018-10-26 DIAGNOSIS — G4733 Obstructive sleep apnea (adult) (pediatric): Secondary | ICD-10-CM | POA: Diagnosis not present

## 2018-11-08 DIAGNOSIS — G4733 Obstructive sleep apnea (adult) (pediatric): Secondary | ICD-10-CM | POA: Diagnosis not present

## 2018-11-12 ENCOUNTER — Other Ambulatory Visit: Payer: Self-pay | Admitting: Cardiovascular Disease

## 2018-11-30 DIAGNOSIS — E7800 Pure hypercholesterolemia, unspecified: Secondary | ICD-10-CM | POA: Diagnosis not present

## 2018-11-30 DIAGNOSIS — Z0000 Encounter for general adult medical examination without abnormal findings: Secondary | ICD-10-CM | POA: Diagnosis not present

## 2018-11-30 DIAGNOSIS — K589 Irritable bowel syndrome without diarrhea: Secondary | ICD-10-CM | POA: Diagnosis not present

## 2018-11-30 DIAGNOSIS — I2510 Atherosclerotic heart disease of native coronary artery without angina pectoris: Secondary | ICD-10-CM | POA: Diagnosis not present

## 2018-11-30 DIAGNOSIS — I119 Hypertensive heart disease without heart failure: Secondary | ICD-10-CM | POA: Diagnosis not present

## 2018-12-06 DIAGNOSIS — Z1211 Encounter for screening for malignant neoplasm of colon: Secondary | ICD-10-CM | POA: Diagnosis not present

## 2018-12-06 DIAGNOSIS — Z1212 Encounter for screening for malignant neoplasm of rectum: Secondary | ICD-10-CM | POA: Diagnosis not present

## 2018-12-08 DIAGNOSIS — G4733 Obstructive sleep apnea (adult) (pediatric): Secondary | ICD-10-CM | POA: Diagnosis not present

## 2018-12-13 LAB — COLOGUARD

## 2018-12-16 ENCOUNTER — Other Ambulatory Visit: Payer: Self-pay | Admitting: Cardiovascular Disease

## 2018-12-22 ENCOUNTER — Telehealth (INDEPENDENT_AMBULATORY_CARE_PROVIDER_SITE_OTHER): Payer: Self-pay | Admitting: *Deleted

## 2018-12-22 ENCOUNTER — Encounter (INDEPENDENT_AMBULATORY_CARE_PROVIDER_SITE_OTHER): Payer: Self-pay | Admitting: Internal Medicine

## 2018-12-22 ENCOUNTER — Encounter (INDEPENDENT_AMBULATORY_CARE_PROVIDER_SITE_OTHER): Payer: Self-pay | Admitting: *Deleted

## 2018-12-22 ENCOUNTER — Ambulatory Visit (INDEPENDENT_AMBULATORY_CARE_PROVIDER_SITE_OTHER): Payer: Medicare Other | Admitting: Internal Medicine

## 2018-12-22 VITALS — BP 144/68 | HR 64 | Temp 98.1°F | Ht 70.0 in | Wt 326.7 lb

## 2018-12-22 DIAGNOSIS — R195 Other fecal abnormalities: Secondary | ICD-10-CM | POA: Diagnosis not present

## 2018-12-22 NOTE — Progress Notes (Signed)
   Subjective:    Patient ID: Antonio Colon, male    DOB: 04-May-1951, 67 y.o.   MRN: [id number]  HPI Referred by Dr.Hawkins for positive cologuard. States he saw some rectal bleeding several weeks before he did the Colguard. Appetite is good. No weight loss. Has a BM daily. States he did have one black stool 6 weeks x 1. 6/19/2013Colonoscopy Indications:Patient is 67 year old Caucasian male was undergoing average risk screening colonoscopy.  Impression:  Normal terminal ileum and normal colonoscopy.    11/30/2018 H and H 16.4 and 47.3  Review of Systems Past Medical History:  Diagnosis Date  . CAD (coronary artery disease)    stents  . GERD (gastroesophageal reflux disease)   . Heart disease   . Hiatal hernia   . History of stress test 01/27/2012   Normal study. No significant ischemia demonstration, this low risk scan, no significant change compared to previous study.  . Hypercholesteremia   . Hyperlipidemia   . Hypertension   . Obesity     Past Surgical History:  Procedure Laterality Date  . CARDIAC CATHETERIZATION  10/2009   CAD stents to the LAD using an Endeavor drug eluting (3x77mm) by Dr Rex Kras, he had normal circumflex and RCA at the time.  Marland Kitchen CAROTID STENT     x 6 yrs this month  . COLONOSCOPY  06/15/2012  . ESOPHAGOGASTRODUODENOSCOPY N/A 11/15/2015   Procedure: ESOPHAGOGASTRODUODENOSCOPY (EGD);  Surgeon: Rogene Houston, MD;  Location: [facility];  Service: Endoscopy;  Laterality: N/A;  1:10 - moved to 12:55 - Ann notified pt  . TONSILLECTOMY      No Known Allergies  Current Outpatient Medications on File Prior to Visit  Medication Sig Dispense Refill  . ALPRAZolam (XANAX) 0.25 MG tablet Take 1 tablet by mouth at bedtime. Take 1 tab daily    . aspirin EC 81 MG tablet Take 1 tablet (81 mg total) by mouth daily.    Marland Kitchen BYSTOLIC 5 MG tablet TAKE 1 TABLET BY MOUTH ONCE A DAY. 30 tablet 3  . losartan (COZAAR) 100 MG tablet 50 mg.     . mirabegron ER  (MYRBETRIQ) 25 MG TB24 Take 25 mg by mouth daily.    . nitroGLYCERIN (NITROSTAT) 0.4 MG SL tablet Place 1 tablet (0.4 mg total) under the tongue every 5 (five) minutes as needed. For chest pain 25 tablet 11  . rosuvastatin (CRESTOR) 5 MG tablet TAKE 1 TABLET BY MOUTH AT BEDTIME FOR CHOLESTEROL. 30 tablet 0   No current facility-administered medications on file prior to visit.         Objective:   Physical Exam Blood pressure (!) 144/68, pulse 64, temperature 98.1 F (36.7 C), height 5\' 10"  (1.778 m), weight (!) 326 lb 11.2 oz (148.2 kg). Alert and oriented. Skin warm and dry. Oral mucosa is moist.   . Sclera anicteric, conjunctivae is pink. Thyroid not enlarged. No cervical lymphadenopathy. Lungs clear. Heart regular rate and rhythm.  Abdomen is soft. Bowel sounds are positive. No hepatomegaly. No abdominal masses felt. No tenderness.  No edema to lower extremities.          Assessment & Plan:  + cologuard. Colonic neoplasm needs to be ruled out. Polyp in the differential.

## 2018-12-22 NOTE — Telephone Encounter (Signed)
Patient needs trilyte 

## 2018-12-23 DIAGNOSIS — R195 Other fecal abnormalities: Secondary | ICD-10-CM | POA: Insufficient documentation

## 2018-12-23 MED ORDER — PEG 3350-KCL-NA BICARB-NACL 420 G PO SOLR: 4000.0000 mL | Freq: Once | ORAL | 0 refills | Status: AC

## 2019-01-08 DIAGNOSIS — G4733 Obstructive sleep apnea (adult) (pediatric): Secondary | ICD-10-CM | POA: Diagnosis not present

## 2019-01-09 ENCOUNTER — Other Ambulatory Visit: Payer: Self-pay

## 2019-01-09 ENCOUNTER — Encounter (HOSPITAL_COMMUNITY): Payer: Self-pay | Admitting: *Deleted

## 2019-01-09 ENCOUNTER — Ambulatory Visit (HOSPITAL_COMMUNITY)
Admission: RE | Admit: 2019-01-09 | Discharge: 2019-01-09 | Disposition: A | Discharge status: Home / Self Care | Payer: Medicare Other | Attending: Internal Medicine | Admitting: Internal Medicine

## 2019-01-09 ENCOUNTER — Encounter (HOSPITAL_COMMUNITY)
Admission: RE | Disposition: A | Discharge status: Home / Self Care | Payer: Self-pay | Source: Home / Self Care | Attending: Internal Medicine

## 2019-01-09 DIAGNOSIS — R195 Other fecal abnormalities: Secondary | ICD-10-CM | POA: Insufficient documentation

## 2019-01-09 DIAGNOSIS — K5730 Diverticulosis of large intestine without perforation or abscess without bleeding: Secondary | ICD-10-CM | POA: Diagnosis not present

## 2019-01-09 DIAGNOSIS — D123 Benign neoplasm of transverse colon: Secondary | ICD-10-CM | POA: Insufficient documentation

## 2019-01-09 DIAGNOSIS — Z7982 Long term (current) use of aspirin: Secondary | ICD-10-CM | POA: Insufficient documentation

## 2019-01-09 DIAGNOSIS — E785 Hyperlipidemia, unspecified: Secondary | ICD-10-CM | POA: Insufficient documentation

## 2019-01-09 DIAGNOSIS — E7800 Pure hypercholesterolemia, unspecified: Secondary | ICD-10-CM | POA: Diagnosis not present

## 2019-01-09 DIAGNOSIS — Z955 Presence of coronary angioplasty implant and graft: Secondary | ICD-10-CM | POA: Insufficient documentation

## 2019-01-09 DIAGNOSIS — I2510 Atherosclerotic heart disease of native coronary artery without angina pectoris: Secondary | ICD-10-CM | POA: Diagnosis not present

## 2019-01-09 DIAGNOSIS — E669 Obesity, unspecified: Secondary | ICD-10-CM | POA: Insufficient documentation

## 2019-01-09 DIAGNOSIS — K648 Other hemorrhoids: Secondary | ICD-10-CM | POA: Insufficient documentation

## 2019-01-09 DIAGNOSIS — Z87891 Personal history of nicotine dependence: Secondary | ICD-10-CM | POA: Insufficient documentation

## 2019-01-09 DIAGNOSIS — Z8249 Family history of ischemic heart disease and other diseases of the circulatory system: Secondary | ICD-10-CM | POA: Insufficient documentation

## 2019-01-09 DIAGNOSIS — Z79899 Other long term (current) drug therapy: Secondary | ICD-10-CM | POA: Insufficient documentation

## 2019-01-09 DIAGNOSIS — I10 Essential (primary) hypertension: Secondary | ICD-10-CM | POA: Insufficient documentation

## 2019-01-09 HISTORY — PX: POLYPECTOMY: SHX5525

## 2019-01-09 HISTORY — PX: COLONOSCOPY: SHX5424

## 2019-01-09 SURGERY — COLONOSCOPY
Anesthesia: Moderate Sedation

## 2019-01-09 MED ORDER — MEPERIDINE HCL 50 MG/ML IJ SOLN
INTRAMUSCULAR | Status: DC | PRN
  Administered 2019-01-09 (×2): 25 mg via INTRAVENOUS

## 2019-01-09 MED ORDER — MEPERIDINE HCL 50 MG/ML IJ SOLN
INTRAMUSCULAR | Status: AC
  Filled 2019-01-09: qty 1

## 2019-01-09 MED ORDER — MIDAZOLAM HCL 5 MG/5ML IJ SOLN
INTRAMUSCULAR | Status: AC
  Filled 2019-01-09: qty 10

## 2019-01-09 MED ORDER — STERILE WATER FOR IRRIGATION IR SOLN
Status: DC | PRN
  Administered 2019-01-09: 08:00:00

## 2019-01-09 MED ORDER — SODIUM CHLORIDE 0.9 % IV SOLN
INTRAVENOUS | Status: DC
  Administered 2019-01-09: 08:00:00 via INTRAVENOUS

## 2019-01-09 MED ORDER — MIDAZOLAM HCL 5 MG/5ML IJ SOLN
INTRAMUSCULAR | Status: DC | PRN
  Administered 2019-01-09 (×2): 2 mg via INTRAVENOUS
  Administered 2019-01-09: 1 mg via INTRAVENOUS

## 2019-01-09 NOTE — Discharge Instructions (Addendum)
Diverticulosis  Diverticulosis is a condition that develops when small pouches (diverticula) form in the wall of the large intestine (colon). The colon is where water is absorbed and stool is formed. The pouches form when the inside layer of the colon pushes through weak spots in the outer layers of the colon. You may have a few pouches or many of them. What are the causes? The cause of this condition is not known. What increases the risk? The following factors may make you more likely to develop this condition:  Being older than age 85. Your risk for this condition increases with age. Diverticulosis is rare among people younger than age 3. By age 69, many people have it.  Eating a low-fiber diet.  Having frequent constipation.  Being overweight.  Not getting enough exercise.  Smoking.  Taking over-the-counter pain medicines, like aspirin and ibuprofen.  Having a family history of diverticulosis. What are the signs or symptoms? In most people, there are no symptoms of this condition. If you do have symptoms, they may include:  Bloating.  Cramps in the abdomen.  Constipation or diarrhea.  Pain in the lower left side of the abdomen. How is this diagnosed? This condition is most often diagnosed during an exam for other colon problems. Because diverticulosis usually has no symptoms, it often cannot be diagnosed independently. This condition may be diagnosed by:  Using a flexible scope to examine the colon (colonoscopy).  Taking an X-ray of the colon after dye has been put into the colon (barium enema).  Doing a CT scan. How is this treated? You may not need treatment for this condition if you have never developed an infection related to diverticulosis. If you have had an infection before, treatment may include:  Eating a high-fiber diet. This may include eating more fruits, vegetables, and grains.  Taking a fiber supplement.  Taking a live bacteria supplement  (probiotic).  Taking medicine to relax your colon.  Taking antibiotic medicines. Follow these instructions at home:  Drink 6-8 glasses of water or more each day to prevent constipation.  Try not to strain when you have a bowel movement.  If you have had an infection before: ? Eat more fiber as directed by your health care provider or your diet and nutrition specialist (dietitian). ? Take a fiber supplement or probiotic, if your health care provider approves.  Take over-the-counter and prescription medicines only as told by your health care provider.  If you were prescribed an antibiotic, take it as told by your health care provider. Do not stop taking the antibiotic even if you start to feel better.  Keep all follow-up visits as told by your health care provider. This is important. Contact a health care provider if:  You have pain in your abdomen.  You have bloating.  You have cramps.  You have not had a bowel movement in 3 days. Get help right away if:  Your pain gets worse.  Your bloating becomes very bad.  You have a fever or chills, and your symptoms suddenly get worse.  You vomit.  You have bowel movements that are bloody or black.  You have bleeding from your rectum. Summary  Diverticulosis is a condition that develops when small pouches (diverticula) form in the wall of the large intestine (colon).  You may have a few pouches or many of them.  This condition is most often diagnosed during an exam for other colon problems.  If you have had an infection related to  diverticulosis, treatment may include increasing the fiber in your diet, taking supplements, or taking medicines. This information is not intended to replace advice given to you by your health care provider. Make sure you discuss any questions you have with your health care provider. Document Released: 09/10/2004 Document Revised: 11/02/2016 Document Reviewed: 11/02/2016 Elsevier Interactive Patient  Education  2019 [employer]. Colon Polyps  Polyps are tissue growths inside the body. Polyps can grow in many places, including the large intestine (colon). A polyp may be a round bump or a mushroom-shaped growth. You could have one polyp or several. Most colon polyps are noncancerous (benign). However, some colon polyps can become cancerous over time. Finding and removing the polyps early can help prevent this. What are the causes? The exact cause of colon polyps is not known. What increases the risk? You are more likely to develop this condition if you:  Have a family history of colon cancer or colon polyps.  Are older than 55 or older than 45 if you are African American.  Have inflammatory bowel disease, such as ulcerative colitis or Crohn's disease.  Have certain hereditary conditions, such as: ? Familial adenomatous polyposis. ? Lynch syndrome. ? Turcot syndrome. ? Peutz-Jeghers syndrome.  Are overweight.  Smoke cigarettes.  Do not get enough exercise.  Drink too much alcohol.  Eat a diet that is high in fat and red meat and low in fiber.  Had childhood cancer that was treated with abdominal radiation. What are the signs or symptoms? Most polyps do not cause symptoms. If you have symptoms, they may include:  Blood coming from your rectum when having a bowel movement.  Blood in your stool. The stool may look dark red or black.  Abdominal pain.  A change in bowel habits, such as constipation or diarrhea. How is this diagnosed? This condition is diagnosed with a colonoscopy. This is a procedure in which a lighted, flexible scope is inserted into the anus and then passed into the colon to examine the area. Polyps are sometimes found when a colonoscopy is done as part of routine cancer screening tests. How is this treated? Treatment for this condition involves removing any polyps that are found. Most polyps can be removed during a colonoscopy. Those polyps will then  be tested for cancer. Additional treatment may be needed depending on the results of testing. Follow these instructions at home: Lifestyle  Maintain a healthy weight, or lose weight if recommended by your health care provider.  Exercise every day or as told by your health care provider.  Do not use any products that contain nicotine or tobacco, such as cigarettes and e-cigarettes. If you need help quitting, ask your health care provider.  If you drink alcohol, limit how much you have: ? 0-1 drink a day for women. ? 0-2 drinks a day for men.  Be aware of how much alcohol is in your drink. In the [place] one drink equals one 12 oz bottle of beer (355 mL), one 5 oz glass of wine (148 mL), or one 1 oz shot of hard liquor (44 mL). Eating and drinking   Eat foods that are high in fiber, such as fruits, vegetables, and whole grains.  Eat foods that are high in calcium and vitamin D, such as milk, cheese, yogurt, eggs, liver, fish, and broccoli.  Limit foods that are high in fat, such as fried foods and desserts.  Limit the amount of red meat and processed meat you eat, such as hot  dogs, sausage, bacon, and lunch meats. General instructions  Keep all follow-up visits as told by your health care provider. This is important. ? This includes having regularly scheduled colonoscopies. ? Talk to your health care provider about when you need a colonoscopy. Contact a health care provider if:  You have new or worsening bleeding during a bowel movement.  You have new or increased blood in your stool.  You have a change in bowel habits.  You lose weight for no known reason. Summary  Polyps are tissue growths inside the body. Polyps can grow in many places, including the colon.  Most colon polyps are noncancerous (benign), but some can become cancerous over time.  This condition is diagnosed with a colonoscopy.  Treatment for this condition involves removing any polyps that are found. Most  polyps can be removed during a colonoscopy. This information is not intended to replace advice given to you by your health care provider. Make sure you discuss any questions you have with your health care provider. Document Released: 09/09/2004 Document Revised: 03/31/2018 Document Reviewed: 03/31/2018 Elsevier Interactive Patient Education  2019 [employer]. Resume aspirin on 01/10/2019. Resume other medications as before. High-fiber diet. No driving for 24 hours. Physician will call with the biopsy results.

## 2019-01-09 NOTE — H&P (Signed)
Antonio Colon is an 68 y.o. male.   Chief Complaint: Patient is here for colonoscopy. HPI: Patient 68 year old Caucasian male who had Cologuard testing as a screening tool for CRC.  It was reported to be positive.  He denies melena or rectal bleeding or change in his bowel habits.  His last colonoscopy in June 2013 was normal. He is on low-dose aspirin which is on hold. Family history is negative for CRC.  Past Medical History:  Diagnosis Date  . CAD (coronary artery disease)    stents  . GERD (gastroesophageal reflux disease)   . Heart disease   . Hiatal hernia   . History of stress test 01/27/2012   Normal study. No significant ischemia demonstration, this low risk scan, no significant change compared to previous study.  . Hypercholesteremia   . Hyperlipidemia   . Hypertension   . Obesity     Past Surgical History:  Procedure Laterality Date  . CARDIAC CATHETERIZATION  10/2009   CAD stents to the LAD using an Endeavor drug eluting (3x40mm) by Dr Rex Kras, he had normal circumflex and RCA at the time.  Marland Kitchen CAROTID STENT     x 6 yrs this month  . COLONOSCOPY  06/15/2012  . ESOPHAGOGASTRODUODENOSCOPY N/A 11/15/2015   Procedure: ESOPHAGOGASTRODUODENOSCOPY (EGD);  Surgeon: Rogene Houston, MD;  Location: [facility];  Service: Endoscopy;  Laterality: N/A;  1:10 - moved to 12:55 - Ann notified pt  . TONSILLECTOMY      Family History  Problem Relation Age of Onset  . Heart disease Mother   . Healthy Sister    Social History:  reports that he quit smoking about 9 years ago. His smoking use included cigarettes and cigars. He has never used smokeless tobacco. He reports that he does not drink alcohol or use drugs.  Allergies: No Known Allergies  Medications Prior to Admission  Medication Sig Dispense Refill  . ALPRAZolam (XANAX) 0.25 MG tablet Take 0.25 mg by mouth at bedtime.     Marland Kitchen aspirin EC 81 MG tablet Take 1 tablet (81 mg total) by mouth daily.    Marland Kitchen BYSTOLIC 5 MG tablet TAKE 1  TABLET BY MOUTH ONCE A DAY. (Patient taking differently: Take 5 mg by mouth daily. ) 30 tablet 3  . losartan (COZAAR) 50 MG tablet Take 50 mg by mouth daily.     . mirabegron ER (MYRBETRIQ) 25 MG TB24 Take 25 mg by mouth daily.    . rosuvastatin (CRESTOR) 5 MG tablet TAKE 1 TABLET BY MOUTH AT BEDTIME FOR CHOLESTEROL. (Patient taking differently: Take 5 mg by mouth at bedtime. ) 30 tablet 0  . nitroGLYCERIN (NITROSTAT) 0.4 MG SL tablet Place 1 tablet (0.4 mg total) under the tongue every 5 (five) minutes as needed. For chest pain 25 tablet 11    No results found for this or any previous visit (from the past 48 hour(s)). No results found.  ROS  Blood pressure 135/61, pulse 60, temperature (!) 97.5 F (36.4 C), temperature source Oral, resp. rate 12, SpO2 97 %. Physical Exam  Constitutional: He appears well-developed and well-nourished.  HENT:  Mouth/Throat: Oropharynx is clear and moist.  Eyes: Conjunctivae are normal. No scleral icterus.  Neck: No thyromegaly present.  Cardiovascular: Normal rate, regular rhythm and normal heart sounds.  No murmur heard. Respiratory: Effort normal and breath sounds normal.  GI:  Abdomen is obese.  Soft and nontender with organomegaly or masses.  Musculoskeletal:  General: No edema.  Lymphadenopathy:    He has no cervical adenopathy.  Neurological: He is alert.  Skin: Skin is warm and dry.     Assessment/Plan Positive Cologuard test. Diagnostic colonoscopy.  Hildred Laser, MD 01/09/2019, 8:20 AM

## 2019-01-09 NOTE — Op Note (Signed)
[facility] Patient Name: Antonio Colon Procedure Date: 01/09/2019 8:15 AM MRN: [id number] Date of Birth: 12/31/1950 Attending MD: Hildred Laser , MD CSN: [id number] Age: 68 Admit Type: Outpatient Procedure:                Colonoscopy Indications:              Positive Cologuard test Providers:                Hildred Laser, MD, Jeanann Lewandowsky. Sharon Seller, RN, Nelma Rothman, Technician Referring MD:             Jasper Loser. Luan Pulling, MD Medicines:                Meperidine 50 mg IV, Midazolam 5 mg IV Complications:            No immediate complications. Estimated Blood Loss:     Estimated blood loss was minimal. Procedure:                Pre-Anesthesia Assessment:                           - Prior to the procedure, a History and Physical                            was performed, and patient medications and                            allergies were reviewed. The patient's tolerance of                            previous anesthesia was also reviewed. The risks                            and benefits of the procedure and the sedation                            options and risks were discussed with the patient.                            All questions were answered, and informed consent                            was obtained. Prior Anticoagulants: The patient                            last took aspirin 3 days prior to the procedure.                            ASA Grade Assessment: II - A patient with mild                            systemic disease. After reviewing the risks and  benefits, the patient was deemed in satisfactory                            condition to undergo the procedure.                           After obtaining informed consent, the colonoscope                            was passed under direct vision. Throughout the                            procedure, the patient's blood pressure, pulse, and                            oxygen  saturations were monitored continuously. The                            [id number]-[id number] ([id number]) scope was introduced through                            the anus and advanced to the the cecum, identified                            by appendiceal orifice and ileocecal valve. The                            colonoscopy was performed without difficulty. The                            patient tolerated the procedure well. The quality                            of the bowel preparation was good. The ileocecal                            valve, appendiceal orifice, and rectum were                            photographed. Scope In: 8:31:06 AM Scope Out: 8:57:00 AM Scope Withdrawal Time: 0 hours 15 minutes 44 seconds  Total Procedure Duration: 0 hours 25 minutes 54 seconds  Findings:      The perianal and digital rectal examinations were normal.      A 5 mm polyp was found in the hepatic flexure. The polyp was sessile.       The polyp was removed with a cold snare. Resection and retrieval were       complete. The pathology specimen was placed into Bottle Number 1.      A small polyp was found in the hepatic flexure. The polyp was sessile.       Biopsies were taken with a cold forceps for histology. The pathology       specimen was placed into Bottle Number 1.      Scattered medium-mouthed diverticula were found in the sigmoid colon.  Internal hemorrhoids were found during retroflexion. The hemorrhoids       were small. Impression:               - One 5 mm polyp at the hepatic flexure, removed                            with a cold snare. Resected and retrieved.                           - One small polyp at the hepatic flexure. Biopsied.                           - Diverticulosis in the sigmoid colon.                           - Internal hemorrhoids. Moderate Sedation:      Moderate (conscious) sedation was administered by the endoscopy nurse       and supervised by the endoscopist. The  following parameters were       monitored: oxygen saturation, heart rate, blood pressure, CO2       capnography and response to care. Total physician intraservice time was       32 minutes. Recommendation:           - Patient has a contact number available for                            emergencies. The signs and symptoms of potential                            delayed complications were discussed with the                            patient. Return to normal activities tomorrow.                            Written discharge instructions were provided to the                            patient.                           - High fiber diet today.                           - Continue present medications.                           - No aspirin, ibuprofen, naproxen, or other                            non-steroidal anti-inflammatory drugs for 1 day.                           - Await pathology results.                           -  Repeat colonoscopy is recommended. The                            colonoscopy date will be determined after pathology                            results from today's exam become available for                            review. Procedure Code(s):        --- Professional ---                           [phone], Colonoscopy, flexible; with removal of                            tumor(s), polyp(s), or other lesion(s) by snare                            technique                           45380, 59, Colonoscopy, flexible; with biopsy,                            single or multiple                           99153, Moderate sedation; each additional 15                            minutes intraservice time                           G0500, Moderate sedation services provided by the                            same physician or other qualified health care                            professional performing a gastrointestinal                            endoscopic service that sedation  supports,                            requiring the presence of an independent trained                            observer to assist in the monitoring of the                            patient's level of consciousness and physiological                            status; initial 15 minutes of intra-service time;  patient age 81 years or older (additional time may                            be reported with [id number], as appropriate) Diagnosis Code(s):        --- Professional ---                           D12.3, Benign neoplasm of transverse colon (hepatic                            flexure or splenic flexure)                           K64.8, Other hemorrhoids                           R19.5, Other fecal abnormalities                           K57.30, Diverticulosis of large intestine without                            perforation or abscess without bleeding CPT copyright 2018 [employer]. All rights reserved. The codes documented in this report are preliminary and upon coder review may  be revised to meet current compliance requirements. Hildred Laser, MD Hildred Laser, MD 01/09/2019 9:04:57 AM This report has been signed electronically. Number of Addenda: 0

## 2019-01-11 ENCOUNTER — Encounter (HOSPITAL_COMMUNITY): Payer: Self-pay | Admitting: Internal Medicine

## 2019-01-12 ENCOUNTER — Other Ambulatory Visit: Payer: Self-pay | Admitting: Cardiovascular Disease

## 2019-01-12 NOTE — Telephone Encounter (Signed)
Rx request sent to pharmacy.  

## 2019-02-08 DIAGNOSIS — G4733 Obstructive sleep apnea (adult) (pediatric): Secondary | ICD-10-CM | POA: Diagnosis not present

## 2019-03-09 DIAGNOSIS — G4733 Obstructive sleep apnea (adult) (pediatric): Secondary | ICD-10-CM | POA: Diagnosis not present

## 2019-03-11 DIAGNOSIS — G4733 Obstructive sleep apnea (adult) (pediatric): Secondary | ICD-10-CM | POA: Diagnosis not present

## 2019-04-09 DIAGNOSIS — G4733 Obstructive sleep apnea (adult) (pediatric): Secondary | ICD-10-CM | POA: Diagnosis not present

## 2019-04-15 ENCOUNTER — Other Ambulatory Visit: Payer: Self-pay | Admitting: Cardiovascular Disease

## 2019-04-17 ENCOUNTER — Other Ambulatory Visit: Payer: Self-pay | Admitting: Cardiovascular Disease

## 2019-04-17 NOTE — Telephone Encounter (Signed)
Bystolic 5 mg refilled. 

## 2019-05-09 DIAGNOSIS — G4733 Obstructive sleep apnea (adult) (pediatric): Secondary | ICD-10-CM | POA: Diagnosis not present

## 2019-06-09 DIAGNOSIS — G4733 Obstructive sleep apnea (adult) (pediatric): Secondary | ICD-10-CM | POA: Diagnosis not present

## 2019-06-12 ENCOUNTER — Ambulatory Visit (HOSPITAL_COMMUNITY)
Admission: RE | Admit: 2019-06-12 | Discharge: 2019-06-12 | Disposition: A | Discharge status: Home / Self Care | Payer: Medicare Other | Source: Ambulatory Visit | Attending: Pulmonary Disease | Admitting: Pulmonary Disease

## 2019-06-12 ENCOUNTER — Other Ambulatory Visit: Payer: Self-pay

## 2019-06-12 ENCOUNTER — Other Ambulatory Visit (HOSPITAL_COMMUNITY): Payer: Self-pay | Admitting: Pulmonary Disease

## 2019-06-12 DIAGNOSIS — M25551 Pain in right hip: Secondary | ICD-10-CM

## 2019-06-12 DIAGNOSIS — M5136 Other intervertebral disc degeneration, lumbar region: Secondary | ICD-10-CM | POA: Diagnosis not present

## 2019-06-16 ENCOUNTER — Other Ambulatory Visit: Payer: Self-pay | Admitting: Pulmonary Disease

## 2019-06-16 DIAGNOSIS — M545 Low back pain, unspecified: Secondary | ICD-10-CM

## 2019-06-21 DIAGNOSIS — M545 Low back pain: Secondary | ICD-10-CM | POA: Diagnosis not present

## 2019-06-21 DIAGNOSIS — I2510 Atherosclerotic heart disease of native coronary artery without angina pectoris: Secondary | ICD-10-CM | POA: Diagnosis not present

## 2019-06-21 DIAGNOSIS — E785 Hyperlipidemia, unspecified: Secondary | ICD-10-CM | POA: Diagnosis not present

## 2019-06-22 ENCOUNTER — Ambulatory Visit
Admission: RE | Admit: 2019-06-22 | Discharge: 2019-06-22 | Disposition: A | Discharge status: Home / Self Care | Payer: Medicare Other | Source: Ambulatory Visit | Attending: Pulmonary Disease | Admitting: Pulmonary Disease

## 2019-06-22 ENCOUNTER — Other Ambulatory Visit: Payer: Self-pay

## 2019-06-22 DIAGNOSIS — M545 Low back pain, unspecified: Secondary | ICD-10-CM

## 2019-06-22 DIAGNOSIS — M48061 Spinal stenosis, lumbar region without neurogenic claudication: Secondary | ICD-10-CM | POA: Diagnosis not present

## 2019-06-28 ENCOUNTER — Other Ambulatory Visit: Payer: Self-pay | Admitting: Pulmonary Disease

## 2019-06-28 DIAGNOSIS — M545 Low back pain, unspecified: Secondary | ICD-10-CM

## 2019-06-28 DIAGNOSIS — G8929 Other chronic pain: Secondary | ICD-10-CM

## 2019-07-07 ENCOUNTER — Ambulatory Visit
Admission: RE | Admit: 2019-07-07 | Discharge: 2019-07-07 | Disposition: A | Discharge status: Home / Self Care | Payer: Medicare Other | Source: Ambulatory Visit | Attending: Pulmonary Disease | Admitting: Pulmonary Disease

## 2019-07-07 ENCOUNTER — Other Ambulatory Visit: Payer: Self-pay

## 2019-07-07 DIAGNOSIS — M545 Low back pain, unspecified: Secondary | ICD-10-CM

## 2019-07-07 DIAGNOSIS — G8929 Other chronic pain: Secondary | ICD-10-CM

## 2019-07-07 DIAGNOSIS — M48061 Spinal stenosis, lumbar region without neurogenic claudication: Secondary | ICD-10-CM | POA: Diagnosis not present

## 2019-07-07 MED ORDER — METHYLPREDNISOLONE ACETATE 40 MG/ML INJ SUSP (RADIOLOG
120.0000 mg | Freq: Once | INTRAMUSCULAR | Status: AC
  Administered 2019-07-07: 10:00:00 120 mg via EPIDURAL

## 2019-07-07 MED ORDER — IOPAMIDOL (ISOVUE-M 200) INJECTION 41%
1.0000 mL | Freq: Once | INTRAMUSCULAR | Status: AC
  Administered 2019-07-07: 10:00:00 1 mL via EPIDURAL

## 2019-07-07 NOTE — Discharge Instructions (Signed)

## 2019-07-08 ENCOUNTER — Other Ambulatory Visit: Payer: Medicare Other

## 2019-07-13 ENCOUNTER — Other Ambulatory Visit: Payer: Medicare Other

## 2019-08-08 ENCOUNTER — Other Ambulatory Visit: Payer: Self-pay | Admitting: Cardiovascular Disease

## 2019-08-14 DIAGNOSIS — M545 Low back pain: Secondary | ICD-10-CM | POA: Diagnosis not present

## 2019-08-15 ENCOUNTER — Other Ambulatory Visit: Payer: Self-pay | Admitting: Cardiovascular Disease

## 2019-08-23 ENCOUNTER — Other Ambulatory Visit: Payer: Self-pay | Admitting: Pulmonary Disease

## 2019-08-23 DIAGNOSIS — G8929 Other chronic pain: Secondary | ICD-10-CM

## 2019-08-28 ENCOUNTER — Other Ambulatory Visit: Payer: Self-pay

## 2019-08-28 ENCOUNTER — Ambulatory Visit
Admission: RE | Admit: 2019-08-28 | Discharge: 2019-08-28 | Disposition: A | Discharge status: Home / Self Care | Payer: Medicare Other | Source: Ambulatory Visit | Attending: Pulmonary Disease | Admitting: Pulmonary Disease

## 2019-08-28 DIAGNOSIS — G8929 Other chronic pain: Secondary | ICD-10-CM

## 2019-08-28 DIAGNOSIS — M79604 Pain in right leg: Secondary | ICD-10-CM | POA: Diagnosis not present

## 2019-08-28 MED ORDER — METHYLPREDNISOLONE ACETATE 40 MG/ML INJ SUSP (RADIOLOG
120.0000 mg | Freq: Once | INTRAMUSCULAR | Status: AC
  Administered 2019-08-28: 120 mg via EPIDURAL

## 2019-08-28 MED ORDER — IOPAMIDOL (ISOVUE-M 200) INJECTION 41%
1.0000 mL | Freq: Once | INTRAMUSCULAR | Status: AC
  Administered 2019-08-28: 1 mL via EPIDURAL

## 2019-08-28 NOTE — Discharge Instructions (Signed)

## 2019-09-05 DIAGNOSIS — Z23 Encounter for immunization: Secondary | ICD-10-CM | POA: Diagnosis not present

## 2019-09-05 DIAGNOSIS — I10 Essential (primary) hypertension: Secondary | ICD-10-CM | POA: Diagnosis not present

## 2019-09-05 DIAGNOSIS — I2510 Atherosclerotic heart disease of native coronary artery without angina pectoris: Secondary | ICD-10-CM | POA: Diagnosis not present

## 2019-09-05 DIAGNOSIS — M545 Low back pain: Secondary | ICD-10-CM | POA: Diagnosis not present

## 2019-09-07 ENCOUNTER — Other Ambulatory Visit: Payer: Self-pay | Admitting: Cardiovascular Disease

## 2019-09-19 ENCOUNTER — Other Ambulatory Visit: Payer: Self-pay | Admitting: Pulmonary Disease

## 2019-09-19 DIAGNOSIS — M545 Low back pain, unspecified: Secondary | ICD-10-CM

## 2019-09-22 ENCOUNTER — Other Ambulatory Visit: Payer: Self-pay

## 2019-09-22 ENCOUNTER — Ambulatory Visit
Admission: RE | Admit: 2019-09-22 | Discharge: 2019-09-22 | Disposition: A | Discharge status: Home / Self Care | Payer: Medicare Other | Source: Ambulatory Visit | Attending: Pulmonary Disease | Admitting: Pulmonary Disease

## 2019-09-22 DIAGNOSIS — M545 Low back pain, unspecified: Secondary | ICD-10-CM

## 2019-09-22 DIAGNOSIS — M5126 Other intervertebral disc displacement, lumbar region: Secondary | ICD-10-CM | POA: Diagnosis not present

## 2019-09-22 MED ORDER — METHYLPREDNISOLONE ACETATE 40 MG/ML INJ SUSP (RADIOLOG
120.0000 mg | Freq: Once | INTRAMUSCULAR | Status: AC
  Administered 2019-09-22: 09:00:00 120 mg via EPIDURAL

## 2019-09-22 MED ORDER — IOPAMIDOL (ISOVUE-M 200) INJECTION 41%
1.0000 mL | Freq: Once | INTRAMUSCULAR | Status: AC
  Administered 2019-09-22: 09:00:00 1 mL via EPIDURAL

## 2019-09-22 NOTE — Discharge Instructions (Signed)

## 2019-10-06 ENCOUNTER — Other Ambulatory Visit: Payer: Self-pay

## 2019-10-06 ENCOUNTER — Ambulatory Visit: Payer: Medicare Other | Admitting: Cardiovascular Disease

## 2019-10-06 ENCOUNTER — Encounter: Payer: Self-pay | Admitting: Cardiovascular Disease

## 2019-10-06 DIAGNOSIS — E782 Mixed hyperlipidemia: Secondary | ICD-10-CM

## 2019-10-06 DIAGNOSIS — Z9861 Coronary angioplasty status: Secondary | ICD-10-CM

## 2019-10-06 DIAGNOSIS — I10 Essential (primary) hypertension: Secondary | ICD-10-CM

## 2019-10-06 DIAGNOSIS — I2510 Atherosclerotic heart disease of native coronary artery without angina pectoris: Secondary | ICD-10-CM

## 2019-10-06 DIAGNOSIS — G4733 Obstructive sleep apnea (adult) (pediatric): Secondary | ICD-10-CM | POA: Diagnosis not present

## 2019-10-06 NOTE — Progress Notes (Signed)
10/06/2019 Antonio Colon   March 22, 1951  [id number]  Primary Physician Sinda Du, MD Primary Cardiologist: Lorretta Harp MD [name], [place]  HPI:  Antonio Colon is a 68 y.o.  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 Currently he works in Production assistant, radio [place].. I last saw him in the office  08/26/2018. He has a history of CAD status post LAD stenting using an Endeavor drug eluting stent (3 x 18) by Dr. Aldona Bar in November 2010. He had normal circumflex and RCA at that time. His other problems include: Remote tobacco abuse, treated hypertension, and dyslipidemia. He saw Dr. Mali Hilty in the office on January 21, 2012, complaining of chest pain, and a Myoview stress test performed a week later showed no ischemia. He does have a history of GERD. He has had no recurrent symptoms.Dr. Luan Pulling follows his lipid profile closely.  He was working out at [employer] on the bicycle and treadmill however since he has had some back issues since I last saw him he now does exercise in the pool.   He was admitted to [facility] in late May of this year with community-acquired pneumonia.  There was a question of heart failure as well.  2D echo was entirely normal except for a grade 1 diastolic dysfunction.  He does have obstructive sleep apnea on CPAP.  He denies chest pain or shortness of breath.   Current Meds  Medication Sig  . aspirin EC 81 MG tablet Take 1 tablet (81 mg total) by mouth daily.  Marland Kitchen BYSTOLIC 5 MG tablet TAKE 1 TABLET BY MOUTH DAILY  . losartan (COZAAR) 100 MG tablet   . losartan (COZAAR) 50 MG tablet Take 50 mg by mouth daily.   . mirabegron ER (MYRBETRIQ) 25 MG TB24 Take 25 mg by mouth daily.  Marland Kitchen MYRBETRIQ 50 MG TB24 tablet   . nitroGLYCERIN (NITROSTAT) 0.4 MG SL tablet Place 1 tablet (0.4 mg total) under the tongue every 5  (five) minutes as needed. For chest pain  . rosuvastatin (CRESTOR) 5 MG tablet TAKE 1 TABLET BY MOUTH DAILY AT 6PM FOR CHOLESTEROL.  . [DISCONTINUED] predniSONE (DELTASONE) 10 MG tablet      No Known Allergies  Social History   Socioeconomic History  . Marital status: Married    Spouse name: Not on file  . Number of children: Not on file  . Years of education: Not on file  . Highest education level: Not on file  Occupational History  . Not on file  Social Needs  . Financial resource strain: Not on file  . Food insecurity    Worry: Not on file    Inability: Not on file  . Transportation needs    Medical: Not on file    Non-medical: Not on file  Tobacco Use  . Smoking status: Former Smoker    Types: Cigarettes, Cigars    Quit date: 01/25/2009    Years since quitting: 10.7  . Smokeless tobacco: Never Used  Substance and Sexual Activity  . Alcohol use: No  . Drug use: No  . Sexual activity: Not on file  Lifestyle  . Physical activity    Days per week: Not on file    Minutes per session: Not on file  . Stress: Not on file  Relationships  . Social Herbalist on  phone: Not on file    Gets together: Not on file    Attends religious service: Not on file    Active member of club or organization: Not on file    Attends meetings of clubs or organizations: Not on file    Relationship status: Not on file  . Intimate partner violence    Fear of current or ex partner: Not on file    Emotionally abused: Not on file    Physically abused: Not on file    Forced sexual activity: Not on file  Other Topics Concern  . Not on file  Social History Narrative  . Not on file     Review of Systems: General: negative for chills, fever, night sweats or weight changes.  Cardiovascular: negative for chest pain, dyspnea on exertion, edema, orthopnea, palpitations, paroxysmal nocturnal dyspnea or shortness of breath Dermatological: negative for rash Respiratory: negative for cough  or wheezing Urologic: negative for hematuria Abdominal: negative for nausea, vomiting, diarrhea, bright red blood per rectum, melena, or hematemesis Neurologic: negative for visual changes, syncope, or dizziness All other systems reviewed and are otherwise negative except as noted above.    Blood pressure (!) 144/92, pulse 67, temperature 98.2 F (36.8 C), height 5\' 10"  (1.778 m), weight (!) 311 lb (141.1 kg).  General appearance: alert and no distress Neck: no adenopathy, no carotid bruit, no JVD, supple, symmetrical, trachea midline and thyroid not enlarged, symmetric, no tenderness/mass/nodules Lungs: clear to auscultation bilaterally Heart: regular rate and rhythm, S1, S2 normal, no murmur, click, rub or gallop Extremities: extremities normal, atraumatic, no cyanosis or edema Pulses: 2+ and symmetric Skin: Skin color, texture, turgor normal. No rashes or lesions Neurologic: Alert and oriented X 3, normal strength and tone. Normal symmetric reflexes. Normal coordination and gait  EKG sinus rhythm at 67 without ST or T wave changes.  I personally reviewed this EKG.  ASSESSMENT AND PLAN:   CAD S/P percutaneous coronary angioplasty History of CAD status post LAD stenting using an Endeavor drug-eluting stent (3 mm x 80 mm) by Dr. Aldona Bar November 2010.  He had normal circumflex and RCA at that time.  Myoview performed 01/21/2012 was normal.  He denies recurrent chest pain.  Hyperlipemia History of hyperlipidemia on statin therapy with lipid profile performed 08/26/2018 revealing a total cholesterol 109, LDL 61 and HDL 36.  HTN (hypertension) History of essential hypertension with blood pressure measured today at 144/92, somewhat higher than it usually runs for it for him.  He is on Bystolic, losartan.  Obstructive sleep apnea History of obstructive sleep apnea on CPAP since I saw him last year ago.  He benefits from this.      Lorretta Harp MD FACP,FACC,FAHA, [facility]  10/06/2019 9:21 AM

## 2019-10-06 NOTE — Patient Instructions (Signed)

## 2019-10-06 NOTE — Assessment & Plan Note (Signed)
History of obstructive sleep apnea on CPAP since I saw him last year ago.  He benefits from this.

## 2019-10-06 NOTE — Assessment & Plan Note (Signed)
History of essential hypertension with blood pressure measured today at 144/92, somewhat higher than it usually runs for it for him.  He is on Bystolic, losartan.

## 2019-10-06 NOTE — Assessment & Plan Note (Signed)
History of CAD status post LAD stenting using an Endeavor drug-eluting stent (3 mm x 80 mm) by Dr. Aldona Bar November 2010.  He had normal circumflex and RCA at that time.  Myoview performed 01/21/2012 was normal.  He denies recurrent chest pain.

## 2019-10-06 NOTE — Assessment & Plan Note (Signed)
History of hyperlipidemia on statin therapy with lipid profile performed 08/26/2018 revealing a total cholesterol 109, LDL 61 and HDL 36.

## 2019-10-07 ENCOUNTER — Other Ambulatory Visit: Payer: Self-pay | Admitting: Cardiovascular Disease

## 2019-10-17 DIAGNOSIS — M5441 Lumbago with sciatica, right side: Secondary | ICD-10-CM | POA: Diagnosis not present

## 2019-10-17 DIAGNOSIS — G8929 Other chronic pain: Secondary | ICD-10-CM | POA: Diagnosis not present

## 2019-10-17 DIAGNOSIS — M5442 Lumbago with sciatica, left side: Secondary | ICD-10-CM | POA: Diagnosis not present

## 2019-10-29 HISTORY — PX: BACK SURGERY: SHX140

## 2019-10-31 DIAGNOSIS — I10 Essential (primary) hypertension: Secondary | ICD-10-CM | POA: Diagnosis not present

## 2019-10-31 DIAGNOSIS — M4726 Other spondylosis with radiculopathy, lumbar region: Secondary | ICD-10-CM | POA: Diagnosis not present

## 2019-10-31 DIAGNOSIS — M4316 Spondylolisthesis, lumbar region: Secondary | ICD-10-CM | POA: Diagnosis not present

## 2019-11-02 DIAGNOSIS — M5126 Other intervertebral disc displacement, lumbar region: Secondary | ICD-10-CM | POA: Diagnosis not present

## 2019-11-02 DIAGNOSIS — M48062 Spinal stenosis, lumbar region with neurogenic claudication: Secondary | ICD-10-CM | POA: Diagnosis not present

## 2019-11-02 DIAGNOSIS — M4726 Other spondylosis with radiculopathy, lumbar region: Secondary | ICD-10-CM | POA: Diagnosis not present

## 2019-11-13 DIAGNOSIS — M4316 Spondylolisthesis, lumbar region: Secondary | ICD-10-CM | POA: Diagnosis not present

## 2019-11-13 DIAGNOSIS — M4726 Other spondylosis with radiculopathy, lumbar region: Secondary | ICD-10-CM | POA: Diagnosis not present

## 2019-12-11 DIAGNOSIS — M545 Low back pain: Secondary | ICD-10-CM | POA: Diagnosis not present

## 2019-12-11 DIAGNOSIS — I10 Essential (primary) hypertension: Secondary | ICD-10-CM | POA: Diagnosis not present

## 2019-12-11 DIAGNOSIS — I2510 Atherosclerotic heart disease of native coronary artery without angina pectoris: Secondary | ICD-10-CM | POA: Diagnosis not present

## 2019-12-13 ENCOUNTER — Other Ambulatory Visit: Payer: Self-pay | Admitting: Cardiovascular Disease

## 2020-01-10 DIAGNOSIS — Z0189 Encounter for other specified special examinations: Secondary | ICD-10-CM | POA: Diagnosis not present

## 2020-01-10 DIAGNOSIS — Z131 Encounter for screening for diabetes mellitus: Secondary | ICD-10-CM | POA: Diagnosis not present

## 2020-01-10 DIAGNOSIS — E785 Hyperlipidemia, unspecified: Secondary | ICD-10-CM | POA: Diagnosis not present

## 2020-01-10 DIAGNOSIS — G4700 Insomnia, unspecified: Secondary | ICD-10-CM | POA: Diagnosis not present

## 2020-01-10 DIAGNOSIS — I10 Essential (primary) hypertension: Secondary | ICD-10-CM | POA: Diagnosis not present

## 2020-01-10 DIAGNOSIS — I2510 Atherosclerotic heart disease of native coronary artery without angina pectoris: Secondary | ICD-10-CM | POA: Diagnosis not present

## 2020-01-18 DIAGNOSIS — G4733 Obstructive sleep apnea (adult) (pediatric): Secondary | ICD-10-CM | POA: Diagnosis not present

## 2020-01-24 DIAGNOSIS — I25119 Atherosclerotic heart disease of native coronary artery with unspecified angina pectoris: Secondary | ICD-10-CM | POA: Diagnosis not present

## 2020-01-24 DIAGNOSIS — G4700 Insomnia, unspecified: Secondary | ICD-10-CM | POA: Diagnosis not present

## 2020-01-24 DIAGNOSIS — E785 Hyperlipidemia, unspecified: Secondary | ICD-10-CM | POA: Diagnosis not present

## 2020-01-24 DIAGNOSIS — I10 Essential (primary) hypertension: Secondary | ICD-10-CM | POA: Diagnosis not present

## 2020-05-13 DIAGNOSIS — M4316 Spondylolisthesis, lumbar region: Secondary | ICD-10-CM | POA: Diagnosis not present

## 2020-05-15 DIAGNOSIS — G4733 Obstructive sleep apnea (adult) (pediatric): Secondary | ICD-10-CM | POA: Diagnosis not present

## 2020-05-17 ENCOUNTER — Other Ambulatory Visit: Payer: Self-pay | Admitting: Neurosurgery

## 2020-05-17 DIAGNOSIS — M4316 Spondylolisthesis, lumbar region: Secondary | ICD-10-CM

## 2020-06-22 ENCOUNTER — Ambulatory Visit
Admission: RE | Admit: 2020-06-22 | Discharge: 2020-06-22 | Disposition: A | Discharge status: Home / Self Care | Payer: Medicare Other | Source: Ambulatory Visit | Attending: Neurosurgery | Admitting: Neurosurgery

## 2020-06-22 ENCOUNTER — Other Ambulatory Visit: Payer: Self-pay

## 2020-06-22 DIAGNOSIS — M4316 Spondylolisthesis, lumbar region: Secondary | ICD-10-CM

## 2020-06-22 DIAGNOSIS — M48061 Spinal stenosis, lumbar region without neurogenic claudication: Secondary | ICD-10-CM | POA: Diagnosis not present

## 2020-06-22 MED ORDER — GADOBENATE DIMEGLUMINE 529 MG/ML IV SOLN
20.0000 mL | Freq: Once | INTRAVENOUS | Status: AC | PRN
  Administered 2020-06-22: 20 mL via INTRAVENOUS

## 2020-06-24 DIAGNOSIS — M4316 Spondylolisthesis, lumbar region: Secondary | ICD-10-CM | POA: Diagnosis not present

## 2020-06-27 ENCOUNTER — Other Ambulatory Visit: Payer: Self-pay | Admitting: Neurosurgery

## 2020-06-28 ENCOUNTER — Other Ambulatory Visit: Payer: Self-pay | Admitting: Neurosurgery

## 2020-07-08 ENCOUNTER — Encounter (HOSPITAL_COMMUNITY)
Admission: RE | Admit: 2020-07-08 | Discharge: 2020-07-08 | Disposition: A | Discharge status: Home / Self Care | Payer: Medicare Other | Source: Ambulatory Visit | Attending: Neurosurgery | Admitting: Neurosurgery

## 2020-07-08 ENCOUNTER — Encounter (HOSPITAL_COMMUNITY): Payer: Self-pay

## 2020-07-08 ENCOUNTER — Other Ambulatory Visit (HOSPITAL_COMMUNITY)
Admission: RE | Admit: 2020-07-08 | Discharge: 2020-07-08 | Disposition: A | Discharge status: Home / Self Care | Payer: Medicare Other | Source: Ambulatory Visit | Attending: Neurosurgery | Admitting: Neurosurgery

## 2020-07-08 ENCOUNTER — Other Ambulatory Visit: Payer: Self-pay

## 2020-07-08 DIAGNOSIS — Z79899 Other long term (current) drug therapy: Secondary | ICD-10-CM | POA: Diagnosis not present

## 2020-07-08 DIAGNOSIS — M4316 Spondylolisthesis, lumbar region: Secondary | ICD-10-CM | POA: Diagnosis not present

## 2020-07-08 DIAGNOSIS — Z87891 Personal history of nicotine dependence: Secondary | ICD-10-CM | POA: Diagnosis not present

## 2020-07-08 DIAGNOSIS — Z7982 Long term (current) use of aspirin: Secondary | ICD-10-CM | POA: Diagnosis not present

## 2020-07-08 DIAGNOSIS — Z20822 Contact with and (suspected) exposure to covid-19: Secondary | ICD-10-CM | POA: Diagnosis not present

## 2020-07-08 HISTORY — DX: Cardiac murmur, unspecified: R01.1

## 2020-07-08 HISTORY — DX: Sleep apnea, unspecified: G47.30

## 2020-07-08 LAB — TYPE AND SCREEN
ABO/RH(D): O POS
Antibody Screen: NEGATIVE

## 2020-07-08 LAB — BASIC METABOLIC PANEL
Anion gap: 11 (ref 5–15)
BUN: 18 mg/dL (ref 8–23)
CO2: 24 mmol/L (ref 22–32)
Calcium: 9 mg/dL (ref 8.9–10.3)
Chloride: 104 mmol/L (ref 98–111)
Creatinine, Ser: 0.93 mg/dL (ref 0.61–1.24)
GFR calc Af Amer: >60 mL/min (ref >60)
GFR calc non Af Amer: >60 mL/min (ref >60)
Glucose, Bld: 101 mg/dL — ABNORMAL HIGH (ref 70–99)
Potassium: 3.8 mmol/L (ref 3.5–5.1)
Sodium: 139 mmol/L (ref 135–145)

## 2020-07-08 LAB — SURGICAL PCR SCREEN
MRSA, PCR: NEGATIVE
Staphylococcus aureus: NEGATIVE

## 2020-07-08 LAB — CBC
HCT: 49.3 % (ref 39.0–52.0)
Hemoglobin: 16 g/dL (ref 13.0–17.0)
MCH: 29.2 pg (ref 26.0–34.0)
MCHC: 32.5 g/dL (ref 30.0–36.0)
MCV: 90 fL (ref 80.0–100.0)
Platelets: 279 10*3/uL (ref 150–400)
RBC: 5.48 MIL/uL (ref 4.22–5.81)
RDW: 13.2 % (ref 11.5–15.5)
WBC: 9.2 10*3/uL (ref 4.0–10.5)
nRBC: 0 % (ref 0.0–0.2)

## 2020-07-08 LAB — SARS CORONAVIRUS 2 (TAT 6-24 HRS): SARS Coronavirus 2: NEGATIVE

## 2020-07-08 NOTE — Pre-Procedure Instructions (Signed)
[place], [place] [address] Phone: [phone] Fax: [phone]     Your procedure is scheduled on Wednesday, July 14 from 09:00 AM- 1:33 PM.  Report to [facility] Main Entrance "A" at 07:00 A.M., and check in at the Admitting office.  Call this number if you have problems the morning of surgery:  [phone]  Call [phone] if you have any questions prior to your surgery date Monday-Friday 8am-4pm.    Remember:  Do not eat or drink after midnight the night before your surgery.     Take these medicines the morning of surgery with A SIP OF WATER: BYSTOLIC MYRBETRIQ  IF NEEDED: HYDROcodone-acetaminophen (NORCO/VICODIN) nitroGLYCERIN (NITROSTAT)   Follow your surgeon's instructions on when to stop Aspirin.  If no instructions were given by your surgeon then you will need to call the office to get those instructions.    As of today, STOP taking any Aleve, Naproxen, Ibuprofen, Motrin, Advil, Goody's, BC's, all herbal medications, fish oil, and all vitamins.      The Morning of Surgery:               Do not wear jewelry.            Do not wear lotions, powders, colognes, or deodorant.            Men may shave face and neck.            Do not bring valuables to the hospital.            [facility] is not responsible for any belongings or valuables.  Do NOT Smoke (Tobacco/Vaping) or drink Alcohol 24 hours prior to your procedure.  If you use a CPAP at night, you may bring all equipment for your overnight stay.   Contacts, glasses, dentures or bridgework may not be worn into surgery.      For patients admitted to the hospital, discharge time will be determined by your treatment team.   Patients discharged the day of surgery will not be allowed to drive home, and someone needs to stay with them for 24 hours.    Special instructions:   [place]- Preparing For Surgery  Before surgery, you can play an  important role. Because skin is not sterile, your skin needs to be as free of germs as possible. You can reduce the number of germs on your skin by washing with CHG (chlorahexidine gluconate) Soap before surgery.  CHG is an antiseptic cleaner which kills germs and bonds with the skin to continue killing germs even after washing.    Oral Hygiene is also important to reduce your risk of infection.  Remember - BRUSH YOUR TEETH THE MORNING OF SURGERY WITH YOUR REGULAR TOOTHPASTE  Please do not use if you have an allergy to CHG or antibacterial soaps. If your skin becomes reddened/irritated stop using the CHG.  Do not shave (including legs and underarms) for at least 48 hours prior to first CHG shower. It is OK to shave your face.  Please follow these instructions carefully.   1. Shower the NIGHT BEFORE SURGERY and the MORNING OF SURGERY with CHG Soap.   2. If you chose to wash your hair, wash your hair first as usual with your normal shampoo.  3. After you shampoo, rinse your hair and body thoroughly to remove the shampoo.  4. Use CHG as you would any other liquid soap. You can apply  CHG directly to the skin and wash gently with a scrungie or a clean washcloth.   5. Apply the CHG Soap to your body ONLY FROM THE NECK DOWN.  Do not use on open wounds or open sores. Avoid contact with your eyes, ears, mouth and genitals (private parts). Wash Face and genitals (private parts)  with your normal soap.   6. Wash thoroughly, paying special attention to the area where your surgery will be performed.  7. Thoroughly rinse your body with warm water from the neck down.  8. DO NOT shower/wash with your normal soap after using and rinsing off the CHG Soap.  9. Pat yourself dry with a CLEAN TOWEL.  10. Wear CLEAN PAJAMAS to bed the night before surgery  11. Place CLEAN SHEETS on your bed the night of your first shower and DO NOT SLEEP WITH PETS.   Day of Surgery: Wear Clean/Comfortable clothing the  morning of surgery Do not apply any deodorants/lotions.   Remember to brush your teeth WITH YOUR REGULAR TOOTHPASTE.   Please read over the following fact sheets that you were given.

## 2020-07-08 NOTE — Progress Notes (Signed)
PCP - Allyn Kenner, MD Cardiologist - Quay Burow, MD  PPM/ICD - Denies  Chest x-ray - N/A EKG - 10/06/19 Stress Test - 01/27/12 ECHO - 05/24/18 Cardiac Cath - 11/09/09  Sleep Study - 2019 CPAP - Yes  Patient denies being diabetic.  Blood Thinner Instructions: N/A Aspirin Instructions: Per patient, he asked Dr. Christella Noa who told him to continue ASA.  ERAS Protcol - N/A PRE-SURGERY Ensure or G2- N/A  COVID TEST- 07/08/20   Anesthesia review: Yes, review cardiac cath, ECHO and stress test.  Patient denies shortness of breath, fever, cough and chest pain at PAT appointment   All instructions explained to the patient, with a verbal understanding of the material. Patient agrees to go over the instructions while at home for a better understanding. Patient also instructed to self quarantine after being tested for COVID-19. The opportunity to ask questions was provided.

## 2020-07-09 ENCOUNTER — Encounter (HOSPITAL_COMMUNITY): Payer: Self-pay

## 2020-07-09 MED ORDER — DEXTROSE 5 % IV SOLN
3.0000 g | INTRAVENOUS | Status: AC
  Administered 2020-07-10: 3 g via INTRAVENOUS
  Filled 2020-07-09: qty 3

## 2020-07-09 NOTE — Progress Notes (Addendum)
Anesthesia Chart Review:  Case: [id number] Date/Time: 07/10/20 0845   Procedure: POSTERIOR LUMBAR INTERBODY FUSION LUMBAR 4- LUMBAR 5 (N/A ) - 3C   Anesthesia type: General   Pre-op diagnosis: SPONDYLOLISTHESIS OF LUMBAR REGION   Location: [facility] OR ROOM 18 / [facility] OR   Surgeons: Ashok Pall, MD      DISCUSSION: Patient is a 69 year old male scheduled for the above procedure.  History includes former smoker (quit 01/25/09), HTN, HLD, CAD (NSTEMI; s/p DES pLAD 11/08/09; other vessels free of disease), childhood murmur (only trivial regurgitation on 2019 echo), hiatal hernia, GERD, OSA (CPAP), CAP (04/2018). BMI is consistent with morbid obesity.  Last visit with cardiologist Dr. Gwenlyn Found was on 10/06/19. He was without chest pain or SOB. Echo in 04/2018 showed grade 1 diastolic dysfunction, but otherwise unremarkable. Last stress test in 2013 (done for chest pain evaluation) was non-ischemic. Patient was exercising in the pool then due to back issues. One year follow-up recommended.  I called and spoke with Antonio Colon. He continues to feel well from a cardiopulmonary standpoint. He denied chest pain, SOB, edema, syncope. 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 He says there are days he walks up to a mile at a time. His walks in town include an incline that does not cause any chest pain symptoms. He is able to walk up stairs in his house multiple times a day. He tolerated an out-patient L4-5 laminectomy at [facility] in 10/2019. He says that Dr. Christella Noa told he he could continue ASA 81 mg perioperatively (discussed with Janett Billow at Dr. Lacy Duverney office). He confirmed that he had not had a carotid stent (it was a cardiac stent), so this was removed from his history.   Reviewed with anesthesiologist Hoy Morn, MD. Patient is up to date with cardiology follow-up and remains asymptomatic. Anesthesia team to evaluate on  the day of surgery. 07/08/20 preoperative COVID-19 test was negative.   VS: BP 130/62   Pulse 73   Temp 36.9 C (Oral)   Resp 20   Ht 5\' 10"  (1.778 m)   Wt (!) 148.6 kg   SpO2 97%   BMI 47.02 kg/m   PROVIDERS: Sinda Du, MD is listed as PCP, but recently retired. PCP is now Antonio Capers, MD Quay Burow, MD is cardiologist. Last visit 10/06/19 with one year follow-up recommended.   LABS: Labs reviewed: Acceptable for surgery. (all labs ordered are listed, but only abnormal results are displayed)  Labs Reviewed  BASIC METABOLIC PANEL - Abnormal; Notable for the following components:      Result Value   Glucose, Bld 101 (*)    All other components within normal limits  SURGICAL PCR SCREEN  CBC  TYPE AND SCREEN    OTHER: Sleep Study 08/02/18: Impressions: Moderate obstructive sleep apnea is observed.  A formal CPAP titration study is recommended.  PFTs 06/28/18: FVC 4.32 (99%), post 4.28 (98%). FEV1 3.18 (98%), post 2.58 (79%). DLCO unc 41.89 (135%).   IMAGES: MRI L-spine 06/22/20: IMPRESSION: 1. 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 2. L4-5 advanced thecal sac stenosis despite interval decompression. Biforaminal stenosis at this level from residual bulky facet spurring. There is active bilateral facet arthritis with marrow edema. 3. L5-S1 advanced left foraminal impingement primarily from facet spurring. 4. L3-4 high-grade stenosis. Moderate bilateral foraminal narrowing.   EKG: 10/06/19 ([facility]): Sinus rhythm with first-degree AV block   CV: Echo  05/24/18: Study Conclusions  - Left ventricle: The cavity size was normal. Wall thickness was  increased in a pattern of moderate LVH. Definity contrast was  used due to limited images. Systolic function was normal. The  estimated ejection fraction was in the range of 55% to 60%. Wall  motion was normal; there were no regional wall motion  abnormalities. Doppler  parameters are consistent with abnormal  left ventricular relaxation (grade 1 diastolic dysfunction).  - Aortic valve: Poorly visualized. Mildly calcified leaflets. There  was trivial regurgitation.  - Mitral valve: There was trivial regurgitation.  - Right ventricle: The cavity size was mildly dilated.  - Right atrium: Central venous pressure (est): 3 mm Hg.  - Atrial septum: No defect or patent foramen ovale was identified.  - Tricuspid valve: There was trivial regurgitation.  - Pulmonary arteries: Systolic pressure could not be accurately  estimated.  - Pericardium, extracardiac: A prominent pericardial fat pad was  present.    Exercise Nuclear stress test 01/27/12: Impression: Exercise capacity 12 METS.  EKG is negative for ischemia. Normal myocardial perfusion study.  No significant ischemia demonstrated.  Post-rest EF is 61%.  This is a low risk scan.  Compared to the previous study, there is no significant change.   Cardiac cath 11/08/09: 1.  Left main was normal. 2.  Circumflex: Circumflex gave rise to 1 large OM vessel.  It was free of disease. 3.  LAD: There was a proximal area of 90% narrowing just distal to the diagonal.  The distal LAD and the diagonal itself was free of disease. 4.  RCA: The RCA revealed the proximal, mid, and distal segments to be dilated and ectatic.  There was a very large PDA and 3 large posterior lateral vessels, all of which were free of disease. PCI: S/p 3.0 x 18 Endeavor DES to proximal LAD    Past Medical History:  Diagnosis Date  . CAD (coronary artery disease)    stents  . GERD (gastroesophageal reflux disease)   . Heart disease   . Heart murmur    childhood  . Hiatal hernia   . History of stress test 01/27/2012   Normal study. No significant ischemia demonstration, this low risk scan, no significant change compared to previous study.  . Hypercholesteremia   . Hyperlipidemia   . Hypertension   . Myocardial infarction ([place])  2010  . Obesity   . Pneumonia 2019  . Sleep apnea    cpap at night    Past Surgical History:  Procedure Laterality Date  . BACK SURGERY  10/2019   Dr. Christella Noa: right L4-5 laminectomy  . CARDIAC CATHETERIZATION  10/2009   CAD stents to the LAD using an Endeavor drug eluting (3x2mm) by Dr Rex Kras, he had normal circumflex and RCA at the time.  . COLONOSCOPY  06/15/2012  . COLONOSCOPY N/A 01/09/2019   Procedure: COLONOSCOPY;  Surgeon: Rogene Houston, MD;  [facility];  Service: Endoscopy;  Laterality: N/A;  2:10pm  . ESOPHAGOGASTRODUODENOSCOPY N/A 11/15/2015   Procedure: ESOPHAGOGASTRODUODENOSCOPY (EGD);  Surgeon: Rogene Houston, MD;  Location: [facility];  Service: Endoscopy;  Laterality: N/A;  1:10 - moved to 12:55 - Ann notified pt  . POLYPECTOMY  01/09/2019   Procedure: POLYPECTOMY;  Surgeon: Rogene Houston, MD;  Location: [facility];  Service: Endoscopy;;  colon  . TONSILLECTOMY      MEDICATIONS: . ALPRAZolam (XANAX) 0.25 MG tablet  . aspirin EC 81 MG tablet  . BYSTOLIC 5  MG tablet  . HYDROcodone-acetaminophen (NORCO/VICODIN) 5-325 MG tablet  . losartan-hydrochlorothiazide (HYZAAR) 100-12.5 MG tablet  . Menthol, Topical Analgesic, (BIOFREEZE EX)  . Multiple Vitamin (MULTIVITAMIN WITH MINERALS) TABS tablet  . MYRBETRIQ 50 MG TB24 tablet  . nitroGLYCERIN (NITROSTAT) 0.4 MG SL tablet  . rosuvastatin (CRESTOR) 5 MG tablet   No current facility-administered medications for this encounter.   Derrill Memo ON 07/10/2020] ceFAZolin (ANCEF) 3 g in dextrose 5 % 50 mL IVPB    Myra Gianotti, PA-C Surgical Short Stay/Anesthesiology [facility] [place] Phone [phone] [facility] Phone [phone] 07/09/2020 1:43 PM

## 2020-07-10 ENCOUNTER — Inpatient Hospital Stay (HOSPITAL_COMMUNITY): Payer: Medicare Other | Admitting: Vascular Surgery

## 2020-07-10 ENCOUNTER — Encounter (HOSPITAL_COMMUNITY)
Admission: RE | Disposition: A | Discharge status: Home / Self Care | Payer: Self-pay | Source: Home / Self Care | Attending: Neurosurgery

## 2020-07-10 ENCOUNTER — Inpatient Hospital Stay (HOSPITAL_COMMUNITY): Payer: Medicare Other

## 2020-07-10 ENCOUNTER — Other Ambulatory Visit: Payer: Self-pay

## 2020-07-10 ENCOUNTER — Encounter (HOSPITAL_COMMUNITY): Payer: Self-pay | Admitting: Neurosurgery

## 2020-07-10 ENCOUNTER — Inpatient Hospital Stay (HOSPITAL_COMMUNITY): Payer: Medicare Other | Admitting: Anesthesiology

## 2020-07-10 ENCOUNTER — Inpatient Hospital Stay (HOSPITAL_COMMUNITY)
Admission: RE | Admit: 2020-07-10 | Discharge: 2020-07-12 | DRG: 460 | Disposition: A | Discharge status: Home / Self Care | Payer: Medicare Other | Attending: Neurosurgery | Admitting: Neurosurgery

## 2020-07-10 DIAGNOSIS — I509 Heart failure, unspecified: Secondary | ICD-10-CM | POA: Diagnosis not present

## 2020-07-10 DIAGNOSIS — Z79899 Other long term (current) drug therapy: Secondary | ICD-10-CM

## 2020-07-10 DIAGNOSIS — Z87891 Personal history of nicotine dependence: Secondary | ICD-10-CM | POA: Diagnosis not present

## 2020-07-10 DIAGNOSIS — Z7982 Long term (current) use of aspirin: Secondary | ICD-10-CM | POA: Diagnosis not present

## 2020-07-10 DIAGNOSIS — Z20822 Contact with and (suspected) exposure to covid-19: Secondary | ICD-10-CM | POA: Diagnosis present

## 2020-07-10 DIAGNOSIS — I2510 Atherosclerotic heart disease of native coronary artery without angina pectoris: Secondary | ICD-10-CM | POA: Diagnosis not present

## 2020-07-10 DIAGNOSIS — Z419 Encounter for procedure for purposes other than remedying health state, unspecified: Secondary | ICD-10-CM

## 2020-07-10 DIAGNOSIS — M4316 Spondylolisthesis, lumbar region: Secondary | ICD-10-CM | POA: Diagnosis present

## 2020-07-10 DIAGNOSIS — I110 Hypertensive heart disease with heart failure: Secondary | ICD-10-CM | POA: Diagnosis not present

## 2020-07-10 DIAGNOSIS — Z981 Arthrodesis status: Secondary | ICD-10-CM | POA: Diagnosis not present

## 2020-07-10 LAB — URINALYSIS, ROUTINE W REFLEX MICROSCOPIC
Bilirubin Urine: NEGATIVE
Glucose, UA: NEGATIVE mg/dL
Ketones, ur: NEGATIVE mg/dL
Leukocytes,Ua: NEGATIVE
Nitrite: NEGATIVE
Protein, ur: NEGATIVE mg/dL
Specific Gravity, Urine: 1.028 (ref 1.005–1.030)
pH: 5 (ref 5.0–8.0)

## 2020-07-10 LAB — GRAM STAIN

## 2020-07-10 LAB — ABO/RH: ABO/RH(D): O POS

## 2020-07-10 SURGERY — POSTERIOR LUMBAR FUSION 1 LEVEL
Anesthesia: General | Site: Spine Lumbar

## 2020-07-10 MED ORDER — NITROGLYCERIN 0.4 MG SL SUBL: 0.4000 mg | SUBLINGUAL_TABLET | SUBLINGUAL | Status: DC | PRN

## 2020-07-10 MED ORDER — OXYCODONE HCL 5 MG PO TABS
5.0000 mg | ORAL_TABLET | Freq: Once | ORAL | Status: AC | PRN
  Administered 2020-07-10: 5 mg via ORAL

## 2020-07-10 MED ORDER — ROCURONIUM BROMIDE 10 MG/ML (PF) SYRINGE
PREFILLED_SYRINGE | INTRAVENOUS | Status: AC
  Filled 2020-07-10: qty 20

## 2020-07-10 MED ORDER — ROSUVASTATIN CALCIUM 5 MG PO TABS
5.0000 mg | ORAL_TABLET | Freq: Every evening | ORAL | Status: DC
  Administered 2020-07-10 – 2020-07-11 (×2): 5 mg via ORAL
  Filled 2020-07-10 (×2): qty 1

## 2020-07-10 MED ORDER — ACETAMINOPHEN 325 MG PO TABS: 650.0000 mg | ORAL_TABLET | ORAL | Status: DC | PRN

## 2020-07-10 MED ORDER — OXYCODONE HCL 5 MG/5ML PO SOLN: 5.0000 mg | Freq: Once | ORAL | Status: AC | PRN

## 2020-07-10 MED ORDER — SODIUM CHLORIDE 0.9% FLUSH
3.0000 mL | Freq: Two times a day (BID) | INTRAVENOUS | Status: DC
  Administered 2020-07-10 – 2020-07-12 (×4): 3 mL via INTRAVENOUS

## 2020-07-10 MED ORDER — ROCURONIUM BROMIDE 100 MG/10ML IV SOLN
INTRAVENOUS | Status: DC | PRN
  Administered 2020-07-10: 100 mg via INTRAVENOUS
  Administered 2020-07-10: 20 mg via INTRAVENOUS
  Administered 2020-07-10 (×2): 10 mg via INTRAVENOUS
  Administered 2020-07-10: 50 mg via INTRAVENOUS

## 2020-07-10 MED ORDER — LACTATED RINGERS IV SOLN: INTRAVENOUS | Status: DC | PRN

## 2020-07-10 MED ORDER — THROMBIN 20000 UNITS EX SOLR
CUTANEOUS | Status: AC
  Filled 2020-07-10: qty 20000

## 2020-07-10 MED ORDER — SENNA 8.6 MG PO TABS
1.0000 | ORAL_TABLET | Freq: Two times a day (BID) | ORAL | Status: DC
  Administered 2020-07-10 – 2020-07-12 (×4): 8.6 mg via ORAL
  Filled 2020-07-10 (×4): qty 1

## 2020-07-10 MED ORDER — POTASSIUM CHLORIDE IN NACL 20-0.9 MEQ/L-% IV SOLN
INTRAVENOUS | Status: DC
  Filled 2020-07-10: qty 1000

## 2020-07-10 MED ORDER — DIAZEPAM 5 MG PO TABS
5.0000 mg | ORAL_TABLET | Freq: Four times a day (QID) | ORAL | Status: DC | PRN
  Administered 2020-07-10: 5 mg via ORAL

## 2020-07-10 MED ORDER — 0.9 % SODIUM CHLORIDE (POUR BTL) OPTIME
TOPICAL | Status: DC | PRN
  Administered 2020-07-10 (×2): 1000 mL

## 2020-07-10 MED ORDER — AMISULPRIDE (ANTIEMETIC) 5 MG/2ML IV SOLN
10.0000 mg | Freq: Once | INTRAVENOUS | Status: AC
  Administered 2020-07-10: 10 mg via INTRAVENOUS

## 2020-07-10 MED ORDER — BUPIVACAINE HCL (PF) 0.5 % IJ SOLN
INTRAMUSCULAR | Status: AC
  Filled 2020-07-10: qty 30

## 2020-07-10 MED ORDER — ONDANSETRON HCL 4 MG/2ML IJ SOLN: 4.0000 mg | Freq: Four times a day (QID) | INTRAMUSCULAR | Status: DC | PRN

## 2020-07-10 MED ORDER — PHENYLEPHRINE HCL-NACL 10-0.9 MG/250ML-% IV SOLN
INTRAVENOUS | Status: DC | PRN
  Administered 2020-07-10: 30 ug/min via INTRAVENOUS

## 2020-07-10 MED ORDER — HYDROMORPHONE HCL 1 MG/ML IJ SOLN
0.2500 mg | INTRAMUSCULAR | Status: DC | PRN
  Administered 2020-07-10: 0.5 mg via INTRAVENOUS

## 2020-07-10 MED ORDER — OXYCODONE HCL 5 MG PO TABS
ORAL_TABLET | ORAL | Status: AC
  Filled 2020-07-10: qty 1

## 2020-07-10 MED ORDER — ONDANSETRON HCL 4 MG/2ML IJ SOLN
INTRAMUSCULAR | Status: AC
  Filled 2020-07-10: qty 2

## 2020-07-10 MED ORDER — LOSARTAN POTASSIUM 50 MG PO TABS
100.0000 mg | ORAL_TABLET | Freq: Every day | ORAL | Status: DC
  Administered 2020-07-10 – 2020-07-12 (×3): 100 mg via ORAL
  Filled 2020-07-10 (×3): qty 2

## 2020-07-10 MED ORDER — ALBUMIN HUMAN 5 % IV SOLN: INTRAVENOUS | Status: DC | PRN

## 2020-07-10 MED ORDER — EPHEDRINE 5 MG/ML INJ
INTRAVENOUS | Status: AC
  Filled 2020-07-10: qty 10

## 2020-07-10 MED ORDER — ACETAMINOPHEN 500 MG PO TABS
1000.0000 mg | ORAL_TABLET | Freq: Once | ORAL | Status: AC
  Administered 2020-07-10: 1000 mg via ORAL
  Filled 2020-07-10: qty 2

## 2020-07-10 MED ORDER — FENTANYL CITRATE (PF) 250 MCG/5ML IJ SOLN
INTRAMUSCULAR | Status: AC
  Filled 2020-07-10: qty 5

## 2020-07-10 MED ORDER — ONDANSETRON HCL 4 MG PO TABS: 4.0000 mg | ORAL_TABLET | Freq: Four times a day (QID) | ORAL | Status: DC | PRN

## 2020-07-10 MED ORDER — HYDROCHLOROTHIAZIDE 12.5 MG PO CAPS
12.5000 mg | ORAL_CAPSULE | Freq: Every day | ORAL | Status: DC
  Administered 2020-07-10 – 2020-07-12 (×3): 12.5 mg via ORAL
  Filled 2020-07-10 (×3): qty 1

## 2020-07-10 MED ORDER — OXYCODONE HCL ER 15 MG PO T12A
15.0000 mg | EXTENDED_RELEASE_TABLET | Freq: Two times a day (BID) | ORAL | Status: DC
  Administered 2020-07-10 – 2020-07-12 (×4): 15 mg via ORAL
  Filled 2020-07-10 (×4): qty 1

## 2020-07-10 MED ORDER — NEBIVOLOL HCL 5 MG PO TABS
5.0000 mg | ORAL_TABLET | Freq: Every day | ORAL | Status: DC
  Administered 2020-07-11 – 2020-07-12 (×2): 5 mg via ORAL
  Filled 2020-07-10 (×2): qty 1

## 2020-07-10 MED ORDER — LIDOCAINE 2% (20 MG/ML) 5 ML SYRINGE
INTRAMUSCULAR | Status: AC
  Filled 2020-07-10: qty 5

## 2020-07-10 MED ORDER — SODIUM CHLORIDE 0.9% FLUSH: 3.0000 mL | INTRAVENOUS | Status: DC | PRN

## 2020-07-10 MED ORDER — PROPOFOL 10 MG/ML IV BOLUS
INTRAVENOUS | Status: DC | PRN
  Administered 2020-07-10: 200 mg via INTRAVENOUS

## 2020-07-10 MED ORDER — CHLORHEXIDINE GLUCONATE CLOTH 2 % EX PADS: 6.0000 | MEDICATED_PAD | Freq: Once | CUTANEOUS | Status: DC

## 2020-07-10 MED ORDER — SODIUM CHLORIDE 0.9 % IV SOLN
250.0000 mL | INTRAVENOUS | Status: DC
  Administered 2020-07-10: 250 mL via INTRAVENOUS

## 2020-07-10 MED ORDER — BUPIVACAINE HCL (PF) 0.5 % IJ SOLN
INTRAMUSCULAR | Status: DC | PRN
  Administered 2020-07-10: 30 mL

## 2020-07-10 MED ORDER — EPHEDRINE SULFATE 50 MG/ML IJ SOLN
INTRAMUSCULAR | Status: DC | PRN
  Administered 2020-07-10: 5 mg via INTRAVENOUS

## 2020-07-10 MED ORDER — AMISULPRIDE (ANTIEMETIC) 5 MG/2ML IV SOLN
INTRAVENOUS | Status: AC
  Filled 2020-07-10: qty 4

## 2020-07-10 MED ORDER — CHLORHEXIDINE GLUCONATE 0.12 % MT SOLN
15.0000 mL | Freq: Once | OROMUCOSAL | Status: AC
  Administered 2020-07-10: 15 mL via OROMUCOSAL
  Filled 2020-07-10: qty 15

## 2020-07-10 MED ORDER — DIAZEPAM 5 MG PO TABS
ORAL_TABLET | ORAL | Status: AC
  Filled 2020-07-10: qty 1

## 2020-07-10 MED ORDER — ASPIRIN EC 81 MG PO TBEC
81.0000 mg | DELAYED_RELEASE_TABLET | Freq: Every day | ORAL | Status: DC
  Administered 2020-07-11 – 2020-07-12 (×2): 81 mg via ORAL
  Filled 2020-07-10 (×2): qty 1

## 2020-07-10 MED ORDER — LACTATED RINGERS IV SOLN: INTRAVENOUS | Status: DC

## 2020-07-10 MED ORDER — ONDANSETRON HCL 4 MG/2ML IJ SOLN
INTRAMUSCULAR | Status: DC | PRN
  Administered 2020-07-10: 4 mg via INTRAVENOUS

## 2020-07-10 MED ORDER — ZOLPIDEM TARTRATE 5 MG PO TABS: 5.0000 mg | ORAL_TABLET | Freq: Every evening | ORAL | Status: DC | PRN

## 2020-07-10 MED ORDER — OXYCODONE HCL 5 MG PO TABS: 10.0000 mg | ORAL_TABLET | ORAL | Status: DC | PRN

## 2020-07-10 MED ORDER — PHENYLEPHRINE 40 MCG/ML (10ML) SYRINGE FOR IV PUSH (FOR BLOOD PRESSURE SUPPORT)
PREFILLED_SYRINGE | INTRAVENOUS | Status: AC
  Filled 2020-07-10: qty 10

## 2020-07-10 MED ORDER — ORAL CARE MOUTH RINSE: 15.0000 mL | Freq: Once | OROMUCOSAL | Status: AC

## 2020-07-10 MED ORDER — MIRABEGRON ER 50 MG PO TB24
50.0000 mg | ORAL_TABLET | Freq: Every day | ORAL | Status: DC
  Administered 2020-07-11 – 2020-07-12 (×2): 50 mg via ORAL
  Filled 2020-07-10 (×2): qty 1

## 2020-07-10 MED ORDER — HYDROMORPHONE HCL 1 MG/ML IJ SOLN
INTRAMUSCULAR | Status: AC
  Filled 2020-07-10: qty 1

## 2020-07-10 MED ORDER — DEXTROSE 5 % IV SOLN
3.0000 g | Freq: Three times a day (TID) | INTRAVENOUS | Status: AC
  Administered 2020-07-10 – 2020-07-11 (×2): 3 g via INTRAVENOUS
  Filled 2020-07-10 (×2): qty 3000

## 2020-07-10 MED ORDER — MAGNESIUM CITRATE PO SOLN: 1.0000 | Freq: Once | ORAL | Status: DC | PRN

## 2020-07-10 MED ORDER — MORPHINE SULFATE (PF) 4 MG/ML IV SOLN: 4.0000 mg | INTRAVENOUS | Status: DC | PRN

## 2020-07-10 MED ORDER — LOSARTAN POTASSIUM-HCTZ 100-12.5 MG PO TABS: 1.0000 | ORAL_TABLET | Freq: Every day | ORAL | Status: DC

## 2020-07-10 MED ORDER — ALPRAZOLAM 0.25 MG PO TABS
0.2500 mg | ORAL_TABLET | Freq: Every day | ORAL | Status: DC
  Administered 2020-07-10 – 2020-07-11 (×2): 0.25 mg via ORAL
  Filled 2020-07-10 (×2): qty 1

## 2020-07-10 MED ORDER — LIDOCAINE-EPINEPHRINE 0.5 %-1:200000 IJ SOLN
INTRAMUSCULAR | Status: AC
  Filled 2020-07-10: qty 1

## 2020-07-10 MED ORDER — PHENOL 1.4 % MT LIQD: 1.0000 | OROMUCOSAL | Status: DC | PRN

## 2020-07-10 MED ORDER — BISACODYL 5 MG PO TBEC: 5.0000 mg | DELAYED_RELEASE_TABLET | Freq: Every day | ORAL | Status: DC | PRN

## 2020-07-10 MED ORDER — ACETAMINOPHEN 650 MG RE SUPP: 650.0000 mg | RECTAL | Status: DC | PRN

## 2020-07-10 MED ORDER — SENNOSIDES-DOCUSATE SODIUM 8.6-50 MG PO TABS: 1.0000 | ORAL_TABLET | Freq: Every evening | ORAL | Status: DC | PRN

## 2020-07-10 MED ORDER — FENTANYL CITRATE (PF) 100 MCG/2ML IJ SOLN
INTRAMUSCULAR | Status: DC | PRN
  Administered 2020-07-10: 100 ug via INTRAVENOUS
  Administered 2020-07-10: 150 ug via INTRAVENOUS
  Administered 2020-07-10 (×3): 50 ug via INTRAVENOUS

## 2020-07-10 MED ORDER — OXYCODONE HCL 5 MG PO TABS: 5.0000 mg | ORAL_TABLET | ORAL | Status: DC | PRN

## 2020-07-10 MED ORDER — DEXAMETHASONE SODIUM PHOSPHATE 4 MG/ML IJ SOLN
INTRAMUSCULAR | Status: DC | PRN
  Administered 2020-07-10: 5 mg via INTRAVENOUS

## 2020-07-10 MED ORDER — LIDOCAINE HCL (CARDIAC) PF 100 MG/5ML IV SOSY
PREFILLED_SYRINGE | INTRAVENOUS | Status: DC | PRN
  Administered 2020-07-10: 60 mg via INTRAVENOUS

## 2020-07-10 MED ORDER — THROMBIN 20000 UNITS EX SOLR: CUTANEOUS | Status: DC | PRN

## 2020-07-10 MED ORDER — LIDOCAINE-EPINEPHRINE 0.5 %-1:200000 IJ SOLN
INTRAMUSCULAR | Status: DC | PRN
  Administered 2020-07-10: 10 mL

## 2020-07-10 MED ORDER — ONDANSETRON HCL 4 MG/2ML IJ SOLN
4.0000 mg | Freq: Once | INTRAMUSCULAR | Status: AC | PRN
  Administered 2020-07-10: 4 mg via INTRAVENOUS

## 2020-07-10 MED ORDER — DEXAMETHASONE SODIUM PHOSPHATE 10 MG/ML IJ SOLN
INTRAMUSCULAR | Status: AC
  Filled 2020-07-10: qty 1

## 2020-07-10 MED ORDER — SUGAMMADEX SODIUM 500 MG/5ML IV SOLN
INTRAVENOUS | Status: AC
  Filled 2020-07-10: qty 5

## 2020-07-10 MED ORDER — PHENYLEPHRINE HCL (PRESSORS) 10 MG/ML IV SOLN
INTRAVENOUS | Status: DC | PRN
  Administered 2020-07-10: 80 ug via INTRAVENOUS

## 2020-07-10 MED ORDER — METHOCARBAMOL 500 MG PO TABS
ORAL_TABLET | ORAL | Status: AC
  Filled 2020-07-10: qty 1

## 2020-07-10 MED ORDER — KETOROLAC TROMETHAMINE 15 MG/ML IJ SOLN
INTRAMUSCULAR | Status: AC
  Filled 2020-07-10: qty 1

## 2020-07-10 MED ORDER — PROPOFOL 10 MG/ML IV BOLUS
INTRAVENOUS | Status: AC
  Filled 2020-07-10: qty 20

## 2020-07-10 MED ORDER — MENTHOL 3 MG MT LOZG: 1.0000 | LOZENGE | OROMUCOSAL | Status: DC | PRN

## 2020-07-10 MED ORDER — KETOROLAC TROMETHAMINE 15 MG/ML IJ SOLN
7.5000 mg | Freq: Four times a day (QID) | INTRAMUSCULAR | Status: AC
  Administered 2020-07-10 – 2020-07-11 (×4): 7.5 mg via INTRAVENOUS
  Filled 2020-07-10 (×4): qty 1

## 2020-07-10 MED ORDER — SUGAMMADEX SODIUM 500 MG/5ML IV SOLN
INTRAVENOUS | Status: DC | PRN
  Administered 2020-07-10: 300 mg via INTRAVENOUS

## 2020-07-10 MED ORDER — ADULT MULTIVITAMIN W/MINERALS CH
1.0000 | ORAL_TABLET | Freq: Every day | ORAL | Status: DC
  Administered 2020-07-10 – 2020-07-12 (×3): 1 via ORAL
  Filled 2020-07-10 (×3): qty 1

## 2020-07-10 SURGICAL SUPPLY — 63 items
ADH SKN CLS APL DERMABOND .7 (GAUZE/BANDAGES/DRESSINGS) ×1
BIT DRILL PLIF MAS DISP 5.5MM (DRILL) IMPLANT
BLADE CLIPPER SURG (BLADE) IMPLANT
BUR MATCHSTICK NEURO 3.0 LAGG (BURR) ×2 IMPLANT
BUR PRECISION FLUTE 5.0 (BURR) ×2 IMPLANT
CANISTER SUCT 3000ML PPV (MISCELLANEOUS) ×2 IMPLANT
CAP RELINE MOD TULIP RMM (Cap) ×4 IMPLANT
CARTRIDGE OIL MAESTRO DRILL (MISCELLANEOUS) ×1 IMPLANT
CNTNR URN SCR LID CUP LEK RST (MISCELLANEOUS) ×1 IMPLANT
CONT SPEC 4OZ STRL OR WHT (MISCELLANEOUS) ×2
COVER BACK TABLE 60X90IN (DRAPES) ×2 IMPLANT
COVER WAND RF STERILE (DRAPES) ×2 IMPLANT
DECANTER SPIKE VIAL GLASS SM (MISCELLANEOUS) ×2 IMPLANT
DERMABOND ADVANCED (GAUZE/BANDAGES/DRESSINGS) ×1
DERMABOND ADVANCED .7 DNX12 (GAUZE/BANDAGES/DRESSINGS) ×1 IMPLANT
DIFFUSER DRILL AIR PNEUMATIC (MISCELLANEOUS) ×2 IMPLANT
DRAPE C-ARM 42X72 X-RAY (DRAPES) ×4 IMPLANT
DRAPE C-ARMOR (DRAPES) IMPLANT
DRAPE LAPAROTOMY 100X72X124 (DRAPES) ×2 IMPLANT
DRAPE SURG 17X23 STRL (DRAPES) ×2 IMPLANT
DRILL PLIF MAS DISP 5.5MM (DRILL) ×2
DRSG OPSITE POSTOP 4X6 (GAUZE/BANDAGES/DRESSINGS) ×1 IMPLANT
DURAPREP 26ML APPLICATOR (WOUND CARE) ×2 IMPLANT
ELECT REM PT RETURN 9FT ADLT (ELECTROSURGICAL) ×2
ELECTRODE REM PT RTRN 9FT ADLT (ELECTROSURGICAL) ×1 IMPLANT
GLOVE BIOGEL PI IND STRL 7.0 (GLOVE) IMPLANT
GLOVE BIOGEL PI IND STRL 7.5 (GLOVE) IMPLANT
GLOVE BIOGEL PI IND STRL 8 (GLOVE) IMPLANT
GLOVE BIOGEL PI INDICATOR 7.0 (GLOVE) ×4
GLOVE BIOGEL PI INDICATOR 7.5 (GLOVE) ×3
GLOVE BIOGEL PI INDICATOR 8 (GLOVE) ×4
GLOVE ECLIPSE 6.5 STRL STRAW (GLOVE) ×4 IMPLANT
GLOVE ECLIPSE 7.5 STRL STRAW (GLOVE) ×1 IMPLANT
GLOVE EXAM NITRILE XL STR (GLOVE) IMPLANT
GOWN STRL REUS W/ TWL LRG LVL3 (GOWN DISPOSABLE) ×2 IMPLANT
GOWN STRL REUS W/ TWL XL LVL3 (GOWN DISPOSABLE) IMPLANT
GOWN STRL REUS W/TWL 2XL LVL3 (GOWN DISPOSABLE) ×1 IMPLANT
GOWN STRL REUS W/TWL LRG LVL3 (GOWN DISPOSABLE) ×6
GOWN STRL REUS W/TWL XL LVL3 (GOWN DISPOSABLE) ×2
KIT BASIN OR (CUSTOM PROCEDURE TRAY) ×2 IMPLANT
KIT POSITION SURG JACKSON T1 (MISCELLANEOUS) ×2 IMPLANT
KIT TURNOVER KIT B (KITS) ×2 IMPLANT
MILL MEDIUM DISP (BLADE) ×1 IMPLANT
NDL HYPO 25X1 1.5 SAFETY (NEEDLE) ×1 IMPLANT
NDL SPNL 18GX3.5 QUINCKE PK (NEEDLE) IMPLANT
NEEDLE HYPO 25X1 1.5 SAFETY (NEEDLE) ×2 IMPLANT
NEEDLE SPNL 18GX3.5 QUINCKE PK (NEEDLE) ×2 IMPLANT
NS IRRIG 1000ML POUR BTL (IV SOLUTION) ×3 IMPLANT
OIL CARTRIDGE MAESTRO DRILL (MISCELLANEOUS) ×2
PACK LAMINECTOMY NEURO (CUSTOM PROCEDURE TRAY) ×2 IMPLANT
ROD RELINE COCR LORD 5X30 (Rod) ×2 IMPLANT
SCREW LOCK RSS 4.5/5.0MM (Screw) ×4 IMPLANT
SCREW SHANK RELINE 6.5X40MM (Screw) ×3 IMPLANT
SCREW SHANK RELINE MOD 6.5X35 (Screw) ×1 IMPLANT
SPONGE SURGIFOAM ABS GEL 100 (HEMOSTASIS) ×2 IMPLANT
SUT VIC AB 0 CT1 18XCR BRD8 (SUTURE) ×1 IMPLANT
SUT VIC AB 0 CT1 8-18 (SUTURE) ×4
SUT VIC AB 2-0 CT1 18 (SUTURE) ×2 IMPLANT
SUT VIC AB 3-0 SH 8-18 (SUTURE) ×3 IMPLANT
TOWEL GREEN STERILE (TOWEL DISPOSABLE) ×2 IMPLANT
TOWEL GREEN STERILE FF (TOWEL DISPOSABLE) ×2 IMPLANT
TRAY FOLEY MTR SLVR 16FR STAT (SET/KITS/TRAYS/PACK) ×2 IMPLANT
WATER STERILE IRR 1000ML POUR (IV SOLUTION) ×2 IMPLANT

## 2020-07-10 NOTE — Op Note (Signed)
07/10/2020  5:16 PM  PATIENT:  Antonio Colon  69 y.o. 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 male whom was taken to the operating room intubated, and placed under a general anesthetic without difficulty. A foley catheter was placed under sterile conditions. He was positioned prone on a Jackson table with all pressure points properly padded.  His lumbar region was prepped and draped in a sterile manner. I infiltrated 10cc's 1/2%lidocaine/1:2000,000 strength epinephrine into the planned incision. I opened the skin with a 10 blade and took the incision down to the thoracolumbar fascia. I exposed the lamina of L4, and 5 in a subperiosteal fashion bilaterally. I confirmed my location with an intraoperative xray.  I placed self retaining retractors and started the decompression.  We decompressed the spinal canal via an L4 laminectomy and hemilaminectomy of L5 bilaterally. I performed inferior L4 facetectomies to decompress the lateral recesses, and the foramina. I used the drill and Kerrison punches to remove the bone and the ligamentum flavum. I decorticated the lateral bone at L4 and L5. I then placed autograft morsels on the decorticated surfaces to complete the posterolateral arthrodesis.  I placed pedicle screws at L4, and 5, using  fluoroscopic guidance. I drilled a pilot hole, then cannulated the pedicle with a drill at each site. I then tapped each pedicle, assessing each site for pedicle violations. No cutouts were appreciated. Screws (Nuvasive relign) were then placed at each site without difficulty. I attached rods and locking caps with the appropriate tools. The locking caps were secured with torque limited screwdrivers. Final films were performed and the final construct appeared to be in good position.  I closed the wound in a layered fashion. I approximated the thoracolumbar fascia, subcutaneous, and subcuticular planes with vicryl sutures. I used dermabond for a sterile dressing.     PLAN OF CARE: Admit to inpatient   PATIENT DISPOSITION:  PACU - hemodynamically stable.   Delay start of Pharmacological VTE agent (>24hrs) due to surgical blood loss or risk of bleeding:  yes

## 2020-07-10 NOTE — Transfer of Care (Signed)
Immediate Anesthesia Transfer of Care Note  Patient: Antonio Colon  Procedure(s) Performed: LUMBAR FOUR-FIVE POSTERIOR LUMBAR ARTHRODESIS (N/A Spine Lumbar)  Patient Location: PACU  Anesthesia Type:General  Level of Consciousness: awake  Airway & Oxygen Therapy: Patient Spontanous Breathing  Post-op Assessment: Report given to RN and Post -op Vital signs reviewed and stable. Patient having some nausea  Post vital signs: Reviewed and stable  Last Vitals:  Vitals Value Taken Time  BP 152/91 07/10/20 1656  Temp    Pulse 92 07/10/20 1659  Resp 19 07/10/20 1659  SpO2 94 % 07/10/20 1659  Vitals shown include unvalidated device data.  Last Pain:  Vitals:   07/10/20 0652  PainSc: 4       Patients Stated Pain Goal: 3 (47/39/58 4417)  Complications: No complications documented.

## 2020-07-10 NOTE — Anesthesia Preprocedure Evaluation (Addendum)
Anesthesia Evaluation  Patient identified by MRN, date of birth, ID band Patient awake    Reviewed: Allergy & Precautions, NPO status , Patient's Chart, lab work & pertinent test results, reviewed documented beta blocker date and time   Airway Mallampati: II  TM Distance: >3 FB Neck ROM: Full    Dental no notable dental hx. (+) Teeth Intact, Missing, Dental Advisory Given,    Pulmonary sleep apnea and Continuous Positive Airway Pressure Ventilation , former smoker,  Quit smoking 2010 Compliant with CPAP    Pulmonary exam normal breath sounds clear to auscultation       Cardiovascular Exercise Tolerance: Good hypertension, Pt. on medications and Pt. on home beta blockers + CAD, + Past MI (2010), + Cardiac Stents and +CHF (grade 1 diastolic dysfunction)  Normal cardiovascular exam Rhythm:Regular Rate:Normal  Echo 2019: - Left ventricle: The cavity size was normal. Wall thickness was  increased in a pattern of moderate LVH. Definity contrast was  used due to limited images. Systolic function was normal. The  estimated ejection fraction was in the range of 55% to 60%. Wall  motion was normal; there were no regional wall motion  abnormalities. Doppler parameters are consistent with abnormal  left ventricular relaxation (grade 1 diastolic dysfunction).  - Aortic valve: Poorly visualized. Mildly calcified leaflets. There  was trivial regurgitation.  - Mitral valve: There was trivial regurgitation.  - Right ventricle: The cavity size was mildly dilated.  - Right atrium: Central venous pressure (est): 3 mm Hg.  - Atrial septum: No defect or patent foramen ovale was identified.  - Tricuspid valve: There was trivial regurgitation.  - Pulmonary arteries: Systolic pressure could not be accurately  estimated.  - Pericardium, extracardiac: A prominent pericardial fat pad was  present.   NSTEMI; s/p DES pLAD 11/08/09, only  on ASA currently  Good exercise tolerance, negative stress test 2013    Neuro/Psych negative neurological ROS  negative psych ROS   GI/Hepatic Neg liver ROS, hiatal hernia, GERD  Medicated and Controlled,  Endo/Other  Morbid obesityBMI 47  Renal/GU negative Renal ROS  negative genitourinary   Musculoskeletal Lumbar spondylolisthesis   Abdominal (+) + obese,   Peds  Hematology negative hematology ROS (+)   Anesthesia Other Findings   Reproductive/Obstetrics negative OB ROS                           Anesthesia Physical Anesthesia Plan  ASA: III  Anesthesia Plan: General   Post-op Pain Management:    Induction: Intravenous  PONV Risk Score and Plan: 2 and Ondansetron, Dexamethasone, Midazolam and Treatment may vary due to age or medical condition  Airway Management Planned: Oral ETT  Additional Equipment: None  Intra-op Plan:   Post-operative Plan: Extubation in OR  Informed Consent: I have reviewed the patients History and Physical, chart, labs and discussed the procedure including the risks, benefits and alternatives for the proposed anesthesia with the patient or authorized representative who has indicated his/her understanding and acceptance.     Dental advisory given  Plan Discussed with: CRNA  Anesthesia Plan Comments:        Anesthesia Quick Evaluation

## 2020-07-10 NOTE — H&P (Signed)
Antonio Colon returns with an Colon of the lumbar spine.  He has significant facet arthropathy on the right side at L4-5 where I did a previous decompression.  He remembers me saying that I wanted to do an arthrodesis.  He preferred not to, so we just did a laminectomy for decompression.  He is 5 feet 10 inches, weight is 327 pounds.  Temperature is 99.2, blood pressure is 165/96, pulse is 73.  Pain is 2/10.  He is alert, oriented by 4.  He answers all questions appropriately.  Memory, language, attention span, and fund of knowledge are normal.  Speech is clear.  It is also fluent.  He has a normal gait.  [name] has significant facet arthropathy at 4-5 and has listhesis 4 on 5 at that same level causing fairly severe spinal stenosis, lateral recess stenosis and foraminal stenosis bilaterally.  We discussed again the arthrodesis at 4-5 using interbody cages and pedicle screws.  He, at this point, says he just wants to go through with it.  I gave him a detailed instruction sheet going over the operation.  The biggest risks are bleeding, infection, no relief, hardware failure, fusion failure.  Other risks were addressed in the paperwork.  We will get him scheduled.  I expect a 2-3 day hospitalization.   No Known Allergies Family History  Problem Relation Age of Onset  . Heart disease Mother   . Healthy Sister    Social History   Socioeconomic History  . Marital status: Married    Spouse name: Not on file  . Number of children: Not on file  . Years of education: Not on file  . Highest education level: Not on file  Occupational History  . Not on file  Tobacco Use  . Smoking status: Former Smoker    Types: Cigarettes, Cigars    Quit date: 01/25/2009    Years since quitting: 11.4  . Smokeless tobacco: Never Used  Vaping Use  . Vaping Use: Never used  Substance and Sexual Activity  . Alcohol use: No  . Drug use: No  . Sexual activity: Not on file  Other Topics Concern  . Not on file   Social History Narrative  . Not on file   Social Determinants of Health   Financial Resource Strain:   . Difficulty of Paying Living Expenses:   Food Insecurity:   . Worried About Charity fundraiser in the Last Year:   . Arboriculturist in the Last Year:   Transportation Needs:   . Film/video editor (Medical):   Marland Kitchen Lack of Transportation (Non-Medical):   Physical Activity:   . Days of Exercise per Week:   . Minutes of Exercise per Session:   Stress:   . Feeling of Stress :   Social Connections:   . Frequency of Communication with Friends and Family:   . Frequency of Social Gatherings with Friends and Family:   . Attends Religious Services:   . Active Member of Clubs or Organizations:   . Attends Archivist Meetings:   Marland Kitchen Marital Status:   Intimate Partner Violence:   . Fear of Current or Ex-Partner:   . Emotionally Abused:   Marland Kitchen Physically Abused:   . Sexually Abused:    Prior to Admission medications   Medication Sig Start Date End Date Taking? Authorizing Provider  ALPRAZolam (XANAX) 0.25 MG tablet Take 0.25 mg by mouth at bedtime.   Yes [provider]  aspirin EC 81 MG tablet Take 1 tablet (81 mg total) by mouth daily. 01/10/19  Yes Rehman, Mechele Dawley, MD  BYSTOLIC 5 MG tablet TAKE 1 TABLET BY MOUTH DAILY Patient taking differently: Take 5 mg by mouth daily.  12/14/19  Yes Lorretta Harp, MD  HYDROcodone-acetaminophen (NORCO/VICODIN) 5-325 MG tablet Take 1 tablet by mouth 2 (two) times daily as needed for moderate pain.   Yes [provider]  losartan-hydrochlorothiazide (HYZAAR) 100-12.5 MG tablet Take 1 tablet by mouth daily. 06/17/20  Yes [provider]  Menthol, Topical Analgesic, (BIOFREEZE EX) Apply 1 application topically daily as needed (back pain).   Yes [provider]  Multiple Vitamin (MULTIVITAMIN WITH MINERALS) TABS tablet Take 1 tablet by mouth daily.   Yes [provider]  MYRBETRIQ 50 MG TB24  tablet Take 50 mg by mouth daily.  05/27/19  Yes [provider]  nitroGLYCERIN (NITROSTAT) 0.4 MG SL tablet Place 1 tablet (0.4 mg total) under the tongue every 5 (five) minutes as needed. For chest pain Patient taking differently: Place 0.4 mg under the tongue every 5 (five) minutes as needed for chest pain.  07/05/13  Yes Lorretta Harp, MD  rosuvastatin (CRESTOR) 5 MG tablet TAKE 1 TABLET BY MOUTH DAILY AT 6PM FOR CHOLESTEROL. Patient taking differently: Take 5 mg by mouth every evening.  10/09/19  Yes Lorretta Harp, MD   Admit for operative decompression and arthrodesis at L4/5.

## 2020-07-10 NOTE — Anesthesia Postprocedure Evaluation (Signed)
Anesthesia Post Note  Patient: Antonio Colon  Procedure(s) Performed: LUMBAR FOUR-FIVE POSTERIOR LUMBAR ARTHRODESIS (N/A Spine Lumbar)     Patient location during evaluation: PACU Anesthesia Type: General Level of consciousness: awake and alert, oriented and patient cooperative Pain management: pain level controlled Vital Signs Assessment: post-procedure vital signs reviewed and stable Respiratory status: spontaneous breathing, nonlabored ventilation and respiratory function stable Cardiovascular status: blood pressure returned to baseline and stable Postop Assessment: no apparent nausea or vomiting Anesthetic complications: no   No complications documented.  Last Vitals:  Vitals:   07/10/20 1656 07/10/20 1700  BP: (!) 152/91 (!) (P) 152/91  Pulse: (!) 105 88  Resp: 19 (!) 21  Temp:  36.7 C  SpO2: 99% 93%    Last Pain:  Vitals:   07/10/20 7915  PainSc: [place]

## 2020-07-10 NOTE — Anesthesia Procedure Notes (Signed)
Procedure Name: Intubation Date/Time: 07/10/2020 10:56 AM Performed by: Oletta Lamas, CRNA Pre-anesthesia Checklist: Patient identified, Emergency Drugs available, Suction available and Patient being monitored Patient Re-evaluated:Patient Re-evaluated prior to induction Oxygen Delivery Method: Circle System Utilized Preoxygenation: Pre-oxygenation with 100% oxygen Induction Type: IV induction Ventilation: Mask ventilation without difficulty Laryngoscope Size: Miller and 3 Grade View: Grade I Tube type: Oral Tube size: 8.0 mm Number of attempts: 1 Airway Equipment and Method: Stylet Placement Confirmation: ETT inserted through vocal cords under direct vision,  positive ETCO2 and breath sounds checked- equal and bilateral Secured at: 23 cm Tube secured with: Tape Dental Injury: Teeth and Oropharynx as per pre-operative assessment

## 2020-07-11 LAB — URINE CULTURE: Culture: NO GROWTH

## 2020-07-11 MED ORDER — OXYCODONE HCL 5 MG PO TABS: 5.0000 mg | ORAL_TABLET | Freq: Four times a day (QID) | ORAL | 0 refills | Status: DC | PRN

## 2020-07-11 MED ORDER — TIZANIDINE HCL 4 MG PO TABS
4.0000 mg | ORAL_TABLET | Freq: Four times a day (QID) | ORAL | 0 refills | Status: DC | PRN
Start: 2020-07-11 — End: 2020-12-17

## 2020-07-11 NOTE — Progress Notes (Signed)
Pt arrived to the unit at 2030. Pt is oriented x4. VS stable. Foley intact. Drainage on dressing marked. Pt oriented to unit. Call bell in reach. Will continue to monitor.

## 2020-07-11 NOTE — Progress Notes (Signed)
Patient ID: Antonio Colon, male   DOB: 01/10/1951, 69 y.o.   MRN: [id number] BP (!) 134/54 (BP Location: Right Wrist)   Pulse 74   Temp 98.7 F (37.1 C) (Oral)   Resp 18   Ht 5\' 10"  (1.778 m)   Wt (!) 148.3 kg   SpO2 98%   BMI 46.92 kg/m  Alert and oriented x 4 Moving well Wound is clean, and dry Discharge tomorrow

## 2020-07-11 NOTE — Evaluation (Signed)
Occupational Therapy Evaluation Patient Details Name: Antonio Colon MRN: [id number] DOB: [birth year] Today's Date: 07/11/2020    History of Present Illness 69 y.o.male presenting with low back pain. Pt with significant facet arthropathy on R side at L4-5. Pt underwent L4-5 posterior arthrodesis on 7/14. PMH significant for HTN, CAD, HLD, GERD, hiatal hernia, MI.   Clinical Impression   Pt admitted with above. He demonstrates the below listed deficits and will benefit from continued OT to maximize safety and independence with BADLs.  Pt instructed in back precautions and demonstrates good understanding of back precautions.  He may benefit from AE for toileting and bathing peri area.  He lives with his wife and was fully independent PTA, including working as a Designer, jewellery.  Will follow acutely.       Follow Up Recommendations  No OT follow up;Supervision - Intermittent    Equipment Recommendations  3 in 1 bedside commode    Recommendations for Other Services       Precautions / Restrictions Precautions Precautions: Back Precaution Booklet Issued: Yes (comment) Precaution Comments: reviewed Back precautions with him       Mobility Bed Mobility Overal bed mobility: Needs Assistance Bed Mobility: Rolling;Sidelying to Sit Rolling: Supervision Sidelying to sit: Min guard       General bed mobility comments: pt instructed in log rolling technique.  Relies heavily on bedrails   Transfers Overall transfer level: Needs assistance Equipment used: Rolling walker (2 wheeled) Transfers: Sit to/from [facility] Sit to Stand: Min guard Stand pivot transfers: Min guard       General transfer comment: min guard for safety     Balance Overall balance assessment: Mild deficits observed, not formally tested                                         ADL either performed or assessed with clinical judgement   ADL Overall ADL's : Needs  assistance/impaired Eating/Feeding: Independent   Grooming: Wash/dry hands;Oral care;Wash/dry face;Brushing hair;Min guard;Standing Grooming Details (indicate cue type and reason): reviewed proper technique for oral care and shaving  Upper Body Bathing: Supervision/ safety;Sitting   Lower Body Bathing: Minimal assistance;Sit to/from stand Lower Body Bathing Details (indicate cue type and reason): able to perform figure 4.  discussed use of LH sponge.  Will likely require AE to access peri area  Upper Body Dressing : Supervision/safety;Sitting   Lower Body Dressing: Min guard;Sit to/from stand Lower Body Dressing Details (indicate cue type and reason): able to perform figure 4  Toilet Transfer: Min guard;Ambulation;Comfort height toilet;Grab bars;RW   Toileting- Clothing Manipulation and Hygiene: Moderate assistance;Sit to/from stand Toileting - Clothing Manipulation Details (indicate cue type and reason): will likely require AE to access peri area      Functional mobility during ADLs: Min guard;Rolling walker General ADL Comments: Pt demonstrates good compliance with back precautions      Vision         Perception     Praxis      Pertinent Vitals/Pain Pain Assessment: 0-10 Pain Score: 4  Pain Location: back  Pain Descriptors / Indicators: Discomfort Pain Intervention(s): Monitored during session     Hand Dominance Right   Extremity/Trunk Assessment Upper Extremity Assessment Upper Extremity Assessment: Overall WFL for tasks assessed   Lower Extremity Assessment Lower Extremity Assessment: Defer to PT evaluation   Cervical / Trunk Assessment Cervical /  Trunk Assessment: Other exceptions   Communication Communication Communication: No difficulties   Cognition Arousal/Alertness: Awake/alert Behavior During Therapy: WFL for tasks assessed/performed Overall Cognitive Status: Within Functional Limits for tasks assessed                                      General Comments       Exercises     Shoulder Instructions      Home Living Family/patient expects to be discharged to:: Private residence Living Arrangements: Spouse/significant other Available Help at Discharge: Family;Available 24 hours/day Type of Home: House Home Access: Level entry     Home Layout: Two level;Able to live on main level with bedroom/bathroom Alternate Level Stairs-Number of Steps: full flight    Bathroom Shower/Tub: Hospital doctor Toilet: Handicapped height Bathroom Accessibility: Yes   Home Equipment: Environmental consultant - 2 wheels;Adaptive equipment;Grab bars - tub/shower;Cane - single point Adaptive Equipment: Reacher;Long-handled sponge        Prior Functioning/Environment Level of Independence: Independent        Comments: 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 [place]          OT Problem List: Decreased safety awareness;Decreased knowledge of use of DME or AE;Pain;Obesity;Decreased knowledge of precautions      OT Treatment/Interventions: Self-care/ADL training;DME and/or AE instruction;Therapeutic activities;Patient/family education;Balance training    OT Goals(Current goals can be found in the care plan section) Acute Rehab OT Goals Patient Stated Goal: to be able to go back to work  OT Goal Formulation: With patient Time For Goal Achievement: 07/25/20 Potential to Achieve Goals: Good ADL Goals Pt Will Perform Grooming: with supervision;standing Pt Will Perform Lower Body Bathing: with supervision;sit to/from stand;with adaptive equipment Pt Will Perform Lower Body Dressing: with supervision;with adaptive equipment;sit to/from stand Pt Will Transfer to Toilet: with supervision;ambulating;regular height toilet;bedside commode;grab bars Pt Will Perform Toileting - Clothing Manipulation and hygiene: with supervision;with adaptive equipment;sit to/from stand Pt Will Perform Tub/Shower Transfer: Shower  transfer;ambulating;rolling walker  OT Frequency: Min 2X/week   Barriers to D/C:            Co-evaluation              AM-PAC OT "6 Clicks" Daily Activity     Outcome Measure Help from another person eating meals?: None Help from another person taking care of personal grooming?: A Little Help from another person toileting, which includes using toliet, bedpan, or urinal?: A Little Help from another person bathing (including washing, rinsing, drying)?: A Little Help from another person to put on and taking off regular upper body clothing?: A Little Help from another person to put on and taking off regular lower body clothing?: A Little 6 Click Score: 19   End of Session Equipment Utilized During Treatment: Rolling walker;Gait belt Nurse Communication: Mobility status  Activity Tolerance: Patient tolerated treatment well Patient left: in chair;with call bell/phone within reach;with chair alarm set  OT Visit Diagnosis: Pain;Unsteadiness on feet (R26.81) Pain - part of body:  (back )                Time: 3810-1751 OT Time Calculation (min): 19 min Charges:  OT General Charges $OT Visit: 1 Visit OT Evaluation $OT Eval Moderate Complexity: 1 Mod  Nilsa Nutting., OTR/L Acute Rehabilitation Services Pager [phone] Office [phone]   Lucille Passy M 07/11/2020, 9:01 AM

## 2020-07-11 NOTE — Discharge Instructions (Signed)

## 2020-07-11 NOTE — Discharge Summary (Signed)
Physician Discharge Summary  Patient ID: Antonio Colon MRN: [id number] DOB/AGE: 69-Mar-1952 69 y.o.  Admit date: 07/10/2020 Discharge date: 07/11/2020  Admission Diagnoses:lumbar spondylolisthesis L4/5  Discharge Diagnoses: Antonio Colon Active Problems:   Spondylolisthesis of lumbar region   Discharged Condition: good  Hospital Course: Antonio Colon was admitted and taken to the operating room for an uncomplicated lumbar decompression and posterolateral arthrodesis at L4/5. No interbody placed secondary to the height of his disc space. Post op he is doing well, ambulating, voiding, and tolerating a regular diet.  His wound is clean, dry, without signs of infection.   Treatments: surgery: LUMBAR FOUR-FIVE POSTERIOR LUMBAR ARTHRODESIS L4 laminectomy and inferior facetectomies , superior partial facetectomies L5 Non segmental pedicle screw fixation NuvAsive relign screws  Discharge Exam: Blood pressure (!) 134/54, pulse 74, temperature 98.7 F (37.1 C), temperature source Oral, resp. rate 18, height 5\' 10"  (1.778 m), weight (!) 148.3 kg, SpO2 98 %. General appearance: alert, cooperative, appears stated age and no distress  Disposition: Discharge disposition: 01-Home or Self Care      SPONDYLOLISTHESIS OF LUMBAR REGION  Allergies as of 07/11/2020   No Known Allergies     Medication List    STOP taking these medications   HYDROcodone-acetaminophen 5-325 MG tablet Commonly known as: NORCO/VICODIN     TAKE these medications   ALPRAZolam 0.25 MG tablet Commonly known as: XANAX Take 0.25 mg by mouth at bedtime.   aspirin EC 81 MG tablet Take 1 tablet (81 mg total) by mouth daily.   BIOFREEZE EX Apply 1 application topically daily as needed (back pain).   Bystolic 5 MG tablet Generic drug: nebivolol TAKE 1 TABLET BY MOUTH DAILY What changed: how much to take   losartan-hydrochlorothiazide 100-12.5 MG tablet Commonly known as: HYZAAR Take 1 tablet by mouth daily.   multivitamin  with minerals Tabs tablet Take 1 tablet by mouth daily.   Myrbetriq 50 MG Tb24 tablet Generic drug: mirabegron ER Take 50 mg by mouth daily.   nitroGLYCERIN 0.4 MG SL tablet Commonly known as: NITROSTAT Place 1 tablet (0.4 mg total) under the tongue every 5 (five) minutes as needed. For chest pain What changed:   reasons to take this  additional instructions   oxyCODONE 5 MG immediate release tablet Commonly known as: Oxy IR/ROXICODONE Take 1 tablet (5 mg total) by mouth every 6 (six) hours as needed for moderate pain ((score 4 to 6)).   rosuvastatin 5 MG tablet Commonly known as: CRESTOR TAKE 1 TABLET BY MOUTH DAILY AT 6PM FOR CHOLESTEROL. What changed: See the new instructions.   tiZANidine 4 MG tablet Commonly known as: ZANAFLEX Take 1 tablet (4 mg total) by mouth every 6 (six) hours as needed for muscle spasms.            Durable Medical Equipment  (From admission, onward)         Start     Ordered   07/11/20 1350  For home use only DME 3 n 1  Once        07/11/20 1349           Signed: Ashok Pall 07/11/2020, 5:59 PM

## 2020-07-11 NOTE — TOC Initial Note (Signed)
Transition of Care [facility]) - Initial/Assessment Note    Patient Details  Name: Antonio Colon MRN: [id number] Date of Birth: 04-Jan-1951  [facility]) CM/SW Contact:    Verdell Carmine, RN Phone Number: 07/11/2020, 1:51 PM  Clinical Narrative:                 Admitted for lumbar surgery. PT and OT evaluation reveal that patient could use 3:1 for home use. Ordered through [employer] to be brought to room.   Expected Discharge Plan: Home/Self Care Barriers to Discharge: No Barriers Identified   Patient Goals and CMS Choice Patient states their goals for this hospitalization and ongoing recovery are:: Go home with  equipment      Expected Discharge Plan and Services Expected Discharge Plan: Home/Self Care                         DME Arranged: 3-N-1 DME Agency: [employer] Date DME Agency Contacted: 07/11/20 Time DME Agency Contacted: 1350 Representative spoke with at DME Agency: [place] Arrangements/Services     Patient language and need for interpreter reviewed:: Yes        Need for Family Participation in Patient Care: Yes (Comment) Care giver support system in place?: Yes (comment)   Criminal Activity/Legal Involvement Pertinent to Current Situation/Hospitalization: No - Comment as needed  Activities of Daily Living      Permission Sought/Granted                  Emotional Assessment       Orientation: : Oriented to Place, Oriented to Self, Oriented to Situation, Oriented to  Time Alcohol / Substance Use: Not Applicable    Admission diagnosis:  Spondylolisthesis of lumbar region [M43.16] Patient Active Problem List   Diagnosis Date Noted  . Spondylolisthesis of lumbar region 07/10/2020  . Positive colorectal cancer screening using Cologuard test 12/23/2018  . Obstructive sleep apnea 08/26/2018  . Respiratory failure ([place]) 06/01/2018  . Morbid obesity ([place]) 06/01/2018  . NAFLD (nonalcoholic fatty liver  disease) 01/26/2012  . GERD (gastroesophageal reflux disease) 01/26/2012  . CAD S/P percutaneous coronary angioplasty 01/26/2012  . Hyperlipemia 01/26/2012  . HTN (hypertension) 01/26/2012  . FRACTURE, MEDIAL MALLEOLUS 11/20/2008  . ANKLE SPRAIN, LEFT 11/20/2008   PCP:  Sinda Du, MD Pharmacy:   [facility] [place] [address] Phone: [phone] Fax: [phone]     Social Determinants of Health (SDOH) Interventions    Readmission Risk Interventions No flowsheet data found.

## 2020-07-11 NOTE — Evaluation (Signed)
Physical Therapy Evaluation Patient Details Name: Antonio Colon MRN: [id number] DOB: February 02, 1951 Today's Date: 07/11/2020   History of Present Illness  69 y.o.male presenting with low back pain. Pt with significant facet arthropathy on R side at L4-5. Pt underwent L4-5 posterior arthrodesis on 7/14. PMH significant for HTN, CAD, HLD, GERD, hiatal hernia, MI.  Clinical Impression  Pt presents to PT with deficits in gait, power, strength, endurance, balance, and functional mobility. Pt currently does not require physical assistance to transfer or to ambulate, utilizing RW for UE support. Pt does report muscular fatigue which limits activity tolerance at this time. Pt will benefit from continued aggressive mobilization and PT POC to improve activity tolerance and to restore independence in mobility. PT currently recommends outpatient PT at the time of discharge, no DME needs.    Follow Up Recommendations Outpatient PT;Supervision - Intermittent    Equipment Recommendations  None recommended by PT (pt owns necessary DME)    Recommendations for Other Services       Precautions / Restrictions Precautions Precautions: Back Precaution Booklet Issued: No (OT provided) Precaution Comments: verbally reviewed back precautions, pt able to recall 3/3 Restrictions Weight Bearing Restrictions: No      Mobility  Bed Mobility Overal bed mobility: Needs Assistance Bed Mobility: Rolling;Sidelying to Sit Rolling: Supervision Sidelying to sit: Min guard       General bed mobility comments: pt received and left in recliner  Transfers Overall transfer level: Needs assistance Equipment used: Rolling walker (2 wheeled) Transfers: Sit to/from Stand Sit to Stand: Supervision Stand pivot transfers: Min guard       General transfer comment: min guard for safety   Ambulation/Gait Ambulation/Gait assistance: Supervision Gait Distance (Feet): 150 Feet Assistive device: Rolling walker (2  wheeled) Gait Pattern/deviations: Step-through pattern Gait velocity: reduced Gait velocity interpretation: <1.8 ft/sec, indicate of risk for recurrent falls General Gait Details: pt with slowed step through gait with reduced stride length  Stairs            Wheelchair Mobility    Modified Rankin (Stroke Patients Only)       Balance Overall balance assessment: Mild deficits observed, not formally tested                                           Pertinent Vitals/Pain Pain Assessment: 0-10 Pain Score: 4  Pain Location: back Pain Descriptors / Indicators: Discomfort Pain Intervention(s): Monitored during session    Home Living Family/patient expects to be discharged to:: Private residence Living Arrangements: Spouse/significant other Available Help at Discharge: Family;Available 24 hours/day Type of Home: House Home Access: Level entry     Home Layout: Two level;Able to live on main level with bedroom/bathroom Home Equipment: Gilford Rile - 2 wheels;Adaptive equipment;Grab bars - tub/shower;Cane - single point      Prior Function Level of Independence: Independent         Comments: 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 [place]: Right    Extremity/Trunk Assessment   Upper Extremity Assessment Upper Extremity Assessment: Overall WFL for tasks assessed    Lower Extremity Assessment Lower Extremity Assessment: Generalized weakness    Cervical / Trunk Assessment Cervical / Trunk Assessment: Other exceptions Cervical / Trunk Exceptions: morbid obesity  Communication   Communication: No difficulties  Cognition  Arousal/Alertness: Awake/alert Behavior During Therapy: WFL for tasks assessed/performed Overall Cognitive Status: Within Functional Limits for tasks assessed                                        General Comments General comments (skin integrity,  edema, etc.): VSS on RA    Exercises     Assessment/Plan    PT Assessment Patient needs continued PT services  PT Problem List Decreased strength;Decreased activity tolerance;Decreased balance;Decreased mobility;Decreased knowledge of use of DME;Decreased knowledge of precautions;Pain       PT Treatment Interventions DME instruction;Stair training;Gait training;Functional mobility training;Therapeutic activities;Therapeutic exercise;Balance training;Neuromuscular re-education;Patient/family education    PT Goals (Current goals can be found in the Care Plan section)  Acute Rehab PT Goals Patient Stated Goal: To get back to work PT Goal Formulation: With patient Time For Goal Achievement: 07/25/20 Potential to Achieve Goals: Good    Frequency Min 5X/week   Barriers to discharge        Co-evaluation               AM-PAC PT "6 Clicks" Mobility  Outcome Measure Help needed turning from your back to your side while in a flat bed without using bedrails?: A Little Help needed moving from lying on your back to sitting on the side of a flat bed without using bedrails?: A Little Help needed moving to and from a bed to a chair (including a wheelchair)?: None Help needed standing up from a chair using your arms (e.g., wheelchair or bedside chair)?: None Help needed to walk in hospital room?: None Help needed climbing 3-5 steps with a railing? : A Lot 6 Click Score: 20    End of Session Equipment Utilized During Treatment: Gait belt Activity Tolerance: Patient tolerated treatment well Patient left: in chair;with call bell/phone within reach Nurse Communication: Mobility status PT Visit Diagnosis: Other abnormalities of gait and mobility (R26.89)    Time: 1962-2297 PT Time Calculation (min) (ACUTE ONLY): 11 min   Charges:   PT Evaluation $PT Eval Low Complexity: [place], PT, DPT Acute Rehabilitation Pager: [phone]   Zenaida Niece 07/11/2020,  9:33 AM

## 2020-07-12 NOTE — Progress Notes (Signed)
Physical Therapy Treatment Patient Details Name: Antonio Colon MRN: [id number] DOB: Dec 05, 1951 Today's Date: 07/12/2020    History of Present Illness 69 y.o.male presenting with low back pain. Pt with significant facet arthropathy on R side at L4-5. Pt underwent L4-5 posterior arthrodesis on 7/14. PMH significant for HTN, CAD, HLD, GERD, hiatal hernia, MI.    PT Comments    Pt tolerates treatment well this session, attempting ambulation with cane but returning to walker due to increased comfort and more normalized gait pattern this session. Pt also progresses to limited stair negotiation at this time with verbal cues for technique to improve safety and to reduce falls risk. PT also provides cues for bed mobility technique to reduce twisting, however pt reports he will likely sleep in a recliner at home to reduce the need to mobilize in bed. PT continues to recommend outpatient PT at the time of discharge to continue progression of gait quality, strength, and activity tolerance.  Follow Up Recommendations  Outpatient PT;Supervision - Intermittent     Equipment Recommendations  None recommended by PT    Recommendations for Other Services       Precautions / Restrictions Precautions Precautions: Back Precaution Booklet Issued: No Precaution Comments: verbally reviewed back precautions, pt able to recall 3/3 Restrictions Weight Bearing Restrictions: No    Mobility  Bed Mobility Overal bed mobility: Needs Assistance Bed Mobility: Rolling;Sidelying to Sit;Sit to Sidelying Rolling: Supervision Sidelying to sit: Supervision     Sit to sidelying: Min guard General bed mobility comments: verbal cues to log roll  Transfers Overall transfer level: Needs assistance Equipment used: Straight cane Transfers: Sit to/from Stand Sit to Stand: Supervision            Ambulation/Gait Ambulation/Gait assistance: Supervision Gait Distance (Feet): 200 Feet Assistive device: Rolling walker  (2 wheeled);Straight cane Gait Pattern/deviations: Step-through pattern Gait velocity: reduced Gait velocity interpretation: 1.31 - 2.62 ft/sec, indicative of limited community ambulator General Gait Details: pt with short and antalgic step to gait with use of cane, transition back to RW with more regular and steady step through pattern   Stairs Stairs: Yes Stairs assistance: Min guard Stair Management: Sideways Number of Stairs: 3     Wheelchair Mobility    Modified Rankin (Stroke Patients Only)       Balance Overall balance assessment: Mild deficits observed, not formally tested                                          Cognition Arousal/Alertness: Awake/alert Behavior During Therapy: WFL for tasks assessed/performed Overall Cognitive Status: Within Functional Limits for tasks assessed                                        Exercises      General Comments General comments (skin integrity, edema, etc.): VSS on RA      Pertinent Vitals/Pain Pain Assessment: Faces Faces Pain Scale: Hurts little more Pain Location: IV site Pain Descriptors / Indicators: Grimacing Pain Intervention(s): Monitored during session    Home Living                      Prior Function            PT Goals (current goals can now be found in  the care plan section) Acute Rehab PT Goals Patient Stated Goal: To get back to work Progress towards PT goals: Progressing toward goals    Frequency    Min 5X/week      PT Plan Current plan remains appropriate    Co-evaluation              AM-PAC PT "6 Clicks" Mobility   Outcome Measure  Help needed turning from your back to your side while in a flat bed without using bedrails?: None Help needed moving from lying on your back to sitting on the side of a flat bed without using bedrails?: None Help needed moving to and from a bed to a chair (including a wheelchair)?: None Help needed  standing up from a chair using your arms (e.g., wheelchair or bedside chair)?: None Help needed to walk in hospital room?: None Help needed climbing 3-5 steps with a railing? : A Little 6 Click Score: 23    End of Session   Activity Tolerance: Patient tolerated treatment well Patient left: in bed;with call bell/phone within reach Nurse Communication: Mobility status PT Visit Diagnosis: Other abnormalities of gait and mobility (R26.89)     Time: 1660-6004 PT Time Calculation (min) (ACUTE ONLY): 16 min  Charges:  $Gait Training: 8-22 mins                     Antonio Colon, PT, DPT Acute Rehabilitation Pager: [phone]    Antonio Colon 07/12/2020, 8:39 AM

## 2020-07-12 NOTE — Plan of Care (Signed)

## 2020-08-01 DIAGNOSIS — M4316 Spondylolisthesis, lumbar region: Secondary | ICD-10-CM | POA: Diagnosis not present

## 2020-08-21 DIAGNOSIS — E785 Hyperlipidemia, unspecified: Secondary | ICD-10-CM | POA: Diagnosis not present

## 2020-08-21 DIAGNOSIS — I10 Essential (primary) hypertension: Secondary | ICD-10-CM | POA: Diagnosis not present

## 2020-08-27 DIAGNOSIS — E785 Hyperlipidemia, unspecified: Secondary | ICD-10-CM | POA: Diagnosis not present

## 2020-08-27 DIAGNOSIS — I10 Essential (primary) hypertension: Secondary | ICD-10-CM | POA: Diagnosis not present

## 2020-08-27 DIAGNOSIS — G4700 Insomnia, unspecified: Secondary | ICD-10-CM | POA: Diagnosis not present

## 2020-08-27 DIAGNOSIS — I25119 Atherosclerotic heart disease of native coronary artery with unspecified angina pectoris: Secondary | ICD-10-CM | POA: Diagnosis not present

## 2020-10-08 ENCOUNTER — Other Ambulatory Visit: Payer: Self-pay | Admitting: Cardiovascular Disease

## 2020-10-09 NOTE — Telephone Encounter (Signed)
Rx request sent to pharmacy.  

## 2020-10-11 DIAGNOSIS — Z23 Encounter for immunization: Secondary | ICD-10-CM | POA: Diagnosis not present

## 2020-10-12 ENCOUNTER — Other Ambulatory Visit: Payer: Self-pay | Admitting: Cardiovascular Disease

## 2020-12-09 ENCOUNTER — Other Ambulatory Visit: Payer: Self-pay

## 2020-12-09 ENCOUNTER — Telehealth: Payer: Self-pay | Admitting: Cardiovascular Disease

## 2020-12-09 MED ORDER — NEBIVOLOL HCL 5 MG PO TABS: 5.0000 mg | ORAL_TABLET | Freq: Every day | ORAL | 2 refills | Status: DC

## 2020-12-09 NOTE — Telephone Encounter (Signed)
*  STAT* If patient is at the pharmacy, call can be transferred to refill team.   1. Which medications need to be refilled? (please list name of each medication and dose if known) nebivolol (BYSTOLIC) 5 MG tablet  2. Which pharmacy/location (including street and city if local pharmacy) is medication to be sent to? [place], [address]  3. Do they need a 30 day or 90 day supply? 30 day   Patient is out of medication. He has an appointment 12/17/2020 with Kerin Ransom.

## 2020-12-09 NOTE — Telephone Encounter (Signed)
We also have samples if he wants some

## 2020-12-11 DIAGNOSIS — E785 Hyperlipidemia, unspecified: Secondary | ICD-10-CM | POA: Diagnosis not present

## 2020-12-11 DIAGNOSIS — Z0001 Encounter for general adult medical examination with abnormal findings: Secondary | ICD-10-CM | POA: Diagnosis not present

## 2020-12-11 DIAGNOSIS — H903 Sensorineural hearing loss, bilateral: Secondary | ICD-10-CM | POA: Diagnosis not present

## 2020-12-11 DIAGNOSIS — I25119 Atherosclerotic heart disease of native coronary artery with unspecified angina pectoris: Secondary | ICD-10-CM | POA: Diagnosis not present

## 2020-12-11 DIAGNOSIS — Z23 Encounter for immunization: Secondary | ICD-10-CM | POA: Diagnosis not present

## 2020-12-11 DIAGNOSIS — I10 Essential (primary) hypertension: Secondary | ICD-10-CM | POA: Diagnosis not present

## 2020-12-16 DIAGNOSIS — E785 Hyperlipidemia, unspecified: Secondary | ICD-10-CM | POA: Diagnosis not present

## 2020-12-16 DIAGNOSIS — G4700 Insomnia, unspecified: Secondary | ICD-10-CM | POA: Diagnosis not present

## 2020-12-16 DIAGNOSIS — I25119 Atherosclerotic heart disease of native coronary artery with unspecified angina pectoris: Secondary | ICD-10-CM | POA: Diagnosis not present

## 2020-12-16 DIAGNOSIS — I10 Essential (primary) hypertension: Secondary | ICD-10-CM | POA: Diagnosis not present

## 2020-12-17 ENCOUNTER — Other Ambulatory Visit: Payer: Self-pay

## 2020-12-17 ENCOUNTER — Ambulatory Visit: Payer: Medicare Other | Admitting: Cardiology

## 2020-12-17 ENCOUNTER — Encounter: Payer: Self-pay | Admitting: Cardiology

## 2020-12-17 VITALS — BP 142/77 | HR 69 | Ht 69.5 in | Wt 325.0 lb

## 2020-12-17 DIAGNOSIS — I10 Essential (primary) hypertension: Secondary | ICD-10-CM

## 2020-12-17 DIAGNOSIS — E782 Mixed hyperlipidemia: Secondary | ICD-10-CM

## 2020-12-17 DIAGNOSIS — G4733 Obstructive sleep apnea (adult) (pediatric): Secondary | ICD-10-CM

## 2020-12-17 DIAGNOSIS — Z9861 Coronary angioplasty status: Secondary | ICD-10-CM | POA: Diagnosis not present

## 2020-12-17 DIAGNOSIS — I2510 Atherosclerotic heart disease of native coronary artery without angina pectoris: Secondary | ICD-10-CM

## 2020-12-17 MED ORDER — NEBIVOLOL HCL 5 MG PO TABS: 5.0000 mg | ORAL_TABLET | Freq: Every day | ORAL | 3 refills | Status: DC

## 2020-12-17 NOTE — Patient Instructions (Signed)

## 2020-12-17 NOTE — Assessment & Plan Note (Signed)
Compliant with C-pap 

## 2020-12-17 NOTE — Assessment & Plan Note (Signed)
Followed by PCP

## 2020-12-17 NOTE — Assessment & Plan Note (Addendum)
LAD PCI with DES 2010, Myoview low risk 2013. No history of recent angina

## 2020-12-17 NOTE — Assessment & Plan Note (Signed)
BMI 47. I asked him to speak with his PCP about the possibility of starting medication for this

## 2020-12-17 NOTE — Progress Notes (Signed)
Cardiology Office Note:    Date:  12/17/2020   ID:  Antonio Colon, DOB [birth year], MRN [id number]  PCP:  Celene Squibb, MD  Cardiologist:  No primary care provider on file.  Electrophysiologist:  None   Referring MD: Sinda Du, MD   No chief complaint on file.   History of Present Illness:    Antonio Colon is a 69 y.o. male with a hx of coronary disease status post LAD PCI in the setting of an MI in 2010.  He received a DES then.  Dr. Kennon Holter notes indicate he had no other significant coronary disease.  He tells me he had pretty classic symptoms then, he woke up with chest pain described as an elephant sitting on his chest.  He admitted to a couple days of dyspnea on exertion prior to his presentation.  Myoview in 2013 was low risk.  Echocardiogram in May 2019 showed normal LV function with moderate LVH.  Other medical issues include treated hypertension, treated dyslipidemia, morbid obesity with a BMI of 47, and obstructive sleep apnea on CPAP.  He is in the office today for routine annual checkup.  His labs are followed by his primary care provider, the patient tells me these have been excellent.  Patient underwent back surgery in July 2021 with good results.  He denies any problems with chest pain or symptoms similar to his pre-PCI symptoms.  He has been trying to go to the gym to exercise but he has a hard time doing this with wearing a mask.  Past Medical History:  Diagnosis Date  . CAD (coronary artery disease)    stents  . GERD (gastroesophageal reflux disease)   . Heart disease   . Heart murmur    childhood  . Hiatal hernia   . History of stress test 01/27/2012   Normal study. No significant ischemia demonstration, this low risk scan, no significant change compared to previous study.  . Hypercholesteremia   . Hyperlipidemia   . Hypertension   . Myocardial infarction ([place]) 2010  . Obesity   . Pneumonia 2019  . Sleep apnea    cpap at night    Past Surgical History:   Procedure Laterality Date  . BACK SURGERY  10/2019   Dr. Christella Noa: right L4-5 laminectomy  . CARDIAC CATHETERIZATION  10/2009   CAD stents to the LAD using an Endeavor drug eluting (3x67mm) by Dr Rex Kras, he had normal circumflex and RCA at the time.  . COLONOSCOPY  06/15/2012  . COLONOSCOPY N/A 01/09/2019   Procedure: COLONOSCOPY;  Surgeon: Rogene Houston, MD;  Location: [facility];  Service: Endoscopy;  Laterality: N/A;  2:10pm  . ESOPHAGOGASTRODUODENOSCOPY N/A 11/15/2015   Procedure: ESOPHAGOGASTRODUODENOSCOPY (EGD);  Surgeon: Rogene Houston, MD;  Location: [facility];  Service: Endoscopy;  Laterality: N/A;  1:10 - moved to 12:55 - Ann notified pt  . POLYPECTOMY  01/09/2019   Procedure: POLYPECTOMY;  Surgeon: Rogene Houston, MD;  Location: [facility];  Service: Endoscopy;;  colon  . TONSILLECTOMY      Current Medications: Current Meds  Medication Sig  . ALPRAZolam (XANAX) 0.25 MG tablet Take 0.25 mg by mouth at bedtime.  Marland Kitchen aspirin EC 81 MG tablet Take 1 tablet (81 mg total) by mouth daily.  Marland Kitchen losartan-hydrochlorothiazide (HYZAAR) 100-12.5 MG tablet Take 1 tablet by mouth daily.  . Menthol, Topical Analgesic, (BIOFREEZE EX) Apply 1 application topically daily as needed (back pain).  . Multiple Vitamin (MULTIVITAMIN WITH  MINERALS) TABS tablet Take 1 tablet by mouth daily.  Marland Kitchen MYRBETRIQ 50 MG TB24 tablet Take 50 mg by mouth daily.   . nitroGLYCERIN (NITROSTAT) 0.4 MG SL tablet Place 1 tablet (0.4 mg total) under the tongue every 5 (five) minutes as needed. For chest pain (Patient taking differently: Place 0.4 mg under the tongue every 5 (five) minutes as needed for chest pain.)  . rosuvastatin (CRESTOR) 5 MG tablet TAKE 1 TABLET BY MOUTH DAILY AT 6PM FOR CHOLESTEROL.  . [DISCONTINUED] nebivolol (BYSTOLIC) 5 MG tablet Take 1 tablet (5 mg total) by mouth daily. Please schedule appointment with Dr. Gwenlyn Found for refills.  . [DISCONTINUED] oxyCODONE (OXY IR/ROXICODONE) 5 MG immediate  release tablet Take 1 tablet (5 mg total) by mouth every 6 (six) hours as needed for moderate pain ((score 4 to 6)).  . [DISCONTINUED] tiZANidine (ZANAFLEX) 4 MG tablet Take 1 tablet (4 mg total) by mouth every 6 (six) hours as needed for muscle spasms.     Allergies:   Patient has no known allergies.   Social History   Socioeconomic History  . Marital status: Married    Spouse name: Not on file  . Number of children: Not on file  . Years of education: Not on file  . Highest education level: Not on file  Occupational History  . Not on file  Tobacco Use  . Smoking status: Former Smoker    Types: Cigarettes, Cigars    Quit date: 01/25/2009    Years since quitting: 11.9  . Smokeless tobacco: Never Used  Vaping Use  . Vaping Use: Never used  Substance and Sexual Activity  . Alcohol use: No  . Drug use: No  . Sexual activity: Not on file  Other Topics Concern  . Not on file  Social History Narrative  . Not on file   Social Determinants of Health   Financial Resource Strain: Not on file  Food Insecurity: Not on file  Transportation Needs: Not on file  Physical Activity: Not on file  Stress: Not on file  Social Connections: Not on file     Family History: The patient's family history includes Healthy in his sister; Heart disease in his mother.  ROS:   Please see the history of present illness.  All other systems reviewed and are negative.  EKGs/Labs/Other Studies Reviewed:    The following studies were reviewed today: Echo May 2019- Study Conclusions   - Left ventricle: The cavity size was normal. Wall thickness was  increased in a pattern of moderate LVH. Definity contrast was  used due to limited images. Systolic function was normal. The  estimated ejection fraction was in the range of 55% to 60%. Wall  motion was normal; there were no regional wall motion  abnormalities. Doppler parameters are consistent with abnormal  left ventricular relaxation  (grade 1 diastolic dysfunction).  - Aortic valve: Poorly visualized. Mildly calcified leaflets. There  was trivial regurgitation.  - Mitral valve: There was trivial regurgitation.  - Right ventricle: The cavity size was mildly dilated.  - Right atrium: Central venous pressure (est): 3 mm Hg.  - Atrial septum: No defect or patent foramen ovale was identified.  - Tricuspid valve: There was trivial regurgitation.  - Pulmonary arteries: Systolic pressure could not be accurately  estimated.  - Pericardium, extracardiac: A prominent pericardial fat pad was  present.   EKG:  EKG is ordered today.  The ekg ordered today demonstrates NSR, 68  Recent Labs: 07/08/2020: BUN 18; Creatinine,  Ser 0.93; Hemoglobin 16.0; Platelets 279; Potassium 3.8; Sodium 139  Recent Lipid Panel    Component Value Date/Time   CHOL 109 08/26/2018 0817   TRIG 60 08/26/2018 0817   HDL 36 (L) 08/26/2018 0817   CHOLHDL 3.0 08/26/2018 0817   CHOLHDL 2.7 07/05/2013 0920   VLDL 12 07/05/2013 0920   LDLCALC 61 08/26/2018 0817    Physical Exam:    VS:  BP (!) 142/77   Pulse 69   Ht 5' 9.5" (1.765 m)   Wt (!) 325 lb (147.4 kg)   SpO2 98%   BMI 47.31 kg/m     Wt Readings from Last 3 Encounters:  12/17/20 (!) 325 lb (147.4 kg)  07/10/20 (!) 327 lb (148.3 kg)  07/08/20 (!) 327 lb 11.2 oz (148.6 kg)     GEN: Morbidly obese Caucasian male, in no acute distress HEENT: Normal NECK: No JVD; No carotid bruits CARDIAC: RRR, no murmurs, rubs, gallops RESPIRATORY:  Clear to auscultation without rales, wheezing or rhonchi  ABDOMEN: Obese, soft,  non-distended MUSCULOSKELETAL:  No edema; No deformity  SKIN: Warm and dry NEUROLOGIC:  Alert and oriented x 3 PSYCHIATRIC:  Normal affect   ASSESSMENT:    CAD S/P percutaneous coronary angioplasty LAD PCI with DES 2010, Myoview low risk 2013. No history of recent angina  Hyperlipemia Followed by PCP  Morbid obesity ([place]) BMI 47. I asked him to speak with his  PCP about the possibility of starting medication for this  Obstructive sleep apnea Compliant with C-pap  PLAN:    No change in cardiac Rx- f/u Dr Gwenlyn Found in one year.    Medication Adjustments/Labs and Tests Ordered: Current medicines are reviewed at length with the patient today.  Concerns regarding medicines are outlined above.  Orders Placed This Encounter  Procedures  . EKG 12-Lead   Meds ordered this encounter  Medications  . nebivolol (BYSTOLIC) 5 MG tablet    Sig: Take 1 tablet (5 mg total) by mouth daily.    Dispense:  90 tablet    Refill:  3    Patient Instructions  Medication Instructions:  Continue current medications  *If you need a refill on your cardiac medications before your next appointment, please call your pharmacy*   Lab Work: None Ordered   Testing/Procedures: None Ordered   Follow-Up: At [facility], you and your health needs are our priority.  As part of our continuing mission to provide you with exceptional heart care, we have created designated Provider Care Teams.  These Care Teams include your primary Cardiologist (physician) and Advanced Practice Providers (APPs -  Physician Assistants and Nurse Practitioners) who all work together to provide you with the care you need, when you need it.  We recommend signing up for the patient portal called "MyChart".  Sign up information is provided on this After Visit Summary.  MyChart is used to connect with patients for Virtual Visits (Telemedicine).  Patients are able to view lab/test results, encounter notes, upcoming appointments, etc.  Non-urgent messages can be sent to your provider as well.   To learn more about what you can do with MyChart, go to [email or web address].    Your next appointment:   1 year(s)  The format for your next appointment:   In Person  Provider:   You may see Quay Burow, MD or one of the following Advanced Practice Providers on your designated Care Team:    Kerin Ransom, PA-C  [place], [place]  Coletta Memos, [place]  Signed, Kerin Ransom, PA-C  12/17/2020 10:15 AM    Antonio Colon

## 2021-01-06 ENCOUNTER — Other Ambulatory Visit: Payer: Self-pay

## 2021-01-06 ENCOUNTER — Ambulatory Visit: Payer: Self-pay

## 2021-01-06 ENCOUNTER — Other Ambulatory Visit: Payer: Self-pay | Admitting: Family Medicine

## 2021-01-06 DIAGNOSIS — M25562 Pain in left knee: Secondary | ICD-10-CM

## 2021-01-10 ENCOUNTER — Other Ambulatory Visit: Payer: Self-pay | Admitting: Cardiovascular Disease

## 2021-01-10 ENCOUNTER — Other Ambulatory Visit: Payer: Self-pay

## 2021-01-10 MED ORDER — ROSUVASTATIN CALCIUM 5 MG PO TABS: ORAL_TABLET | ORAL | 3 refills | Status: DC

## 2021-03-10 DIAGNOSIS — G4733 Obstructive sleep apnea (adult) (pediatric): Secondary | ICD-10-CM | POA: Diagnosis not present

## 2021-04-16 ENCOUNTER — Emergency Department (HOSPITAL_COMMUNITY): Payer: Medicare Other

## 2021-04-16 ENCOUNTER — Encounter (HOSPITAL_COMMUNITY): Payer: Self-pay

## 2021-04-16 ENCOUNTER — Emergency Department (HOSPITAL_COMMUNITY)
Admission: EM | Admit: 2021-04-16 | Discharge: 2021-04-17 | Disposition: A | Discharge status: Home / Self Care | Payer: Medicare Other | Attending: Emergency Medicine | Admitting: Emergency Medicine

## 2021-04-16 ENCOUNTER — Other Ambulatory Visit: Payer: Self-pay

## 2021-04-16 DIAGNOSIS — J069 Acute upper respiratory infection, unspecified: Secondary | ICD-10-CM | POA: Diagnosis not present

## 2021-04-16 DIAGNOSIS — Z20822 Contact with and (suspected) exposure to covid-19: Secondary | ICD-10-CM | POA: Diagnosis not present

## 2021-04-16 DIAGNOSIS — I10 Essential (primary) hypertension: Secondary | ICD-10-CM | POA: Insufficient documentation

## 2021-04-16 DIAGNOSIS — I2510 Atherosclerotic heart disease of native coronary artery without angina pectoris: Secondary | ICD-10-CM | POA: Insufficient documentation

## 2021-04-16 DIAGNOSIS — Z87891 Personal history of nicotine dependence: Secondary | ICD-10-CM | POA: Diagnosis not present

## 2021-04-16 DIAGNOSIS — R059 Cough, unspecified: Secondary | ICD-10-CM | POA: Diagnosis not present

## 2021-04-16 DIAGNOSIS — B9789 Other viral agents as the cause of diseases classified elsewhere: Secondary | ICD-10-CM | POA: Diagnosis not present

## 2021-04-16 LAB — RESP PANEL BY RT-PCR (FLU A&B, COVID) ARPGX2
Influenza A by PCR: NEGATIVE
Influenza B by PCR: NEGATIVE
SARS Coronavirus 2 by RT PCR: NEGATIVE

## 2021-04-16 LAB — GROUP A STREP BY PCR: Group A Strep by PCR: NOT DETECTED

## 2021-04-16 NOTE — ED Triage Notes (Signed)
Pt to er room number 2, pt states that Monday he had a sore throat, states that his sore throat is a little bit better, but now he has a cough and general body aches.

## 2021-04-16 NOTE — ED Provider Notes (Signed)
Emergency Department Provider Note   I have reviewed the triage vital signs and the nursing notes.   HISTORY  Chief Complaint Cough   HPI Antonio Colon is a 70 y.o. male with PMH reviewed including CAD presents to the emergency department for evaluation of cough, congestion, body aches.  Symptoms have been present over the past 2 to 3 days.  No sick contacts.  Patient states symptoms began is primarily sore throat that is gradually improved although not completely gone.  He is developed persistent cough without chest pain or shortness of breath.  He is also feeling some body aches but has not recorded fever at home.  No vomiting or diarrhea symptoms.  No changes to medications. No radiation of symptoms or modifying factors.    Past Medical History:  Diagnosis Date  . CAD (coronary artery disease)    stents  . GERD (gastroesophageal reflux disease)   . Heart disease   . Heart murmur    childhood  . Hiatal hernia   . History of stress test 01/27/2012   Normal study. No significant ischemia demonstration, this low risk scan, no significant change compared to previous study.  . Hypercholesteremia   . Hyperlipidemia   . Hypertension   . Myocardial infarction ([place]) 2010  . Obesity   . Pneumonia 2019  . Sleep apnea    cpap at night    Patient Active Problem List   Diagnosis Date Noted  . Spondylolisthesis of lumbar region 07/10/2020  . Positive colorectal cancer screening using Cologuard test 12/23/2018  . Obstructive sleep apnea 08/26/2018  . Respiratory failure ([place]) 06/01/2018  . Morbid obesity ([place]) 06/01/2018  . NAFLD (nonalcoholic fatty liver disease) 01/26/2012  . GERD (gastroesophageal reflux disease) 01/26/2012  . CAD S/P percutaneous coronary angioplasty 01/26/2012  . Hyperlipemia 01/26/2012  . HTN (hypertension) 01/26/2012  . FRACTURE, MEDIAL MALLEOLUS 11/20/2008  . ANKLE SPRAIN, LEFT 11/20/2008    Past Surgical History:  Procedure Laterality Date  . BACK  SURGERY  10/2019   Dr. Christella Noa: right L4-5 laminectomy  . CARDIAC CATHETERIZATION  10/2009   CAD stents to the LAD using an Endeavor drug eluting (3x75mm) by Dr Rex Kras, he had normal circumflex and RCA at the time.  . COLONOSCOPY  06/15/2012  . COLONOSCOPY N/A 01/09/2019   Procedure: COLONOSCOPY;  Surgeon: Rogene Houston, MD;  [facility];  Service: Endoscopy;  Laterality: N/A;  2:10pm  . ESOPHAGOGASTRODUODENOSCOPY N/A 11/15/2015   Procedure: ESOPHAGOGASTRODUODENOSCOPY (EGD);  Surgeon: Rogene Houston, MD;  Location[facility];  Service: Endoscopy;  Laterality: N/A;  1:10 - moved to 12:55 - Ann notified pt  . POLYPECTOMY  01/09/2019   Procedure: POLYPECTOMY;  Surgeon: Rogene Houston, MD;  Location: [facility];  Service: Endoscopy;;  colon  . TONSILLECTOMY      Allergies Patient has no known allergies.  Family History  Problem Relation Age of Onset  . Heart disease Mother   . Healthy Sister     Social History Social History   Tobacco Use  . Smoking status: Former Smoker    Types: Cigarettes, Cigars    Quit date: 01/25/2009    Years since quitting: 12.2  . Smokeless tobacco: Never Used  Vaping Use  . Vaping Use: Never used  Substance Use Topics  . Alcohol use: No  . Drug use: No    Review of Systems  Constitutional: No fever/chills. Positive body aches.  Eyes: No visual changes. ENT: Positive sore throat.  Cardiovascular: Denies chest pain. Respiratory: Denies shortness of breath. Positive cough.  Gastrointestinal: No abdominal pain.  No nausea, no vomiting.  No diarrhea.  No constipation. Genitourinary: Negative for dysuria. Musculoskeletal: Negative for back pain. Skin: Negative for rash. Neurological: Negative for headaches, focal weakness or numbness.  10-point ROS otherwise negative.  ____________________________________________   PHYSICAL EXAM:  VITAL SIGNS: ED Triage Vitals  Enc Vitals Group     BP 04/16/21 2240 (!) 148/71      Pulse Rate 04/16/21 2240 70     Resp 04/16/21 2240 18     Temp 04/16/21 2240 97.9 F (36.6 C)     Temp Source 04/16/21 2240 Oral     SpO2 04/16/21 2240 97 %     Weight 04/16/21 2242 (!) 326 lb (147.9 kg)     Height 04/16/21 2242 5\' 10"  (1.778 m)   Constitutional: Alert and oriented. Well appearing and in no acute distress. Eyes: Conjunctivae are normal.  Head: Atraumatic. Nose: Mild congestion/rhinnorhea. Mouth/Throat: Mucous membranes are moist.  Oropharynx erythematous with mild exudate present. No PTA. No trismus. Speaking in a clear voice.  Neck: No stridor.  Cardiovascular: Normal rate, regular rhythm. Good peripheral circulation. Grossly normal heart sounds.   Respiratory: Normal respiratory effort.  No retractions. Lungs CTAB. Gastrointestinal: Soft and nontender. No distention.  Musculoskeletal: No lower extremity tenderness nor edema. No gross deformities of extremities. Neurologic:  Normal speech and language.  Skin:  Skin is warm, dry and intact. No rash noted.  ____________________________________________   LABS (all labs ordered are listed, but only abnormal results are displayed)  Labs Reviewed  GROUP A STREP BY PCR  RESP PANEL BY RT-PCR (FLU A&B, COVID) [id number]   ____________________________________________  RADIOLOGY  DG Chest Portable 1 View  Result Date: 04/16/2021 CLINICAL DATA:  Cough EXAM: PORTABLE CHEST 1 VIEW COMPARISON:  06/15/2018 FINDINGS: Cardiac shadow is mildly enlarged but stable. Lungs are well aerated bilaterally. No focal infiltrate or effusion is seen. No bony abnormality is noted. IMPRESSION: No active disease. Electronically Signed   By: Inez Catalina M.D.   On: 04/16/2021 23:24    ____________________________________________   PROCEDURES  Procedure(s) performed:   Procedures  None  ____________________________________________   INITIAL IMPRESSION / ASSESSMENT AND PLAN / ED COURSE  Pertinent labs & imaging results that were  available during my care of the patient were reviewed by me and considered in my medical decision making (see chart for details).   Patient presents emergency department primarily with flulike symptoms over the past 2 to 3 days.  He does have some exudate on evaluation of his posterior pharynx but no peritonsillar abscess.  Plan for strep PCR along with flu/COVID swabs and chest x-ray.  Vital signs are within normal limits other than mild hypertension.  No hypoxemia.  Patient not having increased work of breathing.   Chest x-ray reviewed with no acute findings, infiltrate, pneumothorax.  COVID and flu PCR's are negative.  Strep PCR is also negative.  Plan for supportive care at home with PCP follow-up plan.  ____________________________________________  FINAL CLINICAL IMPRESSION(S) / ED DIAGNOSES  Final diagnoses:  Viral URI with cough    Note:  This document was prepared using Dragon voice recognition software and may include unintentional dictation errors.  Nanda Quinton, MD, [facility]    Cyntha Brickman, [facility], MD 04/16/21 [phone]

## 2021-04-16 NOTE — Discharge Instructions (Signed)
You were seen in the emergency department today with cough, sore throat, body aches.  Your chest x-ray did not show pneumonia.  He tested for COVID, flu, strep throat and this is all come back negative.  I suspect she had a respiratory virus that will take several more days to a week to improve.  You may take over-the-counter cough and cold medications.  Return to the emergency department any chest pain, shortness of breath, confusion, sudden/worsening symptoms.

## 2021-05-16 DIAGNOSIS — L989 Disorder of the skin and subcutaneous tissue, unspecified: Secondary | ICD-10-CM | POA: Diagnosis not present

## 2021-05-16 DIAGNOSIS — G4700 Insomnia, unspecified: Secondary | ICD-10-CM | POA: Diagnosis not present

## 2021-05-28 DIAGNOSIS — S2239XA Fracture of one rib, unspecified side, initial encounter for closed fracture: Secondary | ICD-10-CM

## 2021-05-28 HISTORY — DX: Fracture of one rib, unspecified side, initial encounter for closed fracture: S22.39XA

## 2021-06-03 ENCOUNTER — Emergency Department (HOSPITAL_COMMUNITY): Payer: Medicare Other

## 2021-06-03 ENCOUNTER — Other Ambulatory Visit: Payer: Self-pay | Admitting: Cardiovascular Disease

## 2021-06-03 ENCOUNTER — Encounter (HOSPITAL_COMMUNITY): Payer: Self-pay | Admitting: *Deleted

## 2021-06-03 ENCOUNTER — Emergency Department (HOSPITAL_COMMUNITY)
Admission: EM | Admit: 2021-06-03 | Discharge: 2021-06-03 | Disposition: A | Discharge status: Home / Self Care | Payer: Medicare Other | Attending: Emergency Medicine | Admitting: Emergency Medicine

## 2021-06-03 DIAGNOSIS — I2510 Atherosclerotic heart disease of native coronary artery without angina pectoris: Secondary | ICD-10-CM | POA: Insufficient documentation

## 2021-06-03 DIAGNOSIS — Z7982 Long term (current) use of aspirin: Secondary | ICD-10-CM | POA: Insufficient documentation

## 2021-06-03 DIAGNOSIS — Y92410 Unspecified street and highway as the place of occurrence of the external cause: Secondary | ICD-10-CM | POA: Insufficient documentation

## 2021-06-03 DIAGNOSIS — M79605 Pain in left leg: Secondary | ICD-10-CM | POA: Diagnosis not present

## 2021-06-03 DIAGNOSIS — R58 Hemorrhage, not elsewhere classified: Secondary | ICD-10-CM | POA: Diagnosis not present

## 2021-06-03 DIAGNOSIS — S2241XA Multiple fractures of ribs, right side, initial encounter for closed fracture: Secondary | ICD-10-CM | POA: Diagnosis not present

## 2021-06-03 DIAGNOSIS — K8020 Calculus of gallbladder without cholecystitis without obstruction: Secondary | ICD-10-CM | POA: Diagnosis not present

## 2021-06-03 DIAGNOSIS — M47814 Spondylosis without myelopathy or radiculopathy, thoracic region: Secondary | ICD-10-CM | POA: Diagnosis not present

## 2021-06-03 DIAGNOSIS — M79601 Pain in right arm: Secondary | ICD-10-CM | POA: Insufficient documentation

## 2021-06-03 DIAGNOSIS — R609 Edema, unspecified: Secondary | ICD-10-CM | POA: Diagnosis not present

## 2021-06-03 DIAGNOSIS — S299XXA Unspecified injury of thorax, initial encounter: Secondary | ICD-10-CM | POA: Diagnosis present

## 2021-06-03 DIAGNOSIS — M7989 Other specified soft tissue disorders: Secondary | ICD-10-CM | POA: Diagnosis not present

## 2021-06-03 DIAGNOSIS — M25531 Pain in right wrist: Secondary | ICD-10-CM | POA: Diagnosis not present

## 2021-06-03 DIAGNOSIS — R1012 Left upper quadrant pain: Secondary | ICD-10-CM | POA: Insufficient documentation

## 2021-06-03 DIAGNOSIS — Z79899 Other long term (current) drug therapy: Secondary | ICD-10-CM | POA: Diagnosis not present

## 2021-06-03 DIAGNOSIS — R52 Pain, unspecified: Secondary | ICD-10-CM | POA: Diagnosis not present

## 2021-06-03 DIAGNOSIS — S2231XA Fracture of one rib, right side, initial encounter for closed fracture: Secondary | ICD-10-CM

## 2021-06-03 DIAGNOSIS — Z87891 Personal history of nicotine dependence: Secondary | ICD-10-CM | POA: Insufficient documentation

## 2021-06-03 DIAGNOSIS — Z743 Need for continuous supervision: Secondary | ICD-10-CM | POA: Diagnosis not present

## 2021-06-03 DIAGNOSIS — I10 Essential (primary) hypertension: Secondary | ICD-10-CM | POA: Insufficient documentation

## 2021-06-03 DIAGNOSIS — R079 Chest pain, unspecified: Secondary | ICD-10-CM | POA: Insufficient documentation

## 2021-06-03 DIAGNOSIS — K7689 Other specified diseases of liver: Secondary | ICD-10-CM | POA: Diagnosis not present

## 2021-06-03 DIAGNOSIS — K5730 Diverticulosis of large intestine without perforation or abscess without bleeding: Secondary | ICD-10-CM | POA: Diagnosis not present

## 2021-06-03 DIAGNOSIS — M25521 Pain in right elbow: Secondary | ICD-10-CM | POA: Diagnosis not present

## 2021-06-03 DIAGNOSIS — Z041 Encounter for examination and observation following transport accident: Secondary | ICD-10-CM | POA: Diagnosis not present

## 2021-06-03 DIAGNOSIS — J8410 Pulmonary fibrosis, unspecified: Secondary | ICD-10-CM | POA: Diagnosis not present

## 2021-06-03 DIAGNOSIS — S3991XA Unspecified injury of abdomen, initial encounter: Secondary | ICD-10-CM | POA: Diagnosis not present

## 2021-06-03 LAB — I-STAT CREATININE, ED: Creatinine, Ser: 1 mg/dL (ref 0.61–1.24)

## 2021-06-03 MED ORDER — METHYL SALICYLATE-LIDO-MENTHOL 4-4-5 % EX PTCH: 1.0000 | MEDICATED_PATCH | Freq: Two times a day (BID) | CUTANEOUS | 0 refills | Status: DC

## 2021-06-03 MED ORDER — SODIUM CHLORIDE 0.9 % IV BOLUS
500.0000 mL | Freq: Once | INTRAVENOUS | Status: AC
  Administered 2021-06-03: 500 mL via INTRAVENOUS

## 2021-06-03 MED ORDER — HYDROCODONE-ACETAMINOPHEN 5-325 MG PO TABS: 1.0000 | ORAL_TABLET | Freq: Four times a day (QID) | ORAL | 0 refills | Status: DC | PRN

## 2021-06-03 MED ORDER — MORPHINE SULFATE (PF) 4 MG/ML IV SOLN
4.0000 mg | Freq: Once | INTRAVENOUS | Status: AC
  Administered 2021-06-03: 4 mg via INTRAVENOUS
  Filled 2021-06-03: qty 1

## 2021-06-03 MED ORDER — DICLOFENAC EPOLAMINE 1.3 % EX PTCH
1.0000 | MEDICATED_PATCH | Freq: Two times a day (BID) | CUTANEOUS | Status: DC
  Administered 2021-06-03: 1 via TRANSDERMAL
  Filled 2021-06-03: qty 1

## 2021-06-03 MED ORDER — IOHEXOL 300 MG/ML  SOLN
100.0000 mL | Freq: Once | INTRAMUSCULAR | Status: AC | PRN
  Administered 2021-06-03: 100 mL via INTRAVENOUS
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
## 2021-06-03 NOTE — ED Triage Notes (Signed)
C/o pain in right elbow and left lower leg area
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
## 2021-06-04 DIAGNOSIS — S2231XD Fracture of one rib, right side, subsequent encounter for fracture with routine healing: Secondary | ICD-10-CM | POA: Diagnosis not present

## 2021-06-04 DIAGNOSIS — K5730 Diverticulosis of large intestine without perforation or abscess without bleeding: Secondary | ICD-10-CM | POA: Diagnosis not present

## 2021-06-04 DIAGNOSIS — K8020 Calculus of gallbladder without cholecystitis without obstruction: Secondary | ICD-10-CM | POA: Diagnosis not present

## 2021-06-04 DIAGNOSIS — R911 Solitary pulmonary nodule: Secondary | ICD-10-CM | POA: Diagnosis not present

## 2021-06-05 ENCOUNTER — Encounter (INDEPENDENT_AMBULATORY_CARE_PROVIDER_SITE_OTHER): Payer: Self-pay | Admitting: *Deleted

## 2021-06-18 DIAGNOSIS — Z0000 Encounter for general adult medical examination without abnormal findings: Secondary | ICD-10-CM | POA: Diagnosis not present

## 2021-06-18 DIAGNOSIS — Z7689 Persons encountering health services in other specified circumstances: Secondary | ICD-10-CM | POA: Diagnosis not present

## 2021-06-18 DIAGNOSIS — I10 Essential (primary) hypertension: Secondary | ICD-10-CM | POA: Diagnosis not present

## 2021-06-23 DIAGNOSIS — K5730 Diverticulosis of large intestine without perforation or abscess without bleeding: Secondary | ICD-10-CM | POA: Diagnosis not present

## 2021-06-23 DIAGNOSIS — S2231XD Fracture of one rib, right side, subsequent encounter for fracture with routine healing: Secondary | ICD-10-CM | POA: Diagnosis not present

## 2021-06-23 DIAGNOSIS — K8020 Calculus of gallbladder without cholecystitis without obstruction: Secondary | ICD-10-CM | POA: Diagnosis not present

## 2021-06-23 DIAGNOSIS — E785 Hyperlipidemia, unspecified: Secondary | ICD-10-CM | POA: Diagnosis not present

## 2021-06-23 DIAGNOSIS — I10 Essential (primary) hypertension: Secondary | ICD-10-CM | POA: Diagnosis not present

## 2021-06-23 DIAGNOSIS — R911 Solitary pulmonary nodule: Secondary | ICD-10-CM | POA: Diagnosis not present

## 2021-06-23 DIAGNOSIS — I2510 Atherosclerotic heart disease of native coronary artery without angina pectoris: Secondary | ICD-10-CM | POA: Diagnosis not present

## 2021-06-23 DIAGNOSIS — N3281 Overactive bladder: Secondary | ICD-10-CM | POA: Diagnosis not present

## 2021-07-08 DIAGNOSIS — C4359 Malignant melanoma of other part of trunk: Secondary | ICD-10-CM | POA: Diagnosis not present

## 2021-07-10 DIAGNOSIS — R911 Solitary pulmonary nodule: Secondary | ICD-10-CM | POA: Diagnosis not present

## 2021-07-10 DIAGNOSIS — S2231XD Fracture of one rib, right side, subsequent encounter for fracture with routine healing: Secondary | ICD-10-CM | POA: Diagnosis not present

## 2021-07-23 DIAGNOSIS — D485 Neoplasm of uncertain behavior of skin: Secondary | ICD-10-CM | POA: Diagnosis not present

## 2021-07-28 HISTORY — PX: MELANOMA EXCISION: SHX5266

## 2021-07-28 HISTORY — PX: AXILLARY LYMPH NODE DISSECTION: SHX5229

## 2021-07-30 DIAGNOSIS — C439 Malignant melanoma of skin, unspecified: Secondary | ICD-10-CM | POA: Diagnosis not present

## 2021-08-04 DIAGNOSIS — I10 Essential (primary) hypertension: Secondary | ICD-10-CM | POA: Diagnosis not present

## 2021-08-04 DIAGNOSIS — M4726 Other spondylosis with radiculopathy, lumbar region: Secondary | ICD-10-CM | POA: Diagnosis not present

## 2021-08-19 DIAGNOSIS — Z79899 Other long term (current) drug therapy: Secondary | ICD-10-CM | POA: Diagnosis not present

## 2021-08-19 DIAGNOSIS — M4316 Spondylolisthesis, lumbar region: Secondary | ICD-10-CM | POA: Diagnosis not present

## 2021-08-19 DIAGNOSIS — I2510 Atherosclerotic heart disease of native coronary artery without angina pectoris: Secondary | ICD-10-CM | POA: Diagnosis not present

## 2021-08-19 DIAGNOSIS — C4361 Malignant melanoma of right upper limb, including shoulder: Secondary | ICD-10-CM | POA: Diagnosis not present

## 2021-08-19 DIAGNOSIS — D0359 Melanoma in situ of other part of trunk: Secondary | ICD-10-CM | POA: Diagnosis not present

## 2021-08-19 DIAGNOSIS — I10 Essential (primary) hypertension: Secondary | ICD-10-CM | POA: Diagnosis not present

## 2021-08-19 DIAGNOSIS — Z955 Presence of coronary angioplasty implant and graft: Secondary | ICD-10-CM | POA: Diagnosis not present

## 2021-08-19 DIAGNOSIS — K760 Fatty (change of) liver, not elsewhere classified: Secondary | ICD-10-CM | POA: Diagnosis not present

## 2021-08-19 DIAGNOSIS — K219 Gastro-esophageal reflux disease without esophagitis: Secondary | ICD-10-CM | POA: Diagnosis not present

## 2021-08-19 DIAGNOSIS — Z87891 Personal history of nicotine dependence: Secondary | ICD-10-CM | POA: Diagnosis not present

## 2021-08-19 DIAGNOSIS — I252 Old myocardial infarction: Secondary | ICD-10-CM | POA: Diagnosis not present

## 2021-08-19 DIAGNOSIS — C4359 Malignant melanoma of other part of trunk: Secondary | ICD-10-CM | POA: Diagnosis not present

## 2021-08-19 DIAGNOSIS — D225 Melanocytic nevi of trunk: Secondary | ICD-10-CM | POA: Diagnosis not present

## 2021-08-19 DIAGNOSIS — G4733 Obstructive sleep apnea (adult) (pediatric): Secondary | ICD-10-CM | POA: Diagnosis not present

## 2021-08-21 ENCOUNTER — Ambulatory Visit (INDEPENDENT_AMBULATORY_CARE_PROVIDER_SITE_OTHER): Payer: Medicare Other | Admitting: Gastroenterology

## 2021-08-25 DIAGNOSIS — Z08 Encounter for follow-up examination after completed treatment for malignant neoplasm: Secondary | ICD-10-CM | POA: Diagnosis not present

## 2021-08-25 DIAGNOSIS — Z85820 Personal history of malignant melanoma of skin: Secondary | ICD-10-CM | POA: Diagnosis not present

## 2021-09-03 DIAGNOSIS — C439 Malignant melanoma of skin, unspecified: Secondary | ICD-10-CM | POA: Diagnosis not present

## 2021-09-03 DIAGNOSIS — Z09 Encounter for follow-up examination after completed treatment for conditions other than malignant neoplasm: Secondary | ICD-10-CM | POA: Diagnosis not present

## 2021-09-15 DIAGNOSIS — Z09 Encounter for follow-up examination after completed treatment for conditions other than malignant neoplasm: Secondary | ICD-10-CM | POA: Diagnosis not present

## 2021-09-15 DIAGNOSIS — C439 Malignant melanoma of skin, unspecified: Secondary | ICD-10-CM | POA: Diagnosis not present

## 2021-09-24 DIAGNOSIS — G4733 Obstructive sleep apnea (adult) (pediatric): Secondary | ICD-10-CM | POA: Diagnosis not present

## 2021-10-02 ENCOUNTER — Other Ambulatory Visit (HOSPITAL_BASED_OUTPATIENT_CLINIC_OR_DEPARTMENT_OTHER): Payer: Self-pay | Admitting: Family Medicine

## 2021-10-02 ENCOUNTER — Other Ambulatory Visit (HOSPITAL_COMMUNITY): Payer: Self-pay | Admitting: Family Medicine

## 2021-10-02 DIAGNOSIS — R911 Solitary pulmonary nodule: Secondary | ICD-10-CM

## 2021-10-04 ENCOUNTER — Other Ambulatory Visit: Payer: Self-pay | Admitting: Cardiovascular Disease

## 2021-10-09 ENCOUNTER — Other Ambulatory Visit: Payer: Self-pay | Admitting: Cardiovascular Disease

## 2021-10-20 ENCOUNTER — Ambulatory Visit (HOSPITAL_COMMUNITY)
Admission: RE | Admit: 2021-10-20 | Discharge: 2021-10-20 | Disposition: A | Discharge status: Home / Self Care | Payer: Medicare Other | Source: Ambulatory Visit | Attending: Family Medicine | Admitting: Family Medicine

## 2021-10-20 ENCOUNTER — Other Ambulatory Visit: Payer: Self-pay

## 2021-10-20 DIAGNOSIS — I700 Atherosclerosis of aorta: Secondary | ICD-10-CM | POA: Diagnosis not present

## 2021-10-20 DIAGNOSIS — R911 Solitary pulmonary nodule: Secondary | ICD-10-CM | POA: Insufficient documentation

## 2021-10-20 DIAGNOSIS — R918 Other nonspecific abnormal finding of lung field: Secondary | ICD-10-CM | POA: Diagnosis not present

## 2021-10-30 ENCOUNTER — Encounter (INDEPENDENT_AMBULATORY_CARE_PROVIDER_SITE_OTHER): Payer: Self-pay | Admitting: Gastroenterology

## 2021-10-30 ENCOUNTER — Other Ambulatory Visit: Payer: Self-pay

## 2021-10-30 ENCOUNTER — Ambulatory Visit (INDEPENDENT_AMBULATORY_CARE_PROVIDER_SITE_OTHER): Payer: Medicare Other | Admitting: Gastroenterology

## 2021-10-30 VITALS — BP 147/76 | HR 77 | Temp 97.9°F | Ht 70.0 in | Wt 328.8 lb

## 2021-10-30 DIAGNOSIS — K5730 Diverticulosis of large intestine without perforation or abscess without bleeding: Secondary | ICD-10-CM | POA: Diagnosis not present

## 2021-10-30 DIAGNOSIS — K769 Liver disease, unspecified: Secondary | ICD-10-CM | POA: Diagnosis not present

## 2021-10-30 DIAGNOSIS — K8020 Calculus of gallbladder without cholecystitis without obstruction: Secondary | ICD-10-CM

## 2021-10-30 MED ORDER — ATIVAN 0.5 MG PO TABS: 0.2500 mg | ORAL_TABLET | ORAL | 0 refills | Status: DC | PRN
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
## 2021-10-30 NOTE — Patient Instructions (Addendum)
You had a single gallstone in your gallbladder, without any inflammation or obvious blockage. Since you are not having pain, nausea or vomiting, typically a surgeon would not want to remove your gallbladder at this time.   I will get your labs from Dr. Nevada Crane for further evaluation, we will order an MRI just to further evaluate the lesion on your liver and confirm that it is in fact a cyst, however, since it has been present for many years, I have a low suspicion it is anything concerning.   Follow up as needed

## 2021-11-10 ENCOUNTER — Encounter (INDEPENDENT_AMBULATORY_CARE_PROVIDER_SITE_OTHER): Payer: Self-pay

## 2021-11-18 ENCOUNTER — Ambulatory Visit (HOSPITAL_COMMUNITY)
Admission: RE | Admit: 2021-11-18 | Discharge: 2021-11-18 | Disposition: A | Discharge status: Home / Self Care | Payer: Medicare Other | Source: Ambulatory Visit | Attending: Gastroenterology | Admitting: Gastroenterology

## 2021-11-18 ENCOUNTER — Other Ambulatory Visit (INDEPENDENT_AMBULATORY_CARE_PROVIDER_SITE_OTHER): Payer: Self-pay

## 2021-11-18 ENCOUNTER — Other Ambulatory Visit: Payer: Self-pay

## 2021-11-18 DIAGNOSIS — K769 Liver disease, unspecified: Secondary | ICD-10-CM | POA: Diagnosis not present

## 2021-11-18 DIAGNOSIS — K5730 Diverticulosis of large intestine without perforation or abscess without bleeding: Secondary | ICD-10-CM | POA: Diagnosis not present

## 2021-11-18 DIAGNOSIS — K7689 Other specified diseases of liver: Secondary | ICD-10-CM | POA: Diagnosis not present

## 2021-11-18 MED ORDER — GADOBUTROL 1 MMOL/ML IV SOLN
10.0000 mL | Freq: Once | INTRAVENOUS | Status: AC | PRN
  Administered 2021-11-18: 10 mL via INTRAVENOUS

## 2021-11-28 DIAGNOSIS — G4733 Obstructive sleep apnea (adult) (pediatric): Secondary | ICD-10-CM | POA: Diagnosis not present

## 2021-12-02 ENCOUNTER — Other Ambulatory Visit (HOSPITAL_COMMUNITY): Payer: Self-pay | Admitting: *Deleted

## 2021-12-02 ENCOUNTER — Other Ambulatory Visit: Payer: Self-pay | Admitting: *Deleted

## 2021-12-02 DIAGNOSIS — R911 Solitary pulmonary nodule: Secondary | ICD-10-CM

## 2021-12-09 DIAGNOSIS — Z08 Encounter for follow-up examination after completed treatment for malignant neoplasm: Secondary | ICD-10-CM | POA: Diagnosis not present

## 2021-12-09 DIAGNOSIS — Z1283 Encounter for screening for malignant neoplasm of skin: Secondary | ICD-10-CM | POA: Diagnosis not present

## 2021-12-09 DIAGNOSIS — Z85820 Personal history of malignant melanoma of skin: Secondary | ICD-10-CM | POA: Diagnosis not present

## 2021-12-18 DIAGNOSIS — R197 Diarrhea, unspecified: Secondary | ICD-10-CM | POA: Diagnosis not present

## 2021-12-18 DIAGNOSIS — A084 Viral intestinal infection, unspecified: Secondary | ICD-10-CM | POA: Diagnosis not present

## 2021-12-18 DIAGNOSIS — R638 Other symptoms and signs concerning food and fluid intake: Secondary | ICD-10-CM | POA: Diagnosis not present

## 2021-12-18 DIAGNOSIS — R509 Fever, unspecified: Secondary | ICD-10-CM | POA: Diagnosis not present

## 2021-12-18 DIAGNOSIS — R6883 Chills (without fever): Secondary | ICD-10-CM | POA: Diagnosis not present

## 2021-12-19 DIAGNOSIS — I10 Essential (primary) hypertension: Secondary | ICD-10-CM | POA: Diagnosis not present

## 2021-12-23 ENCOUNTER — Encounter (INDEPENDENT_AMBULATORY_CARE_PROVIDER_SITE_OTHER): Payer: Self-pay | Admitting: *Deleted

## 2021-12-23 DIAGNOSIS — I2510 Atherosclerotic heart disease of native coronary artery without angina pectoris: Secondary | ICD-10-CM | POA: Diagnosis not present

## 2021-12-23 DIAGNOSIS — I10 Essential (primary) hypertension: Secondary | ICD-10-CM | POA: Diagnosis not present

## 2021-12-23 DIAGNOSIS — K8020 Calculus of gallbladder without cholecystitis without obstruction: Secondary | ICD-10-CM | POA: Diagnosis not present

## 2021-12-23 DIAGNOSIS — N3281 Overactive bladder: Secondary | ICD-10-CM | POA: Diagnosis not present

## 2021-12-23 DIAGNOSIS — R7301 Impaired fasting glucose: Secondary | ICD-10-CM | POA: Diagnosis not present

## 2021-12-23 DIAGNOSIS — K5730 Diverticulosis of large intestine without perforation or abscess without bleeding: Secondary | ICD-10-CM | POA: Diagnosis not present

## 2021-12-23 DIAGNOSIS — E785 Hyperlipidemia, unspecified: Secondary | ICD-10-CM | POA: Diagnosis not present

## 2021-12-23 DIAGNOSIS — Z0001 Encounter for general adult medical examination with abnormal findings: Secondary | ICD-10-CM | POA: Diagnosis not present

## 2021-12-23 DIAGNOSIS — R911 Solitary pulmonary nodule: Secondary | ICD-10-CM | POA: Diagnosis not present

## 2021-12-23 DIAGNOSIS — D72829 Elevated white blood cell count, unspecified: Secondary | ICD-10-CM | POA: Diagnosis not present

## 2021-12-30 ENCOUNTER — Other Ambulatory Visit: Payer: Self-pay

## 2021-12-30 ENCOUNTER — Encounter: Payer: Self-pay | Admitting: Cardiovascular Disease

## 2021-12-30 ENCOUNTER — Ambulatory Visit: Payer: Medicare Other | Admitting: Cardiovascular Disease

## 2021-12-30 DIAGNOSIS — G4733 Obstructive sleep apnea (adult) (pediatric): Secondary | ICD-10-CM

## 2021-12-30 DIAGNOSIS — E782 Mixed hyperlipidemia: Secondary | ICD-10-CM

## 2021-12-30 DIAGNOSIS — I2510 Atherosclerotic heart disease of native coronary artery without angina pectoris: Secondary | ICD-10-CM | POA: Diagnosis not present

## 2021-12-30 DIAGNOSIS — I10 Essential (primary) hypertension: Secondary | ICD-10-CM

## 2021-12-30 DIAGNOSIS — Z9861 Coronary angioplasty status: Secondary | ICD-10-CM | POA: Diagnosis not present

## 2021-12-30 NOTE — Assessment & Plan Note (Signed)
History of hyperlipidemia on statin therapy with lipid profile performed 06/18/2021 revealing a total cholesterol 112, LDL 59 HDL 39

## 2021-12-30 NOTE — Assessment & Plan Note (Signed)
History of obstructive sleep apnea on CPAP which he wears and benefits from

## 2021-12-30 NOTE — Assessment & Plan Note (Signed)
History of morbid obesity with a BMI of 46.  I am referring him to the [facility] for assistance in weight loss.
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
## 2021-12-30 NOTE — Patient Instructions (Signed)

## 2021-12-30 NOTE — Assessment & Plan Note (Signed)
History of CAD status post LAD PCI drug-eluting stenting using an Endeavor drug-eluting stent (3 mm x 18 mm) by Dr. Aldona Bar November 2010.  His circumflex and RCA were free of significant disease.  He had a Myoview stress test performed in 2013 that was nonischemic.  He currently denies chest pain or shortness of breath.

## 2021-12-30 NOTE — Assessment & Plan Note (Signed)
History of essential hypertension blood pressure measured today of 128/82.  He is on losartan.

## 2022-01-05 ENCOUNTER — Ambulatory Visit (INDEPENDENT_AMBULATORY_CARE_PROVIDER_SITE_OTHER): Payer: Medicare Other | Admitting: Gastroenterology

## 2022-03-30 DIAGNOSIS — I10 Essential (primary) hypertension: Secondary | ICD-10-CM | POA: Diagnosis not present

## 2022-03-30 DIAGNOSIS — M4316 Spondylolisthesis, lumbar region: Secondary | ICD-10-CM | POA: Diagnosis not present

## 2022-03-31 ENCOUNTER — Other Ambulatory Visit: Payer: Self-pay | Admitting: Neurosurgery

## 2022-03-31 DIAGNOSIS — M4316 Spondylolisthesis, lumbar region: Secondary | ICD-10-CM

## 2022-04-02 ENCOUNTER — Other Ambulatory Visit: Payer: Self-pay | Admitting: Cardiovascular Disease

## 2022-04-02 ENCOUNTER — Encounter (INDEPENDENT_AMBULATORY_CARE_PROVIDER_SITE_OTHER): Payer: Self-pay | Admitting: Gastroenterology

## 2022-04-02 ENCOUNTER — Ambulatory Visit (INDEPENDENT_AMBULATORY_CARE_PROVIDER_SITE_OTHER): Payer: Medicare Other | Admitting: Gastroenterology

## 2022-04-02 DIAGNOSIS — R197 Diarrhea, unspecified: Secondary | ICD-10-CM | POA: Diagnosis not present

## 2022-04-02 DIAGNOSIS — R109 Unspecified abdominal pain: Secondary | ICD-10-CM

## 2022-04-02 NOTE — Patient Instructions (Signed)
Perform stool workup ?Can take Tylenol for pain ?May consider CT abdomen pelvis if persistent pain and negative testing in stool ?
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
## 2022-04-07 ENCOUNTER — Other Ambulatory Visit (INDEPENDENT_AMBULATORY_CARE_PROVIDER_SITE_OTHER): Payer: Self-pay | Admitting: Gastroenterology

## 2022-04-07 DIAGNOSIS — R197 Diarrhea, unspecified: Secondary | ICD-10-CM

## 2022-04-07 LAB — GASTROINTESTINAL PATHOGEN PNL

## 2022-04-07 LAB — C. DIFFICILE GDH AND TOXIN A/B
GDH ANTIGEN: NOT DETECTED
MICRO NUMBER:: 13233038
SPECIMEN QUALITY:: ADEQUATE
TOXIN A AND B: NOT DETECTED

## 2022-04-16 ENCOUNTER — Other Ambulatory Visit (INDEPENDENT_AMBULATORY_CARE_PROVIDER_SITE_OTHER): Payer: Self-pay | Admitting: Gastroenterology

## 2022-04-16 ENCOUNTER — Other Ambulatory Visit (INDEPENDENT_AMBULATORY_CARE_PROVIDER_SITE_OTHER): Payer: Self-pay

## 2022-04-16 ENCOUNTER — Telehealth (INDEPENDENT_AMBULATORY_CARE_PROVIDER_SITE_OTHER): Payer: Self-pay

## 2022-04-16 DIAGNOSIS — R197 Diarrhea, unspecified: Secondary | ICD-10-CM

## 2022-04-16 LAB — GASTROINTESTINAL PATHOGEN PNL

## 2022-04-16 NOTE — Telephone Encounter (Signed)
I called and left patient a message with his spouse asked that she please have the patient return call to the office.  ? ?GI path panel was cancelled as per [facility] the specimen was spilled in the bag and was unable to be tested. Per Dr. Jenetta Downer have patient re collect. (Orders printed for [facility]). ?

## 2022-04-16 NOTE — Telephone Encounter (Signed)
Thanks

## 2022-04-16 NOTE — Telephone Encounter (Signed)
Patient aware the specimen was spilled and was unable to be used to run the test. He states he will have this recollected at [facility] and did not want to deal with [facility]. I have placed the orders and the patient says he will go by [facility] [facility] this week to pick up the containers so he can collect.  ?

## 2022-04-17 DIAGNOSIS — R197 Diarrhea, unspecified: Secondary | ICD-10-CM | POA: Diagnosis not present

## 2022-04-18 ENCOUNTER — Other Ambulatory Visit: Payer: Medicare Other

## 2022-04-21 ENCOUNTER — Ambulatory Visit
Admission: RE | Admit: 2022-04-21 | Discharge: 2022-04-21 | Disposition: A | Discharge status: Home / Self Care | Payer: Medicare Other | Source: Ambulatory Visit | Attending: Neurosurgery | Admitting: Neurosurgery

## 2022-04-21 DIAGNOSIS — M4316 Spondylolisthesis, lumbar region: Secondary | ICD-10-CM

## 2022-04-21 DIAGNOSIS — M5126 Other intervertebral disc displacement, lumbar region: Secondary | ICD-10-CM | POA: Diagnosis not present

## 2022-04-21 DIAGNOSIS — M48061 Spinal stenosis, lumbar region without neurogenic claudication: Secondary | ICD-10-CM | POA: Diagnosis not present

## 2022-04-21 DIAGNOSIS — M4807 Spinal stenosis, lumbosacral region: Secondary | ICD-10-CM | POA: Diagnosis not present

## 2022-04-21 MED ORDER — GADOBENATE DIMEGLUMINE 529 MG/ML IV SOLN
20.0000 mL | Freq: Once | INTRAVENOUS | Status: AC | PRN
  Administered 2022-04-21: 20 mL via INTRAVENOUS

## 2022-04-22 ENCOUNTER — Other Ambulatory Visit (INDEPENDENT_AMBULATORY_CARE_PROVIDER_SITE_OTHER): Payer: Self-pay | Admitting: Gastroenterology

## 2022-04-22 DIAGNOSIS — K529 Noninfective gastroenteritis and colitis, unspecified: Secondary | ICD-10-CM

## 2022-04-22 LAB — GI PROFILE, STOOL, PCR
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
## 2022-04-22 NOTE — Addendum Note (Signed)
Addended by: Harvel Quale on: 04/22/2022 04:21 PM ? ? Modules accepted: Orders ? ?

## 2022-04-23 ENCOUNTER — Encounter (INDEPENDENT_AMBULATORY_CARE_PROVIDER_SITE_OTHER): Payer: Self-pay

## 2022-04-23 ENCOUNTER — Telehealth (INDEPENDENT_AMBULATORY_CARE_PROVIDER_SITE_OTHER): Payer: Self-pay

## 2022-04-23 ENCOUNTER — Other Ambulatory Visit (INDEPENDENT_AMBULATORY_CARE_PROVIDER_SITE_OTHER): Payer: Self-pay

## 2022-04-23 DIAGNOSIS — K529 Noninfective gastroenteritis and colitis, unspecified: Secondary | ICD-10-CM | POA: Insufficient documentation

## 2022-04-23 DIAGNOSIS — M4316 Spondylolisthesis, lumbar region: Secondary | ICD-10-CM | POA: Diagnosis not present

## 2022-04-23 MED ORDER — PEG 3350-KCL-NA BICARB-NACL 420 G PO SOLR: 4000.0000 mL | ORAL | 0 refills | Status: DC

## 2022-04-23 NOTE — Telephone Encounter (Signed)
[name] [name], CMA  ?

## 2022-04-23 NOTE — Telephone Encounter (Signed)
Antonio Colon, CMA  ?

## 2022-04-24 DIAGNOSIS — K529 Noninfective gastroenteritis and colitis, unspecified: Secondary | ICD-10-CM | POA: Diagnosis not present

## 2022-04-28 LAB — CELIAC DISEASE PANEL
Endomysial IgA: NEGATIVE
IgA/Immunoglobulin A, Serum: 296 mg/dL (ref 61–437)
Transglutaminase IgA: <2 U/mL (ref 0–3)

## 2022-04-28 LAB — TSH: TSH: 1.73 u[IU]/mL (ref 0.450–4.500)

## 2022-04-29 ENCOUNTER — Encounter (INDEPENDENT_AMBULATORY_CARE_PROVIDER_SITE_OTHER): Payer: Self-pay

## 2022-05-11 ENCOUNTER — Telehealth (INDEPENDENT_AMBULATORY_CARE_PROVIDER_SITE_OTHER): Payer: Self-pay

## 2022-05-11 NOTE — Telephone Encounter (Signed)
Patient called this am stating he is continuing to have RUQ pain that goes through to his back. He says he had a Ct scan back last June 07, which showed some gallstones. He is wanting to know if this could be causing his issues with the diarrhea, pain in RUQ and back pain. He says he has a family history of several people having to have gallbladder surgery. He is wanting you to look at the ct abd from 06/03/2021, and see if this could be the culprit. Please advise. Patient is aware Doctor out of office until next week.  ?

## 2022-05-12 NOTE — Telephone Encounter (Signed)
Patient aware you will review once back in the office.  ?

## 2022-05-18 DIAGNOSIS — C439 Malignant melanoma of skin, unspecified: Secondary | ICD-10-CM | POA: Diagnosis not present

## 2022-05-18 NOTE — Telephone Encounter (Signed)
Spoke with patient and made him aware.

## 2022-05-18 NOTE — Telephone Encounter (Signed)
I reviewed the images and there was a very little gallstone in the CT scan but there were no gallstones in his most recent MRI which is reassuring.  Even if there were very little gallstones, these should not cause diarrhea and persistent abdominal pain, so I would not blame the symptoms on these lesions.  Maylon Peppers, MD Gastroenterology and Hepatology [facility]

## 2022-05-27 NOTE — Patient Instructions (Signed)
Antonio Colon  05/27/2022     '@PREFPERIOPPHARMACY'$ @   Your procedure is scheduled on  06/02/2022.   Report to [facility] at  0600 A.M.   Call this number if you have problems the morning of surgery:  [phone]   Remember:  Follow the diet and prep instructions given to you by the office.    Take these medicines the morning of surgery with A SIP OF WATER                                                 bystolic.     Do not wear jewelry, make-up or nail polish.  Do not wear lotions, powders, or perfumes, or deodorant.  Do not shave 48 hours prior to surgery.  Men may shave face and neck.  Do not bring valuables to the hospital.  [facility] is not responsible for any belongings or valuables.  Contacts, dentures or bridgework may not be worn into surgery.  Leave your suitcase in the car.  After surgery it may be brought to your room.  For patients admitted to the hospital, discharge time will be determined by your treatment team.  Patients discharged the day of surgery will not be allowed to drive home and must  have someone with them for 24 hours.    Special instructions:   DO NOT smoke tobacco or vape for 24 hours before your procedure.  Please read over the following fact sheets that you were given. Anesthesia Post-op Instructions and Care and Recovery After Surgery      Colonoscopy, Adult, Care After The following information offers guidance on how to care for yourself after your procedure. Your health care provider may also give you more specific instructions. If you have problems or questions, contact your health care provider. What can I expect after the procedure? After the procedure, it is common to have: A small amount of blood in your stool for 24 hours after the procedure. Some gas. Mild cramping or bloating of your abdomen. Follow these instructions at home: Eating and drinking  Drink enough fluid to keep your urine pale yellow. Follow  instructions from your health care provider about eating or drinking restrictions. Resume your normal diet as told by your health care provider. Avoid heavy or fried foods that are hard to digest. Activity Rest as told by your health care provider. Avoid sitting for a long time without moving. Get up to take short walks every 1-2 hours. This is important to improve blood flow and breathing. Ask for help if you feel weak or unsteady. Return to your normal activities as told by your health care provider. Ask your health care provider what activities are safe for you. Managing cramping and bloating  Try walking around when you have cramps or feel bloated. If directed, apply heat to your abdomen as told by your health care provider. Use the heat source that your health care provider recommends, such as a moist heat pack or a heating pad. Place a towel between your skin and the heat source. Leave the heat on for 20-30 minutes. Remove the heat if your skin turns bright red. This is especially important if you are unable to feel pain, heat, or cold. You have a greater risk of getting burned. General instructions If you were given  a sedative during the procedure, it can affect you for several hours. Do not drive or operate machinery until your health care provider says that it is safe. For the first 24 hours after the procedure: Do not sign important documents. Do not drink alcohol. Do your regular daily activities at a slower pace than normal. Eat soft foods that are easy to digest. Take over-the-counter and prescription medicines only as told by your health care provider. Keep all follow-up visits. This is important. Contact a health care provider if: You have blood in your stool 2-3 days after the procedure. Get help right away if: You have more than a small spotting of blood in your stool. You have large blood clots in your stool. You have swelling of your abdomen. You have nausea or  vomiting. You have a fever. You have increasing pain in your abdomen that is not relieved with medicine. These symptoms may be an emergency. Get help right away. Call 911. Do not wait to see if the symptoms will go away. Do not drive yourself to the hospital. Summary After the procedure, it is common to have a small amount of blood in your stool. You may also have mild cramping and bloating of your abdomen. If you were given a sedative during the procedure, it can affect you for several hours. Do not drive or operate machinery until your health care provider says that it is safe. Get help right away if you have a lot of blood in your stool, nausea or vomiting, a fever, or increased pain in your abdomen. This information is not intended to replace advice given to you by your health care provider. Make sure you discuss any questions you have with your health care provider. Document Revised: 08/06/2021 Document Reviewed: 08/06/2021 Elsevier Patient Education  [place] After This sheet gives you information about how to care for yourself after your procedure. Your health care provider may also give you more specific instructions. If you have problems or questions, contact your health care provider. What can I expect after the procedure? After the procedure, it is common to have: Tiredness. Forgetfulness about what happened after the procedure. Impaired judgment for important decisions. Nausea or vomiting. Some difficulty with balance. Follow these instructions at home: For the time period you were told by your health care provider:     Rest as needed. Do not participate in activities where you could fall or become injured. Do not drive or use machinery. Do not drink alcohol. Do not take sleeping pills or medicines that cause drowsiness. Do not make important decisions or sign legal documents. Do not take care of children on your own. Eating and  drinking Follow the diet that is recommended by your health care provider. Drink enough fluid to keep your urine pale yellow. If you vomit: Drink water, juice, or soup when you can drink without vomiting. Make sure you have little or no nausea before eating solid foods. General instructions Have a responsible adult stay with you for the time you are told. It is important to have someone help care for you until you are awake and alert. Take over-the-counter and prescription medicines only as told by your health care provider. If you have sleep apnea, surgery and certain medicines can increase your risk for breathing problems. Follow instructions from your health care provider about wearing your sleep device: Anytime you are sleeping, including during daytime naps. While taking prescription pain medicines, sleeping medicines, or  medicines that make you drowsy. Avoid smoking. Keep all follow-up visits as told by your health care provider. This is important. Contact a health care provider if: You keep feeling nauseous or you keep vomiting. You feel light-headed. You are still sleepy or having trouble with balance after 24 hours. You develop a rash. You have a fever. You have redness or swelling around the IV site. Get help right away if: You have trouble breathing. You have new-onset confusion at home. Summary For several hours after your procedure, you may feel tired. You may also be forgetful and have poor judgment. Have a responsible adult stay with you for the time you are told. It is important to have someone help care for you until you are awake and alert. Rest as told. Do not drive or operate machinery. Do not drink alcohol or take sleeping pills. Get help right away if you have trouble breathing, or if you suddenly become confused. This information is not intended to replace advice given to you by your health care provider. Make sure you discuss any questions you have with your  health care provider. Document Revised: 11/18/2021 Document Reviewed: 11/16/2019 Elsevier Patient Education  [place].

## 2022-05-28 ENCOUNTER — Encounter (HOSPITAL_COMMUNITY): Payer: Self-pay | Admitting: Gastroenterology

## 2022-05-28 ENCOUNTER — Encounter (HOSPITAL_COMMUNITY)
Admission: RE | Admit: 2022-05-28 | Discharge: 2022-05-28 | Disposition: A | Discharge status: Home / Self Care | Payer: Medicare Other | Source: Ambulatory Visit | Attending: Gastroenterology | Admitting: Gastroenterology

## 2022-06-02 ENCOUNTER — Ambulatory Visit (HOSPITAL_COMMUNITY)
Admission: RE | Admit: 2022-06-02 | Discharge: 2022-06-02 | Disposition: A | Discharge status: Home / Self Care | Payer: Medicare Other | Attending: Gastroenterology | Admitting: Gastroenterology

## 2022-06-02 ENCOUNTER — Encounter (HOSPITAL_COMMUNITY): Payer: Self-pay | Admitting: Gastroenterology

## 2022-06-02 ENCOUNTER — Encounter (HOSPITAL_COMMUNITY)
Admission: RE | Disposition: A | Discharge status: Home / Self Care | Payer: Self-pay | Source: Home / Self Care | Attending: Gastroenterology

## 2022-06-02 ENCOUNTER — Ambulatory Visit (HOSPITAL_BASED_OUTPATIENT_CLINIC_OR_DEPARTMENT_OTHER): Payer: Medicare Other | Admitting: Anesthesiology

## 2022-06-02 ENCOUNTER — Encounter (INDEPENDENT_AMBULATORY_CARE_PROVIDER_SITE_OTHER): Payer: Self-pay | Admitting: *Deleted

## 2022-06-02 ENCOUNTER — Ambulatory Visit (HOSPITAL_COMMUNITY): Payer: Medicare Other | Admitting: Anesthesiology

## 2022-06-02 DIAGNOSIS — G4730 Sleep apnea, unspecified: Secondary | ICD-10-CM | POA: Insufficient documentation

## 2022-06-02 DIAGNOSIS — K648 Other hemorrhoids: Secondary | ICD-10-CM

## 2022-06-02 DIAGNOSIS — R197 Diarrhea, unspecified: Secondary | ICD-10-CM

## 2022-06-02 DIAGNOSIS — R109 Unspecified abdominal pain: Secondary | ICD-10-CM | POA: Diagnosis not present

## 2022-06-02 DIAGNOSIS — K621 Rectal polyp: Secondary | ICD-10-CM | POA: Insufficient documentation

## 2022-06-02 DIAGNOSIS — I10 Essential (primary) hypertension: Secondary | ICD-10-CM | POA: Insufficient documentation

## 2022-06-02 DIAGNOSIS — E785 Hyperlipidemia, unspecified: Secondary | ICD-10-CM | POA: Insufficient documentation

## 2022-06-02 DIAGNOSIS — D122 Benign neoplasm of ascending colon: Secondary | ICD-10-CM | POA: Insufficient documentation

## 2022-06-02 DIAGNOSIS — K5730 Diverticulosis of large intestine without perforation or abscess without bleeding: Secondary | ICD-10-CM

## 2022-06-02 DIAGNOSIS — D1809 Hemangioma of other sites: Secondary | ICD-10-CM | POA: Insufficient documentation

## 2022-06-02 DIAGNOSIS — E669 Obesity, unspecified: Secondary | ICD-10-CM | POA: Diagnosis not present

## 2022-06-02 DIAGNOSIS — I252 Old myocardial infarction: Secondary | ICD-10-CM | POA: Insufficient documentation

## 2022-06-02 DIAGNOSIS — K635 Polyp of colon: Secondary | ICD-10-CM | POA: Diagnosis not present

## 2022-06-02 DIAGNOSIS — K529 Noninfective gastroenteritis and colitis, unspecified: Secondary | ICD-10-CM | POA: Insufficient documentation

## 2022-06-02 DIAGNOSIS — I2510 Atherosclerotic heart disease of native coronary artery without angina pectoris: Secondary | ICD-10-CM | POA: Diagnosis not present

## 2022-06-02 DIAGNOSIS — Z87891 Personal history of nicotine dependence: Secondary | ICD-10-CM | POA: Insufficient documentation

## 2022-06-02 HISTORY — PX: BIOPSY: SHX5522

## 2022-06-02 HISTORY — PX: POLYPECTOMY: SHX5525

## 2022-06-02 HISTORY — PX: COLONOSCOPY WITH PROPOFOL: SHX5780

## 2022-06-02 LAB — HM COLONOSCOPY

## 2022-06-02 SURGERY — COLONOSCOPY WITH PROPOFOL
Anesthesia: General

## 2022-06-02 MED ORDER — DICYCLOMINE HCL 10 MG PO CAPS: 10.0000 mg | ORAL_CAPSULE | Freq: Two times a day (BID) | ORAL | 2 refills | Status: DC | PRN

## 2022-06-02 MED ORDER — LACTATED RINGERS IV SOLN: INTRAVENOUS | Status: DC

## 2022-06-02 MED ORDER — PROPOFOL 500 MG/50ML IV EMUL
INTRAVENOUS | Status: AC
  Filled 2022-06-02: qty 100

## 2022-06-02 MED ORDER — PROPOFOL 10 MG/ML IV BOLUS
INTRAVENOUS | Status: DC | PRN
  Administered 2022-06-02: 50 mg via INTRAVENOUS
  Administered 2022-06-02: 20 mg via INTRAVENOUS

## 2022-06-02 MED ORDER — LIDOCAINE HCL 1 % IJ SOLN
INTRAMUSCULAR | Status: DC | PRN
  Administered 2022-06-02: 50 mg via INTRADERMAL

## 2022-06-02 MED ORDER — PROPOFOL 500 MG/50ML IV EMUL
INTRAVENOUS | Status: DC | PRN
  Administered 2022-06-02: 150 ug/kg/min via INTRAVENOUS

## 2022-06-02 NOTE — Anesthesia Preprocedure Evaluation (Signed)
Anesthesia Evaluation  Patient identified by MRN, date of birth, ID band Patient awake    Reviewed: Allergy & Precautions, H&P , NPO status , Patient's Chart, lab work & pertinent test results, reviewed documented beta blocker date and time   Airway Mallampati: II  TM Distance: >3 FB Neck ROM: full    Dental no notable dental hx.    Pulmonary sleep apnea , former smoker,    Pulmonary exam normal breath sounds clear to auscultation       Cardiovascular Exercise Tolerance: Good hypertension, negative cardio ROS   Rhythm:regular Rate:Normal     Neuro/Psych  Neuromuscular disease negative psych ROS   GI/Hepatic Neg liver ROS, hiatal hernia, GERD  Medicated,  Endo/Other  negative endocrine ROS  Renal/GU negative Renal ROS  negative genitourinary   Musculoskeletal   Abdominal   Peds  Hematology negative hematology ROS (+)   Anesthesia Other Findings   Reproductive/Obstetrics negative OB ROS                             Anesthesia Physical Anesthesia Plan  ASA: 3  Anesthesia Plan: General   Post-op Pain Management:    Induction:   PONV Risk Score and Plan: Propofol infusion  Airway Management Planned:   Additional Equipment:   Intra-op Plan:   Post-operative Plan:   Informed Consent: I have reviewed the patients History and Physical, chart, labs and discussed the procedure including the risks, benefits and alternatives for the proposed anesthesia with the patient or authorized representative who has indicated his/her understanding and acceptance.     Dental Advisory Given  Plan Discussed with: CRNA  Anesthesia Plan Comments:         Anesthesia Quick Evaluation

## 2022-06-02 NOTE — Transfer of Care (Signed)
Immediate Anesthesia Transfer of Care Note  Patient: Antonio Colon  Procedure(s) Performed: COLONOSCOPY WITH PROPOFOL BIOPSY POLYPECTOMY  Patient Location: Short Stay  Anesthesia Type:General  Level of Consciousness: awake  Airway & Oxygen Therapy: Patient Spontanous Breathing  Post-op Assessment: Report given to RN  Post vital signs: Reviewed and stable  Last Vitals:  Vitals Value Taken Time  BP    Temp    Pulse    Resp    SpO2      Last Pain:  Vitals:   06/02/22 0734  TempSrc:   PainSc: 0-No pain      Patients Stated Pain Goal: 5 (42/10/31 2811)  Complications: No notable events documented.

## 2022-06-02 NOTE — H&P (Signed)
Antonio Colon is an 71 y.o. male.   Chief Complaint: diarrhea and abdominal pain HPI: Antonio Colon is a 71 y.o. male with past medical history of coronary artery disease, GERD, hyperlipidemia, hypertension, history of MI, obesity, sleep apnea, hepatic hemangioma, , who presents for evaluation of abdominal pain and diarrhea.  Patient reports that since early April he has presented recurrent episodes of diarrhea multiple episodes per day without melena or hematochezia.  Has not present any fecal incontinence or fecal soiling.  Also has presented some right upper quadrant pain and right flank pain.  Past Medical History:  Diagnosis Date   CAD (coronary artery disease)    stents   Fracture, rib 05/2021   right side   GERD (gastroesophageal reflux disease)    Heart disease    Heart murmur    childhood   Hiatal hernia    History of stress test 01/27/2012   Normal study. No significant ischemia demonstration, this low risk scan, no significant change compared to previous study.   Hypercholesteremia    Hyperlipidemia    Hypertension    Myocardial infarction ([place]) 2010   Obesity    Pneumonia 2019   Sleep apnea    cpap at night    Past Surgical History:  Procedure Laterality Date   AXILLARY LYMPH NODE DISSECTION Right 07/2021   BACK SURGERY  10/2019   Dr. Christella Noa: right L4-5 laminectomy   CARDIAC CATHETERIZATION  10/2009   CAD stents to the LAD using an [employer] drug eluting (3x67m) by Dr LRex Kras he had normal circumflex and RCA at the time.   COLONOSCOPY  06/15/2012   COLONOSCOPY N/A 01/09/2019   Procedure: COLONOSCOPY;  Surgeon: RRogene Houston MD;  [facility]: [facility];  Service: Endoscopy;  Laterality: N/A;  2:10pm   ESOPHAGOGASTRODUODENOSCOPY N/A 11/15/2015   Procedure: ESOPHAGOGASTRODUODENOSCOPY (EGD);  Surgeon: NRogene Houston MD;  Location: [facility];  Service: Endoscopy;  Laterality: N/A;  1:10 - moved to 12:55 - Ann notified pt   MELANOMA EXCISION  07/2021   back    POLYPECTOMY  01/09/2019   Procedure: POLYPECTOMY;  Surgeon: RRogene Houston MD;  Location: [facility];  Service: Endoscopy;;  colon   TONSILLECTOMY      Family History  Problem Relation Age of Onset   Heart disease Mother    Healthy Sister    Social History:  reports that he quit smoking about 13 years ago. His smoking use included cigarettes and cigars. He has never used smokeless tobacco. He reports that he does not drink alcohol and does not use drugs.  Allergies: No Known Allergies  Medications Prior to Admission  Medication Sig Dispense Refill   acetaminophen (TYLENOL) 650 MG CR tablet Take 1,300 mg by mouth in the morning and at bedtime.     ALPRAZolam (XANAX) 0.25 MG tablet Take 0.25 mg by mouth at bedtime.     Ascorbic Acid (VITAMIN C PO) Take 1 tablet by mouth daily.     aspirin EC 81 MG tablet Take 1 tablet (81 mg total) by mouth daily.     losartan-hydrochlorothiazide (HYZAAR) 100-12.5 MG tablet Take 1 tablet by mouth daily.     Multiple Vitamin (MULTIVITAMIN WITH MINERALS) TABS tablet Take 1 tablet by mouth daily.     MYRBETRIQ 50 MG TB24 tablet Take 50 mg by mouth daily.      nebivolol (BYSTOLIC) 5 MG tablet TAKE ONE TABLET BY MOUTH ONCE DAILY. SCHEADULE APPOINTMENT WITH DR.BERRY FOR REFILLS 30  tablet 2   polyethylene glycol-electrolytes (TRILYTE) 420 g solution Take 4,000 mLs by mouth as directed. 4000 mL 0   rosuvastatin (CRESTOR) 5 MG tablet TAKE 1 TABLET BY MOUTH DAILY AT 6PM FOR CHOLESTEROL. 90 tablet 3   ATIVAN 0.5 MG tablet Take 0.5 tablets (0.25 mg total) by mouth every 30 (thirty) minutes as needed for anxiety. 1st dose 30 minutes prior to MRI, repeat if needed. (Patient not taking: Reported on 05/20/2022) 1 tablet 0   nitroGLYCERIN (NITROSTAT) 0.4 MG SL tablet Place 1 tablet (0.4 mg total) under the tongue every 5 (five) minutes as needed. For chest pain 25 tablet 11    No results found for this or any previous visit (from the past 48 hour(s)). No results  found.  Review of Systems  Constitutional: Negative.   HENT: Negative.    Eyes: Negative.   Respiratory: Negative.    Cardiovascular: Negative.   Gastrointestinal:  Positive for abdominal pain and diarrhea.  Endocrine: Negative.   Genitourinary: Negative.   Musculoskeletal: Negative.   Skin: Negative.   Allergic/Immunologic: Negative.   Neurological: Negative.   Hematological: Negative.   Psychiatric/Behavioral: Negative.     Blood pressure 133/68, pulse 72, temperature 98.3 F (36.8 C), temperature source Oral, resp. rate 18, SpO2 (!) 85 %. Physical Exam  GENERAL: The patient is AO x3, in no acute distress. HEENT: Head is normocephalic and atraumatic. EOMI are intact. Mouth is well hydrated and without lesions. NECK: Supple. No masses LUNGS: Clear to auscultation. No presence of rhonchi/wheezing/rales. Adequate chest expansion HEART: RRR, normal s1 and s2. ABDOMEN: tender in upper abdomen, no guarding, no peritoneal signs, and nondistended. BS +. No masses. EXTREMITIES: Without any cyanosis, clubbing, rash, lesions or edema. NEUROLOGIC: AOx3, no focal motor deficit. SKIN: no jaundice, no rashes  Assessment/Plan Antonio Colon is a 71 y.o. male with past medical history of coronary artery disease, GERD, hyperlipidemia, hypertension, history of MI, obesity, sleep apnea, hepatic hemangioma, , who presents for evaluation of abdominal pain and diarrhea. Will proceed with Egd and colonoscopy  Harvel Quale, MD 06/02/2022, 7:28 AM

## 2022-06-02 NOTE — Op Note (Addendum)
[facility] Patient Name: Antonio Colon Procedure Date: 06/02/2022 7:08 AM MRN: [id number] Date of Birth: [birth year] Attending MD: Maylon Peppers ,  CSN: [id number] Age: 71 Admit Type: Outpatient Procedure:                Colonoscopy Indications:              Abdominal pain, Clinically significant diarrhea of                            unexplained origin Providers:                Maylon Peppers, Janeece Riggers, RN, Kristine L.                            Risa Grill, Technician Referring MD:              Medicines:                Monitored Anesthesia Care Complications:            No immediate complications. Estimated Blood Loss:     Estimated blood loss: none. Procedure:                Pre-Anesthesia Assessment:                           - Prior to the procedure, a History and Physical                            was performed, and patient medications, allergies                            and sensitivities were reviewed. The patient's                            tolerance of previous anesthesia was reviewed.                           - The risks and benefits of the procedure and the                            sedation options and risks were discussed with the                            patient. All questions were answered and informed                            consent was obtained.                           - ASA Grade Assessment: III - A patient with severe                            systemic disease.                           After obtaining informed consent, the colonoscope  was passed under direct vision. Throughout the                            procedure, the patient's blood pressure, pulse, and                            oxygen saturations were monitored continuously. The                            [id number]-HQ190L ([id number]) scope was introduced through                            the anus and advanced to the the cecum, identified                             by appendiceal orifice and ileocecal valve. The                            colonoscopy was performed without difficulty. The                            patient tolerated the procedure well. The quality                            of the bowel preparation was adequate to identify                            polyps 6 mm and larger in size. Scope In: 7:37:47 AM Scope Out: 8:09:22 AM Scope Withdrawal Time: 0 hours 24 minutes 52 seconds  Total Procedure Duration: 0 hours 31 minutes 35 seconds  Findings:      The perianal and digital rectal examinations were normal.      Three sessile polyps were found in the transverse colon and ascending       colon. The polyps were 3 to 5 mm in size. These polyps were removed with       a cold snare. Resection and retrieval were complete.      A 2 mm polyp was found in the ascending colon. The polyp was sessile.       The polyp was removed with a cold biopsy forceps. Resection and       retrieval were complete.      A 2 mm polyp was found in the rectum. The polyp was sessile. The polyp       was removed with a cold biopsy forceps. Resection and retrieval were       complete.      Multiple small and large-mouthed diverticula were found in the sigmoid       colon. Biopsies for histology were taken from the normal colon with a       cold forceps from the right colon and left colon for evaluation of       microscopic colitis.      Non-bleeding internal hemorrhoids were found during retroflexion. The       hemorrhoids were small. Impression:               - Three 3 to 5 mm polyps in  the transverse colon                            and in the ascending colon, removed with a cold                            snare. Resected and retrieved.                           - One 2 mm polyp in the ascending colon, removed                            with a cold biopsy forceps. Resected and retrieved.                           - One 2 mm polyp in the rectum, removed with a cold                             biopsy forceps. Resected and retrieved.                           - Diverticulosis in the sigmoid colon. Normal colon                            biopsied.                           - Non-bleeding internal hemorrhoids. Moderate Sedation:      Per Anesthesia Care Recommendation:           - Discharge patient to home (ambulatory).                           - Resume previous diet.                           - Await pathology results.                           - Repeat colonoscopy in 3 years for surveillance.                           - Can take Bentyl as needed for abdominal pain.                           - Can take Imodium as needed for diarrhea. Procedure Code(s):        --- Professional ---                           [phone], Colonoscopy, flexible; with removal of                            tumor(s), polyp(s), or other lesion(s) by snare                            technique  16109, 59, Colonoscopy, flexible; with biopsy,                            single or multiple Diagnosis Code(s):        --- Professional ---                           K63.5, Polyp of colon                           K62.1, Rectal polyp                           K64.8, Other hemorrhoids                           R10.9, Unspecified abdominal pain                           R19.7, Diarrhea, unspecified                           K57.30, Diverticulosis of large intestine without                            perforation or abscess without bleeding CPT copyright 2019 American Medical Association. All rights reserved. The codes documented in this report are preliminary and upon coder review may  be revised to meet current compliance requirements. Maylon Peppers, MD Maylon Peppers,  06/02/2022 8:20:08 AM This report has been signed electronically. Number of Addenda: 0

## 2022-06-02 NOTE — Anesthesia Postprocedure Evaluation (Signed)
Anesthesia Post Note  Patient: Antonio Colon  Procedure(s) Performed: COLONOSCOPY WITH PROPOFOL BIOPSY POLYPECTOMY  Patient location during evaluation: Short Stay Anesthesia Type: General Level of consciousness: awake and alert Pain management: pain level controlled Vital Signs Assessment: post-procedure vital signs reviewed and stable Respiratory status: spontaneous breathing Cardiovascular status: blood pressure returned to baseline and stable Postop Assessment: no apparent nausea or vomiting Anesthetic complications: no   No notable events documented.   Last Vitals:  Vitals:   06/02/22 0633  BP: 133/68  Pulse: 72  Resp: 18  Temp: 36.8 C  SpO2: (!) 85%    Last Pain:  Vitals:   06/02/22 0734  TempSrc:   PainSc: 0-No pain                 Aleli Navedo

## 2022-06-02 NOTE — Discharge Instructions (Signed)
You are being discharged to home.  Resume your previous diet.  We are waiting for your pathology results.  Your physician has recommended a repeat colonoscopy in three years for surveillance.  Can take Bentyl as needed for abdominal pain. Can take Imodium as needed for diarrhea.

## 2022-06-03 ENCOUNTER — Other Ambulatory Visit (INDEPENDENT_AMBULATORY_CARE_PROVIDER_SITE_OTHER): Payer: Self-pay

## 2022-06-03 DIAGNOSIS — R197 Diarrhea, unspecified: Secondary | ICD-10-CM

## 2022-06-03 DIAGNOSIS — K5730 Diverticulosis of large intestine without perforation or abscess without bleeding: Secondary | ICD-10-CM

## 2022-06-03 DIAGNOSIS — K529 Noninfective gastroenteritis and colitis, unspecified: Secondary | ICD-10-CM

## 2022-06-03 DIAGNOSIS — R109 Unspecified abdominal pain: Secondary | ICD-10-CM

## 2022-06-03 DIAGNOSIS — K8020 Calculus of gallbladder without cholecystitis without obstruction: Secondary | ICD-10-CM

## 2022-06-03 DIAGNOSIS — K769 Liver disease, unspecified: Secondary | ICD-10-CM

## 2022-06-03 LAB — SURGICAL PATHOLOGY

## 2022-06-08 ENCOUNTER — Encounter (HOSPITAL_BASED_OUTPATIENT_CLINIC_OR_DEPARTMENT_OTHER): Payer: Self-pay

## 2022-06-08 ENCOUNTER — Ambulatory Visit (HOSPITAL_BASED_OUTPATIENT_CLINIC_OR_DEPARTMENT_OTHER)
Admission: RE | Admit: 2022-06-08 | Discharge: 2022-06-08 | Disposition: A | Discharge status: Home / Self Care | Payer: Medicare Other | Source: Ambulatory Visit | Attending: Gastroenterology | Admitting: Gastroenterology

## 2022-06-08 DIAGNOSIS — K529 Noninfective gastroenteritis and colitis, unspecified: Secondary | ICD-10-CM | POA: Diagnosis not present

## 2022-06-08 DIAGNOSIS — R109 Unspecified abdominal pain: Secondary | ICD-10-CM | POA: Insufficient documentation

## 2022-06-08 DIAGNOSIS — R197 Diarrhea, unspecified: Secondary | ICD-10-CM | POA: Diagnosis not present

## 2022-06-08 DIAGNOSIS — I700 Atherosclerosis of aorta: Secondary | ICD-10-CM | POA: Diagnosis not present

## 2022-06-08 LAB — POCT I-STAT CREATININE: Creatinine, Ser: 1.1 mg/dL (ref 0.61–1.24)

## 2022-06-08 MED ORDER — IOHEXOL 300 MG/ML  SOLN: 100.0000 mL | Freq: Once | INTRAMUSCULAR | Status: DC | PRN

## 2022-06-08 MED ORDER — IOHEXOL 300 MG/ML  SOLN
100.0000 mL | Freq: Once | INTRAMUSCULAR | Status: AC | PRN
  Administered 2022-06-08: 100 mL via INTRAVENOUS

## 2022-06-09 DIAGNOSIS — K8020 Calculus of gallbladder without cholecystitis without obstruction: Secondary | ICD-10-CM | POA: Diagnosis not present

## 2022-06-09 DIAGNOSIS — D485 Neoplasm of uncertain behavior of skin: Secondary | ICD-10-CM | POA: Diagnosis not present

## 2022-06-09 DIAGNOSIS — K5730 Diverticulosis of large intestine without perforation or abscess without bleeding: Secondary | ICD-10-CM | POA: Diagnosis not present

## 2022-06-09 DIAGNOSIS — R109 Unspecified abdominal pain: Secondary | ICD-10-CM | POA: Diagnosis not present

## 2022-06-09 DIAGNOSIS — Z1283 Encounter for screening for malignant neoplasm of skin: Secondary | ICD-10-CM | POA: Diagnosis not present

## 2022-06-09 DIAGNOSIS — D225 Melanocytic nevi of trunk: Secondary | ICD-10-CM | POA: Diagnosis not present

## 2022-06-09 DIAGNOSIS — Z85820 Personal history of malignant melanoma of skin: Secondary | ICD-10-CM | POA: Diagnosis not present

## 2022-06-09 DIAGNOSIS — K769 Liver disease, unspecified: Secondary | ICD-10-CM | POA: Diagnosis not present

## 2022-06-09 DIAGNOSIS — Z08 Encounter for follow-up examination after completed treatment for malignant neoplasm: Secondary | ICD-10-CM | POA: Diagnosis not present

## 2022-06-09 DIAGNOSIS — K529 Noninfective gastroenteritis and colitis, unspecified: Secondary | ICD-10-CM | POA: Diagnosis not present

## 2022-06-10 ENCOUNTER — Encounter (HOSPITAL_COMMUNITY): Payer: Self-pay | Admitting: Gastroenterology

## 2022-06-18 DIAGNOSIS — E785 Hyperlipidemia, unspecified: Secondary | ICD-10-CM | POA: Diagnosis not present

## 2022-06-18 DIAGNOSIS — I10 Essential (primary) hypertension: Secondary | ICD-10-CM | POA: Diagnosis not present

## 2022-06-18 LAB — FECAL FAT, QUALITATIVE
Fat Qual Neutral, Stl: NORMAL
Fat Qual Total, Stl: NORMAL

## 2022-06-18 LAB — PANCREATIC ELASTASE, FECAL: Pancreatic Elastase, Fecal: <50 ug Elast./g — ABNORMAL LOW (ref >200)

## 2022-06-24 DIAGNOSIS — G4733 Obstructive sleep apnea (adult) (pediatric): Secondary | ICD-10-CM | POA: Diagnosis not present

## 2022-06-25 DIAGNOSIS — N3281 Overactive bladder: Secondary | ICD-10-CM | POA: Diagnosis not present

## 2022-06-25 DIAGNOSIS — R911 Solitary pulmonary nodule: Secondary | ICD-10-CM | POA: Diagnosis not present

## 2022-06-25 DIAGNOSIS — Z85828 Personal history of other malignant neoplasm of skin: Secondary | ICD-10-CM | POA: Diagnosis not present

## 2022-06-25 DIAGNOSIS — K589 Irritable bowel syndrome without diarrhea: Secondary | ICD-10-CM | POA: Diagnosis not present

## 2022-06-25 DIAGNOSIS — Z0001 Encounter for general adult medical examination with abnormal findings: Secondary | ICD-10-CM | POA: Diagnosis not present

## 2022-06-26 ENCOUNTER — Emergency Department (HOSPITAL_COMMUNITY): Payer: Medicare Other

## 2022-06-26 ENCOUNTER — Emergency Department (HOSPITAL_COMMUNITY)
Admission: EM | Admit: 2022-06-26 | Discharge: 2022-06-26 | Disposition: A | Discharge status: Home / Self Care | Payer: Medicare Other | Attending: Emergency Medicine | Admitting: Emergency Medicine

## 2022-06-26 ENCOUNTER — Encounter (HOSPITAL_COMMUNITY): Payer: Self-pay

## 2022-06-26 ENCOUNTER — Other Ambulatory Visit: Payer: Self-pay

## 2022-06-26 DIAGNOSIS — R6889 Other general symptoms and signs: Secondary | ICD-10-CM | POA: Diagnosis not present

## 2022-06-26 DIAGNOSIS — Z041 Encounter for examination and observation following transport accident: Secondary | ICD-10-CM | POA: Diagnosis not present

## 2022-06-26 DIAGNOSIS — G4489 Other headache syndrome: Secondary | ICD-10-CM | POA: Diagnosis not present

## 2022-06-26 DIAGNOSIS — Z7982 Long term (current) use of aspirin: Secondary | ICD-10-CM | POA: Insufficient documentation

## 2022-06-26 DIAGNOSIS — S0990XA Unspecified injury of head, initial encounter: Secondary | ICD-10-CM

## 2022-06-26 DIAGNOSIS — R918 Other nonspecific abnormal finding of lung field: Secondary | ICD-10-CM | POA: Diagnosis not present

## 2022-06-26 DIAGNOSIS — S299XXA Unspecified injury of thorax, initial encounter: Secondary | ICD-10-CM | POA: Diagnosis not present

## 2022-06-26 DIAGNOSIS — S29001A Unspecified injury of muscle and tendon of front wall of thorax, initial encounter: Secondary | ICD-10-CM | POA: Diagnosis present

## 2022-06-26 DIAGNOSIS — J984 Other disorders of lung: Secondary | ICD-10-CM | POA: Diagnosis not present

## 2022-06-26 DIAGNOSIS — Z743 Need for continuous supervision: Secondary | ICD-10-CM | POA: Diagnosis not present

## 2022-06-26 DIAGNOSIS — R519 Headache, unspecified: Secondary | ICD-10-CM | POA: Insufficient documentation

## 2022-06-26 DIAGNOSIS — I517 Cardiomegaly: Secondary | ICD-10-CM | POA: Diagnosis not present

## 2022-06-26 DIAGNOSIS — S20212A Contusion of left front wall of thorax, initial encounter: Secondary | ICD-10-CM | POA: Diagnosis not present

## 2022-06-26 DIAGNOSIS — Y92410 Unspecified street and highway as the place of occurrence of the external cause: Secondary | ICD-10-CM | POA: Diagnosis not present

## 2022-06-26 MED ORDER — TRAMADOL HCL 50 MG PO TABS: 50.0000 mg | ORAL_TABLET | Freq: Four times a day (QID) | ORAL | 0 refills | Status: DC | PRN

## 2022-06-26 MED ORDER — ACETAMINOPHEN 500 MG PO TABS
1000.0000 mg | ORAL_TABLET | Freq: Once | ORAL | Status: AC
  Administered 2022-06-26: 1000 mg via ORAL
  Filled 2022-06-26: qty 2
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
## 2022-06-26 NOTE — ED Notes (Signed)
Patient transported to CT 
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
## 2022-07-02 ENCOUNTER — Other Ambulatory Visit: Payer: Self-pay | Admitting: Cardiovascular Disease

## 2022-07-13 DIAGNOSIS — D485 Neoplasm of uncertain behavior of skin: Secondary | ICD-10-CM | POA: Diagnosis not present

## 2022-07-13 DIAGNOSIS — L988 Other specified disorders of the skin and subcutaneous tissue: Secondary | ICD-10-CM | POA: Diagnosis not present

## 2022-08-02 ENCOUNTER — Other Ambulatory Visit: Payer: Self-pay | Admitting: Cardiovascular Disease

## 2022-08-04 NOTE — Telephone Encounter (Signed)
*  STAT* If patient is at the pharmacy, call can be transferred to refill team.   1. Which medications need to be refilled? (please list name of each medication and dose if known) generic Bystolic  2. Which pharmacy/location (including street and city if local pharmacy) is medication to be sent to? [place]  3. Do they need a 30 day or 90 day supply? #30 days and refills- please call today, he is out of his medicine

## 2022-08-04 NOTE — Telephone Encounter (Signed)
Pharmacy is calling to check on the status of prescription being filled due to patient asking about it. Please advise.

## 2022-10-05 ENCOUNTER — Encounter (INDEPENDENT_AMBULATORY_CARE_PROVIDER_SITE_OTHER): Payer: Self-pay | Admitting: Gastroenterology

## 2022-10-05 ENCOUNTER — Ambulatory Visit (INDEPENDENT_AMBULATORY_CARE_PROVIDER_SITE_OTHER): Payer: Medicare Other | Admitting: Gastroenterology

## 2022-10-05 VITALS — BP 126/70 | HR 94 | Temp 98.3°F | Ht 70.0 in | Wt 332.8 lb

## 2022-10-05 DIAGNOSIS — K529 Noninfective gastroenteritis and colitis, unspecified: Secondary | ICD-10-CM | POA: Diagnosis not present

## 2022-10-05 NOTE — Progress Notes (Unsigned)
Maylon Peppers, M.D. Gastroenterology & Hepatology [facility] [address] [place], [place] [address]  Primary Care Physician: Celene Squibb, MD [address] [address]  I will communicate my assessment and recommendations to the referring MD via EMR.  Problems: Acute diarrhea, possibly infectious Liver cyst  History of Present Illness: CALEM COCOZZA is a 71 y.o. male with past medical history of coronary artery disease, GERD, hyperlipidemia, hypertension, history of MI, obesity, sleep apnea, hepatic hemangioma, who presents for follow up of intermittent diarrhea intermittent diarrhea.  The patient was last seen on 04/02/2022. At that time, the patient had C. difficile and GI pathogen panel checked given the acuity of his disease which were negative.  This was followed by a CT of the abdomen pelvis with IV contrast performed on 06/08/2022, which showed diverticulosis and slightly enlarged hepatic cyst, also presence of calcification inside of the gallbladder, possibly stone.  Colonoscopy was performed on 06/02/2022 which showed the findings described below. Subsequently he underwent pancreatic elastase and fecal fat testing, which showed a fecal elastase of less than 50 but normal fecal fat which was not consistent with EPI, especially as his symptoms resolved at the time the results were back.    Patient reports he has had a few flares of diarrhea per week since he saw [place] in clinic. Denies any clear food triggers for his episodes. Was concerned as his most recent flare was during the past weekend and had 3-4 Bms per day, which had watery consistency, today is slightly better. Has not tried taking the dicyclomine so far but has some available at home.  The patient denies having any nausea, vomiting, fever, chills, hematochezia, melena, hematemesis, abdominal distention, abdominal pain,  jaundice, pruritus or weight loss.  Last EGD: Last  Colonoscopy:06/02/22 - Three 3 to 5 mm polyps in the transverse colon and in the ascending colon, removed with a cold snare. Resected and retrieved. - One 2 mm polyp in the ascending colon, removed with a cold biopsy forceps. Resected and retrieved. - One 2 mm polyp in the rectum, removed with a cold biopsy forceps. Resected and retrieved. - Diverticulosis in the sigmoid colon. Normal colon biopsied. - Non-bleeding internal hemorrhoids.  A. COLON, RANDOM, BIOPSY:  -  Colonic mucosa within normal limits.   B. COLON, ASCENDING, POLYPECTOMY:  -  Fragments of low-grade dysplasia/tubular adenomatous epithelium.  -  Fragments of sessile serrated polyp (given the multiplicity of polyps  and fragmented nature of the specimen, it is not morphologically  possible to distinguish separate sessile serrated polyps and tubular  adenomas from possible sessile serrated polyps with dysplasia).   C. RECTUM, POLYPECTOMY:  -  Hyperplastic polyp.   Advise repeat colonoscopy in 3 years.  Past Medical History: Past Medical History:  Diagnosis Date   CAD (coronary artery disease)    stents   Fracture, rib 05/2021   right side   GERD (gastroesophageal reflux disease)    Heart disease    Heart murmur    childhood   Hiatal hernia    History of stress test 01/27/2012   Normal study. No significant ischemia demonstration, this low risk scan, no significant change compared to previous study.   Hypercholesteremia    Hyperlipidemia    Hypertension    Myocardial infarction ([place]) 2010   Obesity    Pneumonia 2019   Sleep apnea    cpap at night    Past Surgical History: Past Surgical History:  Procedure Laterality Date   AXILLARY LYMPH NODE DISSECTION Right 07/2021   BACK SURGERY  10/2019   Dr. Christella Noa: right L4-5 laminectomy   BIOPSY  06/02/2022   Procedure: BIOPSY;  Surgeon: Montez Morita, Quillian Quince, MD;  [facility];  Service: Gastroenterology;;   CARDIAC CATHETERIZATION  10/2009    CAD stents to the LAD using an Endeavor drug eluting (3x81m) by Dr LRex Kras he had normal circumflex and RCA at the time.   COLONOSCOPY  06/15/2012   COLONOSCOPY N/A 01/09/2019   Procedure: COLONOSCOPY;  Surgeon: RRogene Houston MD;  [facility];  Service: Endoscopy;  Laterality: N/A;  2:10pm   COLONOSCOPY WITH PROPOFOL N/A 06/02/2022   Procedure: COLONOSCOPY WITH PROPOFOL;  Surgeon: CHarvel Quale MD;  [facility];  Service: Gastroenterology;  Laterality: N/A;  730 ASA 2   ESOPHAGOGASTRODUODENOSCOPY N/A 11/15/2015   Procedure: ESOPHAGOGASTRODUODENOSCOPY (EGD);  Surgeon: NRogene Houston MD;  [facility];  Service: Endoscopy;  Laterality: N/A;  1:10 - moved to 12:55 - Ann notified pt   MELANOMA EXCISION  07/2021   back   POLYPECTOMY  01/09/2019   Procedure: POLYPECTOMY;  Surgeon: RRogene Houston MD;  [facility];  Service: Endoscopy;;  colon   POLYPECTOMY  06/02/2022   Procedure: POLYPECTOMY;  Surgeon: CMontez Morita DQuillian Quince MD;  [facility];  Service: Gastroenterology;;   TONSILLECTOMY      Family History: Family History  Problem Relation Age of Onset   Heart disease Mother    Healthy Sister     Social History: Social History   Tobacco Use  Smoking Status Former   Types: Cigarettes, Cigars   Quit date: 01/25/2009   Years since quitting: 13.7   Passive exposure: Past  Smokeless Tobacco Never   Social History   Substance and Sexual Activity  Alcohol Use No   Social History   Substance and Sexual Activity  Drug Use No    Allergies: No Known Allergies  Medications: Current Outpatient Medications  Medication Sig Dispense Refill   acetaminophen (TYLENOL) 650 MG CR tablet Take 1,300 mg by mouth in the morning and at bedtime.     ALPRAZolam (XANAX) 0.25 MG tablet Take 0.25 mg by mouth at bedtime.     Ascorbic Acid (VITAMIN C PO) Take 1 tablet by mouth daily.     aspirin EC 81 MG tablet Take 1  tablet (81 mg total) by mouth daily.     dicyclomine (BENTYL) 10 MG capsule Take 1 capsule (10 mg total) by mouth every 12 (twelve) hours as needed for spasms (abdominal pain). 60 capsule 2   losartan-hydrochlorothiazide (HYZAAR) 100-12.5 MG tablet Take 1 tablet by mouth daily.     Multiple Vitamin (MULTIVITAMIN WITH MINERALS) TABS tablet Take 1 tablet by mouth daily.     MYRBETRIQ 50 MG TB24 tablet Take 50 mg by mouth daily.      nebivolol (BYSTOLIC) 5 MG tablet TAKE ONE TABLET BY MOUTH ONCE DAILY. 30 tablet 6   nitroGLYCERIN (NITROSTAT) 0.4 MG SL tablet Place 1 tablet (0.4 mg total) under the tongue every 5 (five) minutes as needed. For chest pain 25 tablet 11   rosuvastatin (CRESTOR) 5 MG tablet TAKE 1 TABLET BY MOUTH DAILY AT 6PM FOR CHOLESTEROL. 90 tablet 3   traMADol (ULTRAM) 50 MG tablet Take 1 tablet (50 mg total) by mouth every 6 (six) hours as needed. 15 tablet 0   ATIVAN 0.5 MG tablet Take 0.5 tablets (  0.25 mg total) by mouth every 30 (thirty) minutes as needed for anxiety. 1st dose 30 minutes prior to MRI, repeat if needed. (Patient not taking: Reported on 05/20/2022) 1 tablet 0   No current facility-administered medications for this visit.    Review of Systems: GENERAL: negative for malaise, night sweats HEENT: No changes in hearing or vision, no nose bleeds or other nasal problems. NECK: Negative for lumps, goiter, pain and significant neck swelling RESPIRATORY: Negative for cough, wheezing CARDIOVASCULAR: Negative for chest pain, leg swelling, palpitations, orthopnea GI: SEE HPI MUSCULOSKELETAL: Negative for joint pain or swelling, back pain, and muscle pain. SKIN: Negative for lesions, rash PSYCH: Negative for sleep disturbance, mood disorder and recent psychosocial stressors. HEMATOLOGY Negative for prolonged bleeding, bruising easily, and swollen nodes. ENDOCRINE: Negative for cold or heat intolerance, polyuria, polydipsia and goiter. NEURO: negative for tremor, gait  imbalance, syncope and seizures. The remainder of the review of systems is noncontributory.   Physical Exam: BP 126/70 (BP Location: Left Arm, Patient Position: Sitting, Cuff Size: Large)   Pulse 94   Temp 98.3 F (36.8 C) (Oral)   Ht '5\' 10"'$  (1.778 m)   Wt (!) 332 lb 12.8 oz (151 kg)   BMI 47.75 kg/m  GENERAL: The patient is AO x3, in no acute distress. Obese. HEENT: Head is normocephalic and atraumatic. EOMI are intact. Mouth is well hydrated and without lesions. NECK: Supple. No masses LUNGS: Clear to auscultation. No presence of rhonchi/wheezing/rales. Adequate chest expansion HEART: RRR, normal s1 and s2. ABDOMEN: Soft, nontender, no guarding, no peritoneal signs, and nondistended. BS +. No masses. EXTREMITIES: Without any cyanosis, clubbing, rash, lesions or edema. NEUROLOGIC: AOx3, no focal motor deficit. SKIN: no jaundice, no rashes  Imaging/Labs: as above  I personally reviewed and interpreted the available labs, imaging and endoscopic files.  Impression and Plan: CAYTON CUEVAS is a 71 y.o. male with past medical history of coronary artery disease, GERD, hyperlipidemia, hypertension, history of MI, obesity, sleep apnea, hepatic hemangioma, who presents for follow up of intermittent diarrhea intermittent diarrhea.  The patient has presented intermittent isolated self-limited episodes of diarrhea, for which he has undergone an extensive evaluation that has been negative so far for any specific etiology.  It is very likely this is related to a functional disorder for which I advised the patient to take Bentyl as needed if he has breakthrough episodes of diarrhea, but he can try implementing the use of Benefiber on a frequent basis to increase the bulk of his stool.  The patient understood and agreed.  - Start Benefiber fiber supplements daily to increase stool bulk - Can take Bentyl as needed for breakthrough episodes of diarrhea  All questions were answered.      Maylon Peppers, MD Gastroenterology and Hepatology [facility]

## 2022-10-05 NOTE — Patient Instructions (Signed)
Start Benefiber fiber supplements daily to increase stool bulk Can take Bentyl as needed for breakthrough episodes of diarrhea

## 2022-11-10 DIAGNOSIS — Z08 Encounter for follow-up examination after completed treatment for malignant neoplasm: Secondary | ICD-10-CM | POA: Diagnosis not present

## 2022-11-10 DIAGNOSIS — D225 Melanocytic nevi of trunk: Secondary | ICD-10-CM | POA: Diagnosis not present

## 2022-11-10 DIAGNOSIS — Z1283 Encounter for screening for malignant neoplasm of skin: Secondary | ICD-10-CM | POA: Diagnosis not present

## 2022-11-10 DIAGNOSIS — Z85820 Personal history of malignant melanoma of skin: Secondary | ICD-10-CM | POA: Diagnosis not present

## 2022-11-23 DIAGNOSIS — C439 Malignant melanoma of skin, unspecified: Secondary | ICD-10-CM | POA: Diagnosis not present

## 2022-11-24 ENCOUNTER — Ambulatory Visit (INDEPENDENT_AMBULATORY_CARE_PROVIDER_SITE_OTHER): Payer: Medicare Other

## 2022-11-24 ENCOUNTER — Ambulatory Visit (INDEPENDENT_AMBULATORY_CARE_PROVIDER_SITE_OTHER): Payer: Medicare Other | Admitting: Orthopaedic Surgery

## 2022-11-24 VITALS — Ht 70.0 in | Wt 332.8 lb

## 2022-11-24 DIAGNOSIS — M25561 Pain in right knee: Secondary | ICD-10-CM

## 2022-11-24 DIAGNOSIS — G8929 Other chronic pain: Secondary | ICD-10-CM

## 2022-11-24 NOTE — Progress Notes (Signed)
Office Visit Note   Patient: Antonio Colon           Date of Birth: 21-Jan-1951           MRN: [id number] Visit Date: 11/24/2022              Requested by: Celene Squibb, MD [place],  [place] [address] PCP: Celene Squibb, MD   Assessment & Plan: Visit Diagnoses:  1. Chronic pain of right knee     Plan: Impression is nearly resolved right prepatellar bursitis.  Sounds like this was traumatic in nature.  Continue with symptomatic treatment.  We will provide him with home exercises for strengthening.  Follow-up as needed.  Follow-Up Instructions: No follow-ups on file.   Orders:  Orders Placed This Encounter  Procedures   XR KNEE 3 VIEW RIGHT   No orders of the defined types were placed in this encounter.     Procedures: No procedures performed   Clinical Data: No additional findings.   Subjective: Chief Complaint  Patient presents with   Right Knee - Edema    HPI [name] is a very pleasant 71 year old gentleman husband of Arbie Cookey comes in for evaluation of right knee pain and fluid.  He bumped his knee about 6 weeks ago and experienced swelling overlying the prepatellar bursa.  2 weeks ago he bumped it on the edge of a car door which made the symptoms worse.  Denies any constitutional symptoms.  Denies any redness.  Has had ongoing tenderness and pain to the anterior aspect of the knee.  Review of Systems  Constitutional: Negative.   HENT: Negative.    Eyes: Negative.   Respiratory: Negative.    Cardiovascular: Negative.   Gastrointestinal: Negative.   Endocrine: Negative.   Genitourinary: Negative.   Skin: Negative.   Allergic/Immunologic: Negative.   Neurological: Negative.   Hematological: Negative.   Psychiatric/Behavioral: Negative.    All other systems reviewed and are negative.    Objective: Vital Signs: Ht '5\' 10"'$  (1.778 m)   Wt (!) 332 lb 12.8 oz (151 kg)   BMI 47.75 kg/m   Physical Exam Vitals and nursing note reviewed.   Constitutional:      Appearance: He is well-developed.  Pulmonary:     Effort: Pulmonary effort is normal.  Abdominal:     Palpations: Abdomen is soft.  Skin:    General: Skin is warm.  Neurological:     Mental Status: He is alert and oriented to person, place, and time.  Psychiatric:        Behavior: Behavior normal.        Thought Content: Thought content normal.        Judgment: Judgment normal.     Ortho Exam Examination of the right knee shows no joint effusion.  No joint line tenderness.  He has good range of motion without pain.  He has slight residual swelling to the bursa.  No signs of infection. Specialty Comments:  No specialty comments available.  Imaging: XR KNEE 3 VIEW RIGHT  Result Date: 11/24/2022 Three-view x-rays of the right knee shows mild osteoarthritis with minimal joint space narrowing.  No acute or structural abnormalities.    PMFS History: Patient Active Problem List   Diagnosis Date Noted   Chronic diarrhea 04/23/2022   Abdominal pain 04/02/2022   Liver lesion, left lobe 10/30/2021   Diverticulosis of colon without hemorrhage 10/30/2021   Calculus of gallbladder without cholecystitis without obstruction 10/30/2021  Spondylolisthesis of lumbar region 07/10/2020   Obstructive sleep apnea 08/26/2018   Respiratory failure ([place]) 06/01/2018   Morbid obesity ([place]) 06/01/2018   NAFLD (nonalcoholic fatty liver disease) 01/26/2012   GERD (gastroesophageal reflux disease) 01/26/2012   CAD S/P percutaneous coronary angioplasty 01/26/2012   Hyperlipemia 01/26/2012   HTN (hypertension) 01/26/2012   FRACTURE, MEDIAL MALLEOLUS 11/20/2008   ANKLE SPRAIN, LEFT 11/20/2008   Past Medical History:  Diagnosis Date   CAD (coronary artery disease)    stents   Fracture, rib 05/2021   right side   GERD (gastroesophageal reflux disease)    Heart disease    Heart murmur    childhood   Hiatal hernia    History of stress test 01/27/2012   Normal study. No  significant ischemia demonstration, this low risk scan, no significant change compared to previous study.   Hypercholesteremia    Hyperlipidemia    Hypertension    Myocardial infarction ([place]) 2010   Obesity    Pneumonia 2019   Sleep apnea    cpap at night    Family History  Problem Relation Age of Onset   Heart disease Mother    Healthy Sister     Past Surgical History:  Procedure Laterality Date   AXILLARY LYMPH NODE DISSECTION Right 07/2021   BACK SURGERY  10/2019   Dr. Christella Noa: right L4-5 laminectomy   BIOPSY  06/02/2022   Procedure: BIOPSY;  Surgeon: Montez Morita, Quillian Quince, MD;  [facility];  Service: Gastroenterology;;   CARDIAC CATHETERIZATION  10/2009   CAD stents to the LAD using an Endeavor drug eluting (3x34m) by Dr LRex Kras he had normal circumflex and RCA at the time.   COLONOSCOPY  06/15/2012   COLONOSCOPY N/A 01/09/2019   Procedure: COLONOSCOPY;  Surgeon: RRogene Houston MD;  [facility];  Service: Endoscopy;  Laterality: N/A;  2:10pm   COLONOSCOPY WITH PROPOFOL N/A 06/02/2022   Procedure: COLONOSCOPY WITH PROPOFOL;  Surgeon: CHarvel Quale MD;  [facility];  Service: Gastroenterology;  Laterality: N/A;  730 ASA 2   ESOPHAGOGASTRODUODENOSCOPY N/A 11/15/2015   Procedure: ESOPHAGOGASTRODUODENOSCOPY (EGD);  Surgeon: NRogene Houston MD;  [facility];  Service: Endoscopy;  Laterality: N/A;  1:10 - moved to 12:55 - Ann notified pt   MELANOMA EXCISION  07/2021   back   POLYPECTOMY  01/09/2019   Procedure: POLYPECTOMY;  Surgeon: RRogene Houston MD;  [facility];  Service: Endoscopy;;  colon   POLYPECTOMY  06/02/2022   Procedure: POLYPECTOMY;  Surgeon: CHarvel Quale MD;  [facility];  Service: Gastroenterology;;   TONSILLECTOMY     Social History   Occupational History   Not on file  Tobacco Use   Smoking status: Former    Types: Cigarettes, Cigars    Quit date:  01/25/2009    Years since quitting: 13.8    Passive exposure: Past   Smokeless tobacco: Never  Vaping Use   Vaping Use: Never used  Substance and Sexual Activity   Alcohol use: No   Drug use: No   Sexual activity: Not on file

## 2022-12-17 DIAGNOSIS — I2510 Atherosclerotic heart disease of native coronary artery without angina pectoris: Secondary | ICD-10-CM | POA: Diagnosis not present

## 2022-12-22 ENCOUNTER — Other Ambulatory Visit (HOSPITAL_COMMUNITY): Payer: Self-pay | Admitting: Family Medicine

## 2022-12-22 DIAGNOSIS — I719 Aortic aneurysm of unspecified site, without rupture: Secondary | ICD-10-CM

## 2022-12-22 DIAGNOSIS — I700 Atherosclerosis of aorta: Secondary | ICD-10-CM | POA: Diagnosis not present

## 2022-12-22 DIAGNOSIS — N3281 Overactive bladder: Secondary | ICD-10-CM | POA: Diagnosis not present

## 2022-12-22 DIAGNOSIS — I10 Essential (primary) hypertension: Secondary | ICD-10-CM | POA: Diagnosis not present

## 2022-12-22 DIAGNOSIS — I2510 Atherosclerotic heart disease of native coronary artery without angina pectoris: Secondary | ICD-10-CM | POA: Diagnosis not present

## 2022-12-22 DIAGNOSIS — Z0000 Encounter for general adult medical examination without abnormal findings: Secondary | ICD-10-CM | POA: Diagnosis not present

## 2022-12-22 DIAGNOSIS — Z0001 Encounter for general adult medical examination with abnormal findings: Secondary | ICD-10-CM | POA: Diagnosis not present

## 2022-12-22 DIAGNOSIS — R911 Solitary pulmonary nodule: Secondary | ICD-10-CM | POA: Diagnosis not present

## 2022-12-22 DIAGNOSIS — E785 Hyperlipidemia, unspecified: Secondary | ICD-10-CM | POA: Diagnosis not present

## 2023-01-05 ENCOUNTER — Ambulatory Visit: Payer: Medicare Other | Attending: Cardiovascular Disease | Admitting: Cardiovascular Disease

## 2023-01-05 ENCOUNTER — Encounter: Payer: Self-pay | Admitting: Cardiovascular Disease

## 2023-01-05 VITALS — BP 132/62 | HR 68 | Ht 70.0 in | Wt 337.4 lb

## 2023-01-05 DIAGNOSIS — Z9861 Coronary angioplasty status: Secondary | ICD-10-CM

## 2023-01-05 DIAGNOSIS — I10 Essential (primary) hypertension: Secondary | ICD-10-CM | POA: Diagnosis not present

## 2023-01-05 DIAGNOSIS — G4733 Obstructive sleep apnea (adult) (pediatric): Secondary | ICD-10-CM

## 2023-01-05 DIAGNOSIS — E782 Mixed hyperlipidemia: Secondary | ICD-10-CM

## 2023-01-05 DIAGNOSIS — I2510 Atherosclerotic heart disease of native coronary artery without angina pectoris: Secondary | ICD-10-CM

## 2023-01-05 NOTE — Patient Instructions (Signed)
Medication Instructions:  Your physician recommends that you continue on your current medications as directed. Please refer to the Current Medication list given to you today.  *If you need a refill on your cardiac medications before your next appointment, please call your pharmacy*   Follow-Up: At [facility], you and your health needs are our priority.  As part of our continuing mission to provide you with exceptional heart care, we have created designated Provider Care Teams.  These Care Teams include your primary Cardiologist (physician) and Advanced Practice Providers (APPs -  Physician Assistants and Nurse Practitioners) who all work together to provide you with the care you need, when you need it.  We recommend signing up for the patient portal called "MyChart".  Sign up information is provided on this After Visit Summary.  MyChart is used to connect with patients for Virtual Visits (Telemedicine).  Patients are able to view lab/test results, encounter notes, upcoming appointments, etc.  Non-urgent messages can be sent to your provider as well.   To learn more about what you can do with MyChart, go to [email or web address].    Your next appointment:   12 month(s)  The format for your next appointment:   In Person  Provider:   Jonathan Berry, MD   

## 2023-01-05 NOTE — Assessment & Plan Note (Signed)
History of essential hypertension blood pressure measured today at 132/62.  He is on losartan, Bystolic and hydrochlorothiazide.

## 2023-01-05 NOTE — Assessment & Plan Note (Signed)
History of hyperlipidemia on statin therapy with lipid profile performed 12/17/2022 revealing total cholesterol of 135, LDL of 78 and HDL of 42.

## 2023-01-05 NOTE — Assessment & Plan Note (Signed)
History of obstructive sleep apnea on CPAP which he benefits from 

## 2023-01-05 NOTE — Assessment & Plan Note (Signed)
History of CAD status post LAD stenting with an endeavor 3 mm x 18 mm long drug-eluting stent by Dr. Aldona Bar November 2010.  He had normal circumflex and RCA at that time.  He denies chest pain.

## 2023-01-05 NOTE — Progress Notes (Unsigned)
01/05/2023 Antonio Colon   1951-04-03  [id number]  Primary Physician Antonio Squibb, MD Primary Cardiologist: Antonio Harp MD Antonio Colon, [place]  HPI:  Antonio Colon is a 72 y.o.    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  I last saw him in the office 12/30/2021. He has a history of CAD status post LAD stenting using an Endeavor drug eluting stent (3 x 18) by Dr. Aldona Colon in November 2010. He had normal circumflex and RCA at that time. His other problems include: Remote tobacco abuse, treated hypertension, and dyslipidemia. He saw Dr. Mali Colon in the office on January 21, 2012, complaining of chest pain, and a Myoview stress test performed a week later showed no ischemia. He does have a history of GERD. He has had no recurrent symptoms.Dr. Luan Colon follows his lipid profile closely.  He was working out at [employer] on the bicycle and treadmill however since he has had some back issues since I last saw him he now does exercise in the pool.   Since I saw him in the office a year ago he continues to do well.  He denies chest pain or shortness of breath.  Does have some arthritis in his right knee.   Current Meds  Medication Sig   acetaminophen (TYLENOL) 650 MG CR tablet Take 1,300 mg by mouth in the morning and at bedtime.   ALPRAZolam (XANAX) 0.25 MG tablet Take 0.25 mg by mouth at bedtime.   aspirin EC 81 MG tablet Take 1 tablet (81 mg total) by mouth daily.   losartan-hydrochlorothiazide (HYZAAR) 100-12.5 MG tablet Take 1 tablet by mouth daily.   Multiple Vitamin (MULTIVITAMIN WITH MINERALS) TABS tablet Take 1 tablet by mouth daily.   MYRBETRIQ 50 MG TB24 tablet Take 50 mg by mouth daily.    nebivolol (BYSTOLIC) 5 MG tablet TAKE ONE TABLET BY MOUTH ONCE DAILY.   nitroGLYCERIN (NITROSTAT) 0.4 MG SL  tablet Place 1 tablet (0.4 mg total) under the tongue every 5 (five) minutes as needed. For chest pain   rosuvastatin (CRESTOR) 5 MG tablet TAKE 1 TABLET BY MOUTH DAILY AT 6PM FOR CHOLESTEROL.     No Known Allergies  Social History   Socioeconomic History   Marital status: Married    Spouse name: Not on file   Number of children: Not on file   Years of education: Not on file   Highest education level: Not on file  Occupational History   Not on file  Tobacco Use   Smoking status: Former    Types: Cigarettes, Cigars    Quit date: 01/25/2009    Years since quitting: 13.9    Passive exposure: Past   Smokeless tobacco: Never  Vaping Use   Vaping Use: Never used  Substance and Sexual Activity   Alcohol use: No   Drug use: No   Sexual activity: Not on file  Other Topics Concern   Not on file  Social History Narrative   Not on file   Social Determinants of Health   Financial Resource Strain: Not on file  Food Insecurity: Not on file  Transportation Needs: Not on file  Physical Activity: Not on file  Stress: Not on file  Social Connections: Not on file  Intimate Partner Violence: Not on file  Review of Systems: General: negative for chills, fever, night sweats or weight changes.  Cardiovascular: negative for chest pain, dyspnea on exertion, edema, orthopnea, palpitations, paroxysmal nocturnal dyspnea or shortness of breath Dermatological: negative for rash Respiratory: negative for cough or wheezing Urologic: negative for hematuria Abdominal: negative for nausea, vomiting, diarrhea, bright red blood per rectum, melena, or hematemesis Neurologic: negative for visual changes, syncope, or dizziness All other systems reviewed and are otherwise negative except as noted above.    Blood pressure 132/62, pulse 68, height '5\' 10"'$  (1.778 m), weight (!) 337 lb 6.4 oz (153 kg), SpO2 92 %.  General appearance: alert and no distress Neck: no adenopathy, no carotid bruit, no JVD,  supple, symmetrical, trachea midline, and thyroid not enlarged, symmetric, no tenderness/mass/nodules Lungs: clear to auscultation bilaterally Heart: regular rate and rhythm, S1, S2 normal, no murmur, click, rub or gallop Extremities: extremities normal, atraumatic, no cyanosis or edema Pulses: 2+ and symmetric Skin: Skin color, texture, turgor normal. No rashes or lesions Neurologic: Grossly normal  EKG sinus rhythm at 68 with incomplete right bundle branch block and poor R wave progression with left axis deviation.  I personally reviewed this EKG.  ASSESSMENT AND PLAN:   CAD S/P percutaneous coronary angioplasty History of CAD status post LAD stenting with an endeavor 3 mm x 18 mm long drug-eluting stent by Dr. Aldona Colon November 2010.  He had normal circumflex and RCA at that time.  He denies chest pain.  Hyperlipemia History of hyperlipidemia on statin therapy with lipid profile performed 12/17/2022 revealing total cholesterol of 135, LDL of 78 and HDL of 42.  HTN (hypertension) History of essential hypertension blood pressure measured today at 132/62.  He is on losartan, Bystolic and hydrochlorothiazide.  Obstructive sleep apnea History of obstructive sleep apnea on CPAP which he benefits from.     Antonio Harp MD FACP,FACC,FAHA, [facility] 01/05/2023 9:33 AM

## 2023-02-01 ENCOUNTER — Ambulatory Visit (HOSPITAL_COMMUNITY)
Admission: RE | Admit: 2023-02-01 | Discharge: 2023-02-01 | Disposition: A | Discharge status: Home / Self Care | Payer: Medicare Other | Source: Ambulatory Visit | Attending: Family Medicine | Admitting: Family Medicine

## 2023-02-01 DIAGNOSIS — I712 Thoracic aortic aneurysm, without rupture, unspecified: Secondary | ICD-10-CM | POA: Diagnosis not present

## 2023-02-01 DIAGNOSIS — I719 Aortic aneurysm of unspecified site, without rupture: Secondary | ICD-10-CM | POA: Insufficient documentation

## 2023-02-01 MED ORDER — IOHEXOL 350 MG/ML SOLN
100.0000 mL | Freq: Once | INTRAVENOUS | Status: AC | PRN
  Administered 2023-02-01: 100 mL via INTRAVENOUS

## 2023-02-09 ENCOUNTER — Telehealth: Payer: Self-pay | Admitting: Internal Medicine

## 2023-02-09 DIAGNOSIS — Z08 Encounter for follow-up examination after completed treatment for malignant neoplasm: Secondary | ICD-10-CM | POA: Diagnosis not present

## 2023-02-09 DIAGNOSIS — D225 Melanocytic nevi of trunk: Secondary | ICD-10-CM | POA: Diagnosis not present

## 2023-02-09 DIAGNOSIS — Z85820 Personal history of malignant melanoma of skin: Secondary | ICD-10-CM | POA: Diagnosis not present

## 2023-02-09 DIAGNOSIS — Z1283 Encounter for screening for malignant neoplasm of skin: Secondary | ICD-10-CM | POA: Diagnosis not present

## 2023-02-09 NOTE — Telephone Encounter (Signed)
Scheduled appt per 2/13 referral. Pt is aware of appt date and time. Pt is aware to arrive 15 mins prior to appt time and to bring and updated insurance card. Pt is aware of appt location.

## 2023-02-15 ENCOUNTER — Telehealth: Payer: Self-pay | Admitting: Physician Assistant

## 2023-02-15 DIAGNOSIS — M25461 Effusion, right knee: Secondary | ICD-10-CM | POA: Diagnosis not present

## 2023-02-15 NOTE — Telephone Encounter (Signed)
R/s pt's appt. Pt is aware of new appt date/time.

## 2023-02-18 ENCOUNTER — Ambulatory Visit: Payer: Medicare Other | Admitting: Internal Medicine

## 2023-02-18 ENCOUNTER — Other Ambulatory Visit: Payer: Medicare Other

## 2023-02-22 DIAGNOSIS — G4733 Obstructive sleep apnea (adult) (pediatric): Secondary | ICD-10-CM | POA: Diagnosis not present

## 2023-02-26 ENCOUNTER — Other Ambulatory Visit: Payer: Self-pay | Admitting: Medical Oncology

## 2023-02-26 DIAGNOSIS — Z85820 Personal history of malignant melanoma of skin: Secondary | ICD-10-CM

## 2023-02-27 NOTE — Progress Notes (Unsigned)
[place] [phone]   [phone]  CONSULT NOTE  REFERRING PHYSICIAN: [facility]  REASON FOR CONSULTATION:  History of Stage IB Melanoma  HPI Antonio Colon is a 72 y.o. male with past medical history significant for coronary artery disease, myocardial infarction in 2010, hypertension, sleep apnea, nonalcoholic fatty liver disease, GERD, hyperlipidemia, and obesity is referred to the clinic for history of stage IB melanoma.  In July 2022, the patient had excision of a lesion on his right upper back which is found to be melanoma. He then subsequently underwent wide excision of right upper back melanoma and SLN biopsy 08/19/2021. Sentinel lymph node biopsy is negative for carcinoma. This was staged as T2A, N0, M0 or stage IB. The patient's case was discussed at the multidisciplinary conference at [facility] no adjuvant treatment was recommended.  He has been on observation since that time and feeling well.  He was alternating visits between his dermatology office in [facility] and a surgical oncology office in [place] that was affiliated with [facility].  Due to insurance changes, the patient's insurance no longer covers his visits with surgical oncology in [place].  He last saw his dermatologist last month and he is scheduled to see them back for a follow-up visit in May 2024.   He is overall feeling well today. Denies any fever, chills, night sweats, unexplained weight loss.  Denies any headache or visual changes.  Denies any chest pain, shortness of breath, cough, or hemoptysis.  Of note, the patient recently had a CT angio of the chest to follow-up on thoracic aneurysm and lung nodules.  The lung nodules are tiny subpleural nodules dating back to June 2022 and no imaging follow-up was recommended per radiology.  Denies any abdominal pain, abdominal bloating, nausea, vomiting, or constipation except he did recently see gastroenterology due to some intermittent  diarrhea and abdominal cramping for which she was prescribed Bentyl.   His family history consist of significant cardiac history on his mother side with several family members having heart attacks.  The patient is retired from working from the [employer], trade [employer].  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  He is married and does not have any children.  Denies any drug, alcohol, or tobacco use.   HPI  Past Medical History:  Diagnosis Date   CAD (coronary artery disease)    stents   Fracture, rib 05/2021   right side   GERD (gastroesophageal reflux disease)    Heart disease    Heart murmur    childhood   Hiatal hernia    History of stress test 01/27/2012   Normal study. No significant ischemia demonstration, this low risk scan, no significant change compared to previous study.   Hypercholesteremia    Hyperlipidemia    Hypertension    Myocardial infarction ([place]) 2010   Obesity    Pneumonia 2019   Sleep apnea    cpap at night    Past Surgical History:  Procedure Laterality Date   AXILLARY LYMPH NODE DISSECTION Right 07/2021   BACK SURGERY  10/2019   Dr. Christella Noa: right L4-5 laminectomy   BIOPSY  06/02/2022   Procedure: BIOPSY;  Surgeon: Harvel Quale, MD;  [facility];  Service: Gastroenterology;;   CARDIAC CATHETERIZATION  10/2009   CAD stents to the LAD using an Endeavor drug eluting (3x42m) by Dr LRex Kras he  had normal circumflex and RCA at the time.   COLONOSCOPY  06/15/2012   COLONOSCOPY N/A 01/09/2019   Procedure: COLONOSCOPY;  Surgeon: Rogene Houston, MD;  [facility];  Service: Endoscopy;  Laterality: N/A;  2:10pm   COLONOSCOPY WITH PROPOFOL N/A 06/02/2022   Procedure: COLONOSCOPY WITH PROPOFOL;  Surgeon: Harvel Quale, MD;  [facility];  Service: Gastroenterology;  Laterality: N/A;  730 ASA 2    ESOPHAGOGASTRODUODENOSCOPY N/A 11/15/2015   Procedure: ESOPHAGOGASTRODUODENOSCOPY (EGD);  Surgeon: Rogene Houston, MD;  [facility];  Service: Endoscopy;  Laterality: N/A;  1:10 - moved to 12:55 - Ann notified pt   MELANOMA EXCISION  07/2021   back   POLYPECTOMY  01/09/2019   Procedure: POLYPECTOMY;  Surgeon: Rogene Houston, MD;  [facility];  Service: Endoscopy;;  colon   POLYPECTOMY  06/02/2022   Procedure: POLYPECTOMY;  Surgeon: Montez Morita, Quillian Quince, MD;  [facility];  Service: Gastroenterology;;   TONSILLECTOMY      Family History  Problem Relation Age of Onset   Heart disease Mother    Healthy Sister     Social History Social History   Tobacco Use   Smoking status: Former    Types: Cigarettes, Cigars    Quit date: 01/25/2009    Years since quitting: 14.0    Passive exposure: Past   Smokeless tobacco: Never  Vaping Use   Vaping Use: Never used  Substance Use Topics   Alcohol use: No   Drug use: No    No Known Allergies  Current Outpatient Medications  Medication Sig Dispense Refill   acetaminophen (TYLENOL) 650 MG CR tablet Take 1,300 mg by mouth in the morning and at bedtime.     ALPRAZolam (XANAX) 0.25 MG tablet Take 0.25 mg by mouth at bedtime.     Ascorbic Acid (VITAMIN C PO) Take 1 tablet by mouth daily. (Patient not taking: Reported on 01/05/2023)     aspirin EC 81 MG tablet Take 1 tablet (81 mg total) by mouth daily.     ATIVAN 0.5 MG tablet Take 0.5 tablets (0.25 mg total) by mouth every 30 (thirty) minutes as needed for anxiety. 1st dose 30 minutes prior to MRI, repeat if needed. (Patient not taking: Reported on 05/20/2022) 1 tablet 0   dicyclomine (BENTYL) 10 MG capsule Take 1 capsule (10 mg total) by mouth every 12 (twelve) hours as needed for spasms (abdominal pain). (Patient not taking: Reported on 01/05/2023) 60 capsule 2   losartan-hydrochlorothiazide (HYZAAR) 100-12.5 MG tablet Take 1 tablet by mouth daily.      Multiple Vitamin (MULTIVITAMIN WITH MINERALS) TABS tablet Take 1 tablet by mouth daily.     MYRBETRIQ 50 MG TB24 tablet Take 50 mg by mouth daily.      nebivolol (BYSTOLIC) 5 MG tablet TAKE ONE TABLET BY MOUTH ONCE DAILY. 30 tablet 6   nitroGLYCERIN (NITROSTAT) 0.4 MG SL tablet Place 1 tablet (0.4 mg total) under the tongue every 5 (five) minutes as needed. For chest pain 25 tablet 11   rosuvastatin (CRESTOR) 5 MG tablet TAKE 1 TABLET BY MOUTH DAILY AT 6PM FOR CHOLESTEROL. 90 tablet 3   traMADol (ULTRAM) 50 MG tablet Take 1 tablet (50 mg total) by mouth every 6 (six) hours as needed. (Patient not taking: Reported on 01/05/2023) 15 tablet 0   No current facility-administered medications for this visit.    REVIEW OF SYSTEMS:   Review of Systems  Constitutional: Negative  for appetite change, chills, fatigue, fever and unexpected weight change.  HENT: Negative for mouth sores, nosebleeds, sore throat and trouble swallowing.   Eyes: Negative for eye problems and icterus.  Respiratory: Negative for cough, hemoptysis, shortness of breath and wheezing.   Cardiovascular: Negative for chest pain and leg swelling.  Gastrointestinal: Negative for abdominal pain, constipation, diarrhea, nausea and vomiting.  Genitourinary: Negative for bladder incontinence, difficulty urinating, dysuria, frequency and hematuria.   Musculoskeletal: Negative for back pain, gait problem, neck pain and neck stiffness.  Skin: Negative for itching and rash.  Neurological: Negative for dizziness, extremity weakness, gait problem, headaches, light-headedness and seizures.  Hematological: Negative for adenopathy. Does not bruise/bleed easily.  Psychiatric/Behavioral: Negative for confusion, depression and sleep disturbance. The patient is not nervous/anxious.     PHYSICAL EXAMINATION:  There were no vitals taken for this visit.  ECOG PERFORMANCE STATUS: 0  Physical Exam  Constitutional: Oriented to person, place, and time  and well-developed, well-nourished, and in no distress.  HENT:  Head: Normocephalic and atraumatic.  Mouth/Throat: Oropharynx is clear and moist. No oropharyngeal exudate.  Eyes: Conjunctivae are normal. Right eye exhibits no discharge. Left eye exhibits no discharge. No scleral icterus.  Neck: Normal range of motion. Neck supple.  Cardiovascular: Normal rate, regular rhythm, normal heart sounds and intact distal pulses.   Pulmonary/Chest: Effort normal and breath sounds normal. No respiratory distress. No wheezes. No rales.  Abdominal: Soft. Bowel sounds are normal. Exhibits no distension and no mass. There is no tenderness.  Musculoskeletal: Normal range of motion. Exhibits no edema.  Lymphadenopathy:    No cervical adenopathy.  Neurological: Alert and oriented to person, place, and time. Exhibits normal muscle tone. Gait normal. Coordination normal.  Skin: Skin is warm and dry. No rash noted. Not diaphoretic. No erythema. No pallor.  Psychiatric: Mood, memory and judgment normal.  Vitals reviewed.  LABORATORY DATA: Lab Results  Component Value Date   WBC 9.2 07/08/2020   HGB 16.0 07/08/2020   HCT 49.3 07/08/2020   MCV 90.0 07/08/2020   PLT 279 07/08/2020      Chemistry      Component Value Date/Time   NA 139 07/08/2020 1425   K 3.8 07/08/2020 1425   CL 104 07/08/2020 1425   CO2 24 07/08/2020 1425   BUN 18 07/08/2020 1425   CREATININE 1.10 06/08/2022 1510   CREATININE 0.90 07/05/2013 0920      Component Value Date/Time   CALCIUM 9.0 07/08/2020 1425   ALKPHOS 80 08/26/2018 0817   AST 20 08/26/2018 0817   ALT 23 08/26/2018 0817   BILITOT 0.8 08/26/2018 0817       RADIOGRAPHIC STUDIES: CT ANGIO CHEST AORTA W/CM & OR WO/CM  Result Date: 02/01/2023 CLINICAL DATA:  Follow-up lung nodule and thoracic aortic aneurysm EXAM: CT ANGIOGRAPHY CHEST WITH CONTRAST TECHNIQUE: Multidetector CT imaging of the chest was performed using the standard protocol during bolus  administration of intravenous contrast. Multiplanar CT image reconstructions and MIPs were obtained to evaluate the vascular anatomy. RADIATION DOSE REDUCTION: This exam was performed according to the departmental dose-optimization program which includes automated exposure control, adjustment of the mA and/or kV according to patient size and/or use of iterative reconstruction technique. CONTRAST:  152m OMNIPAQUE IOHEXOL 350 MG/ML SOLN COMPARISON:  Prior CT scan of the chest 10/20/2021; 06/03/2021 FINDINGS: Cardiovascular: The aorta is normal in caliber. Conventional 3 vessel arch anatomy. Mild atherosclerotic vascular calcifications. Maximal aortic diameter is 3.8 cm. No aneurysm. The aortic root is normal  in caliber. Calcifications along the coronary arteries. Mild cardiomegaly. No pericardial effusion. Mediastinum/Nodes: Unremarkable CT appearance of the thyroid gland. No suspicious mediastinal or hilar adenopathy. No soft tissue mediastinal mass. The thoracic esophagus is unremarkable. Lungs/Pleura: Stable appearance of diffuse mild bronchial wall thickening. Scattered 1-3 mm subpleural pulmonary nodules are noted and remain unchanged compared to prior imaging from October 22 and June of 2022. Near 2 year stability is consistent with benignity. No further follow-up recommended. Upper Abdomen: 3.9 cm simple cyst in hepatic segment 2 is slightly larger compared to prior imaging from 2022. No solid hepatic mass. Cholelithiasis noted incidentally. No acute abnormality within the upper abdomen. Musculoskeletal: No acute fracture or aggressive appearing lytic or blastic osseous lesion. Prominent bridging anterior osteophytes consistent with dystrophic idiopathic skeletal hyperostosis. Review of the MIP images confirms the above findings. IMPRESSION: 1. Mildly dilated but nonaneurysmal ascending thoracic aorta with a maximal diameter of 3.8 cm. Prior measurements of 4.1 cm were likely exaggerated by cardiac motion  artifact. 2. Stable tiny subpleural pulmonary nodules dating back to June of 2022. No further imaging follow-up recommended. 3. Aortic and coronary artery atherosclerotic vascular calcifications. 4. Slight interval enlargement in probable simple hepatic cyst in segment 2 of the left hemi liver. 5. Cholelithiasis. 6. Prominent bridging anterior osteophytes along the thoracic spine consistent with dystrophic idiopathic skeletal hyperostosis (DISH). 7. Mild bilateral lower lobe bronchial wall thickening. Aortic Atherosclerosis (ICD10-I70.0). Electronically Signed   By: Jacqulynn Cadet M.D.   On: 02/01/2023 16:21    ASSESSMENT: This is a very pleasant 72 year old Caucasian male referred to clinic for history of history of stage IB (T2A, N0, M0) melanoma diagnosis 2022.  The patient underwent excision on July/August 2022 and has been on observation since that time.  Sentinel lymph node biopsy was negative.   The patient was previously followed by surgical oncology at [facility] but is reestablishing at our clinic due to insurance. He follows with dermatology in [place].   The patient was seen with Dr. Julien Nordmann. Labs were reviewed.  Dr. Julien Nordmann recommends that we see the patient annually once a year with a chest x-ray.  Since he recently had a CT angiogram last month, no chest x-ray is required at this time.  He will continue to follow with dermatology.  The patient voices understanding of current disease status and treatment options and is in agreement with the current care plan.  All questions were answered. The patient knows to call the clinic with any problems, questions or concerns. We can certainly see the patient much sooner if necessary.  Thank you so much for allowing me to participate in the care of Antonio Colon. I will continue to follow up the patient with you and assist in his care.   Disclaimer: This note was dictated with voice recognition software. Similar sounding words can inadvertently  be transcribed and may not be corrected upon review.   Elener Custodio L Osmani Kersten February 27, 2023, 10:28 AM  ADDENDUM: Hematology/Oncology Attending: I had a face-to-face encounter with the patient today.  I reviewed his record, lab and recommended his care plan.  This is a very pleasant 72 years old white male with past medical history significant for hypertension, dyslipidemia, GERD, nonalcoholic fatty liver disease, coronary artery disease, myocardial infarction in 2010 as well as sleep apnea and obesity.  The patient was diagnosed with cutaneous melanoma in July 2022 with excision of the lesion from his right upper back followed by wide excision of the right upper back melanoma  as well as sentinel lymph node biopsy that was negative for melanoma.  His staging at that time was T2 a, N0, M0 or stage Ib.  The patient was followed by his plastic surgeon at [facility] in addition to his dermatologist in [place].  He has been in observation since that time and there was no requirement for adjuvant immunotherapy. Because of insurance issues they are not covering his visit at [facility] with the surgeon and he was referred here today for close monitoring of his condition. When seen today he is feeling fine with no concerning complaints.  He denied having any current chest pain, shortness of breath, cough or hemoptysis.  He has no recent weight loss or night sweats.  He has no other suspicious skin lesion.  He is followed by dermatology every 6 months. I recommended for the patient to continue on observation with repeat CBC, comprehensive metabolic panel, LDH and chest x-ray in 1 year.  His last CT scan of the chest was performed few weeks ago and it was negative for any metastatic disease to the lung. The patient was advised to call immediately if he has any other concerning symptoms in the interval. The total time spent in the appointment was 60 minutes. Disclaimer: This note was dictated with  voice recognition software. Similar sounding words can inadvertently be transcribed and may be missed upon review.  Eilleen Kempf, MD

## 2023-03-01 ENCOUNTER — Inpatient Hospital Stay (HOSPITAL_BASED_OUTPATIENT_CLINIC_OR_DEPARTMENT_OTHER): Payer: Medicare Other

## 2023-03-01 ENCOUNTER — Inpatient Hospital Stay: Payer: Medicare Other | Attending: Internal Medicine | Admitting: Physician Assistant

## 2023-03-01 VITALS — BP 164/73 | HR 66 | Temp 98.2°F | Resp 13 | Wt 339.9 lb

## 2023-03-01 DIAGNOSIS — Z85820 Personal history of malignant melanoma of skin: Secondary | ICD-10-CM | POA: Diagnosis not present

## 2023-03-01 DIAGNOSIS — Z79899 Other long term (current) drug therapy: Secondary | ICD-10-CM | POA: Insufficient documentation

## 2023-03-01 DIAGNOSIS — Z7982 Long term (current) use of aspirin: Secondary | ICD-10-CM | POA: Insufficient documentation

## 2023-03-01 DIAGNOSIS — Z9889 Other specified postprocedural states: Secondary | ICD-10-CM | POA: Insufficient documentation

## 2023-03-01 DIAGNOSIS — G4733 Obstructive sleep apnea (adult) (pediatric): Secondary | ICD-10-CM | POA: Diagnosis not present

## 2023-03-01 LAB — CBC WITH DIFFERENTIAL (CANCER CENTER ONLY)
Abs Immature Granulocytes: 0.03 10*3/uL (ref 0.00–0.07)
Basophils Absolute: 0.1 10*3/uL (ref 0.0–0.1)
Basophils Relative: 1 %
Eosinophils Absolute: 0.3 10*3/uL (ref 0.0–0.5)
Eosinophils Relative: 3 %
HCT: 46.6 % (ref 39.0–52.0)
Hemoglobin: 16.4 g/dL (ref 13.0–17.0)
Immature Granulocytes: 0 %
Lymphocytes Relative: 23 %
Lymphs Abs: 2.7 10*3/uL (ref 0.7–4.0)
MCH: 30.1 pg (ref 26.0–34.0)
MCHC: 35.2 g/dL (ref 30.0–36.0)
MCV: 85.5 fL (ref 80.0–100.0)
Monocytes Absolute: 1 10*3/uL (ref 0.1–1.0)
Monocytes Relative: 8 %
Neutro Abs: 7.7 10*3/uL (ref 1.7–7.7)
Neutrophils Relative %: 65 %
Platelet Count: 254 10*3/uL (ref 150–400)
RBC: 5.45 MIL/uL (ref 4.22–5.81)
RDW: 13.6 % (ref 11.5–15.5)
WBC Count: 11.7 10*3/uL — ABNORMAL HIGH (ref 4.0–10.5)
nRBC: 0 % (ref 0.0–0.2)

## 2023-03-01 LAB — CMP (CANCER CENTER ONLY)
ALT: 24 U/L (ref 0–44)
AST: 16 U/L (ref 15–41)
Albumin: 4.1 g/dL (ref 3.5–5.0)
Alkaline Phosphatase: 80 U/L (ref 38–126)
Anion gap: 7 (ref 5–15)
BUN: 16 mg/dL (ref 8–23)
CO2: 28 mmol/L (ref 22–32)
Calcium: 9.4 mg/dL (ref 8.9–10.3)
Chloride: 105 mmol/L (ref 98–111)
Creatinine: 1.02 mg/dL (ref 0.61–1.24)
GFR, Estimated: >60 mL/min (ref >60)
Glucose, Bld: 101 mg/dL — ABNORMAL HIGH (ref 70–99)
Potassium: 3.6 mmol/L (ref 3.5–5.1)
Sodium: 140 mmol/L (ref 135–145)
Total Bilirubin: 0.5 mg/dL (ref 0.3–1.2)
Total Protein: 6.8 g/dL (ref 6.5–8.1)

## 2023-03-02 ENCOUNTER — Other Ambulatory Visit: Payer: Self-pay | Admitting: Cardiovascular Disease

## 2023-03-10 DIAGNOSIS — M25461 Effusion, right knee: Secondary | ICD-10-CM | POA: Diagnosis not present

## 2023-03-29 ENCOUNTER — Encounter: Payer: Self-pay | Admitting: Orthopedic Surgery

## 2023-03-29 ENCOUNTER — Ambulatory Visit: Payer: Medicare Other | Admitting: Orthopedic Surgery

## 2023-03-29 VITALS — BP 174/110 | HR 80 | Ht 70.0 in | Wt 343.0 lb

## 2023-03-29 DIAGNOSIS — M25561 Pain in right knee: Secondary | ICD-10-CM | POA: Diagnosis not present

## 2023-03-29 DIAGNOSIS — G8929 Other chronic pain: Secondary | ICD-10-CM | POA: Diagnosis not present

## 2023-03-29 DIAGNOSIS — M25461 Effusion, right knee: Secondary | ICD-10-CM | POA: Diagnosis not present

## 2023-03-29 DIAGNOSIS — M1711 Unilateral primary osteoarthritis, right knee: Secondary | ICD-10-CM | POA: Diagnosis not present

## 2023-03-29 MED ORDER — METHYLPREDNISOLONE ACETATE 40 MG/ML IJ SUSP
40.0000 mg | Freq: Once | INTRAMUSCULAR | Status: AC
  Administered 2023-03-29: 40 mg via INTRA_ARTICULAR

## 2023-03-29 MED ORDER — IBUPROFEN 800 MG PO TABS: 800.0000 mg | ORAL_TABLET | Freq: Three times a day (TID) | ORAL | 1 refills | Status: DC | PRN

## 2023-03-29 NOTE — Progress Notes (Signed)
Office Visit Note   Patient: Antonio Colon           Date of Birth: [birth year]           MRN: [id number] Visit Date: 03/29/2023 Requested by: Celene Squibb, MD [place],   [id number] PCP: Celene Squibb, MD  Subjective: Chief Complaint  Patient presents with   Knee Pain    Right had xrays by Dr Erlinda Hong in [place]     HPI: 72 year old male previously seen in [place] but no treatment instituted when x-ray showed arthritis of the knee.  He has reported no injury although 1 part of his records that he may have bumped his knee back in November when he saw Dr. Sherrian Divers  He complains of pain swelling right knee.  He says it is hard to walk.  He is having to use a cane.  He says the area in front of his knee was aspirated twice where they got 2 cc of fluid and put him on prednisone twice and he is taking 2 Advil in the morning 2 Advil at night and he is not getting good pain relief  However, the area that was drained was a prepatellar bursa at this current complaint seems to involve his whole knee joint  He was referred to [place] by a friend who had knee replacement surgery done and was happy with his result  He has no [place] drug allergies  He does have a stent in his heart he has sleep apnea uses a CPAP machine              ROS: Shortness of breath on occasion seems to be exertional related as what is described as COPD  He has a Nitrostat tablet to take as needed sublingual for chest pain  His other medical records that were sent with his referral from his primary care doctor do not add much other information other than stated  Assessment & Plan:  Knee effusion right knee, aspiration injection fluid analysis and follow-up in 2 weeks cortisone injection  Consent obtained and and site confirmation for right knee aspiration injection  Procedure note right knee injection after aspiration of the knee joint approximately 30 cc of clear yellow fluid  The medications used were  depomedrol 40 mg and 1% lidocaine 3 cc Anesthesia was provided by ethyl chloride and the skin was prepped with alcohol.  After cleaning the skin with alcohol a 20-gauge needle was used to inject the right knee joint. There were no complications. A sterile bandage was applied.   Images personally read and my interpretation : Images are noted from the 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 Effusion, right knee   2. Acute pain of right knee       Follow-Up Instructions: Return in about 2 weeks (around 04/12/2023) for FOLLOW UP, RIGHT, KNEE with fluid results .   Orders:  Meds ordered this encounter  Medications   ibuprofen (ADVIL) 800 MG tablet    Sig: Take 1 tablet (800 mg total) by mouth every 8 (eight) hours as needed.    Dispense:  90 tablet    Refill:  1      Objective: Vital Signs: BP (!) 174/110   Pulse 80   Ht 5\' 10"  (1.778 m)   Wt (!) 343 lb (155.6  kg)   BMI 49.22 kg/m   Physical Exam Vitals and nursing note reviewed.  Constitutional:      Appearance: Normal appearance.  HENT:     Head: Normocephalic and atraumatic.  Eyes:     General: No scleral icterus.       Right eye: No discharge.        Left eye: No discharge.     Extraocular Movements: Extraocular movements intact.     Conjunctiva/sclera: Conjunctivae normal.     Pupils: Pupils are equal, round, and reactive to light.  Cardiovascular:     Rate and Rhythm: Normal rate.     Pulses: Normal pulses.  Musculoskeletal:     Right knee: Effusion present.  Skin:    General: Skin is warm and dry.     Capillary Refill: Capillary refill takes less than 2 seconds.  Neurological:     General: No focal deficit present.     Mental Status: He is alert and oriented to person, place, and time.  Psychiatric:        Mood and Affect: Mood normal.        Behavior:  Behavior normal.        Thought Content: Thought content normal.        Judgment: Judgment normal.      Right Knee Exam   Muscle Strength  The patient has normal right knee strength.  Tenderness  The patient is experiencing tenderness in the lateral joint line and medial joint line.  Range of Motion  Extension:  abnormal  Flexion:  abnormal   Tests  Drawer:  Anterior - negative    Posterior - negative  Other  Erythema: absent Scars: absent Sensation: normal Pulse: present Swelling: none Effusion: effusion present       Specialty Comments:  No specialty comments available.  Imaging: No results found.   PMFS History: Patient Active Problem List   Diagnosis Date Noted   Hx of melanoma excision 03/01/2023   Chronic diarrhea 04/23/2022   Abdominal pain 04/02/2022   Liver lesion, left lobe 10/30/2021   Diverticulosis of colon without hemorrhage 10/30/2021   Calculus of gallbladder without cholecystitis without obstruction 10/30/2021   Spondylolisthesis of lumbar region 07/10/2020   Obstructive sleep apnea 08/26/2018   Respiratory failure 06/01/2018   Morbid obesity 06/01/2018   NAFLD (nonalcoholic fatty liver disease) 01/26/2012   GERD (gastroesophageal reflux disease) 01/26/2012   CAD S/P percutaneous coronary angioplasty 01/26/2012   Hyperlipemia 01/26/2012   HTN (hypertension) 01/26/2012   FRACTURE, MEDIAL MALLEOLUS 11/20/2008   ANKLE SPRAIN, LEFT 11/20/2008   Past Medical History:  Diagnosis Date   CAD (coronary artery disease)    stents   Fracture, rib 05/2021   right side   GERD (gastroesophageal reflux disease)    Heart disease    Heart murmur    childhood   Hiatal hernia    History of stress test 01/27/2012   Normal study. No significant ischemia demonstration, this low risk scan, no significant change compared to previous study.   Hypercholesteremia    Hyperlipidemia    Hypertension    Myocardial infarction 2010   Obesity     Pneumonia 2019   Sleep apnea    cpap at night    Family History  Problem Relation Age of Onset   Heart disease Mother    Healthy Sister     Past Surgical History:  Procedure Laterality Date   AXILLARY LYMPH NODE DISSECTION Right 07/2021   BACK SURGERY  10/2019   Dr. Christella Noa: right L4-5 laminectomy   BIOPSY  06/02/2022   Procedure: BIOPSY;  Surgeon: Montez Morita, Quillian Quince, MD;  [facility];  Service: Gastroenterology;;   CARDIAC CATHETERIZATION  10/2009   CAD stents to the LAD using an Endeavor drug eluting (3x65mm) by Dr Rex Kras, he had normal circumflex and RCA at the time.   COLONOSCOPY  06/15/2012   COLONOSCOPY N/A 01/09/2019   Procedure: COLONOSCOPY;  Surgeon: Rogene Houston, MD;  [facility];  Service: Endoscopy;  Laterality: N/A;  2:10pm   COLONOSCOPY WITH PROPOFOL N/A 06/02/2022   Procedure: COLONOSCOPY WITH PROPOFOL;  Surgeon: Harvel Quale, MD;  [facility];  Service: Gastroenterology;  Laterality: N/A;  730 ASA 2   ESOPHAGOGASTRODUODENOSCOPY N/A 11/15/2015   Procedure: ESOPHAGOGASTRODUODENOSCOPY (EGD);  Surgeon: Rogene Houston, MD;  [facility];  Service: Endoscopy;  Laterality: N/A;  1:10 - moved to 12:55 - Ann notified pt   MELANOMA EXCISION  07/2021   back   POLYPECTOMY  01/09/2019   Procedure: POLYPECTOMY;  Surgeon: Rogene Houston, MD;  [facility];  Service: Endoscopy;;  colon   POLYPECTOMY  06/02/2022   Procedure: POLYPECTOMY;  Surgeon: Harvel Quale, MD;  [facility];  Service: Gastroenterology;;   TONSILLECTOMY     Social History   Occupational History   Not on file  Tobacco Use   Smoking status: Former    Types: Cigarettes, Cigars    Quit date: 01/25/2009    Years since quitting: 14.1    Passive exposure: Past   Smokeless tobacco: Never  Vaping Use   Vaping Use: Never used  Substance and Sexual Activity   Alcohol use: No   Drug use: No   Sexual activity:  Not on file

## 2023-03-29 NOTE — Addendum Note (Signed)
Addended byCandice Camp on: 03/29/2023 03:57 PM   Modules accepted: Orders

## 2023-04-02 ENCOUNTER — Telehealth: Payer: Self-pay | Admitting: Orthopedic Surgery

## 2023-04-02 NOTE — Telephone Encounter (Signed)
I called the patient today at about 1:15 PM to let him know that his fluid came back with just inflammatory cells

## 2023-04-04 LAB — SYNOVIAL FLUID ANALYSIS, COMPLETE
Basophils, %: 0 %
Eosinophils-Synovial: 2 % (ref 0–2)
Lymphocytes-Synovial Fld: 84 % — ABNORMAL HIGH (ref 0–74)
Monocyte/Macrophage: 2 % (ref 0–69)
Neutrophil, Synovial: 11 % (ref 0–24)
Synoviocytes, %: 1 % (ref 0–15)
WBC, Synovial: 1041 cells/uL — ABNORMAL HIGH (ref <150)

## 2023-04-04 LAB — WOUND CULTURE
GRAM STAIN:: NONE SEEN
MICRO NUMBER:: 14768408
RESULT:: NO GROWTH
SPECIMEN QUALITY:: ADEQUATE

## 2023-04-05 ENCOUNTER — Other Ambulatory Visit: Payer: Self-pay | Admitting: Cardiovascular Disease

## 2023-04-12 ENCOUNTER — Ambulatory Visit: Payer: Medicare Other | Admitting: Orthopedic Surgery

## 2023-04-19 ENCOUNTER — Ambulatory Visit: Payer: Medicare Other | Admitting: Orthopedic Surgery

## 2023-04-19 ENCOUNTER — Encounter: Payer: Self-pay | Admitting: Orthopedic Surgery

## 2023-04-19 DIAGNOSIS — M23321 Other meniscus derangements, posterior horn of medial meniscus, right knee: Secondary | ICD-10-CM

## 2023-04-19 DIAGNOSIS — M25461 Effusion, right knee: Secondary | ICD-10-CM | POA: Diagnosis not present

## 2023-04-19 DIAGNOSIS — G8929 Other chronic pain: Secondary | ICD-10-CM | POA: Diagnosis not present

## 2023-04-19 DIAGNOSIS — M25561 Pain in right knee: Secondary | ICD-10-CM | POA: Diagnosis not present

## 2023-04-19 DIAGNOSIS — I2510 Atherosclerotic heart disease of native coronary artery without angina pectoris: Secondary | ICD-10-CM | POA: Diagnosis not present

## 2023-04-19 DIAGNOSIS — R7301 Impaired fasting glucose: Secondary | ICD-10-CM | POA: Diagnosis not present

## 2023-04-19 MED ORDER — DICLOFENAC POTASSIUM 50 MG PO TABS: 50.0000 mg | ORAL_TABLET | Freq: Two times a day (BID) | ORAL | 3 refills | Status: DC

## 2023-04-19 NOTE — Patient Instructions (Signed)
Central Scheduling [phone]  While we are working on your approval please go ahead and call to schedule your appointment to be done within one week. Once I have gotten the imaging approved for the facility you choose, I can't change it. [facility] is usually booked out for several weeks with imaging appointments.   

## 2023-04-19 NOTE — Progress Notes (Signed)
  FOLLOW UP   Encounter Diagnoses  Name Primary?   Effusion, right knee    Chronic pain of right knee    Derangement of posterior horn of medial meniscus of right knee Yes     Chief Complaint  Patient presents with   Knee Pain    RT/follow up regarding labs Pain improved for a few days after he received injection, but now the knee is hurting just like it was when he was seen.     Status post aspiration injection right knee cell count showed inflammatory cells no evidence of crystal disease or infection  I discussed this with Antonio Colon he wants something done however am not sure exactly what is causing his knee pain  Again he complained of anteromedial knee pain and giving way when walking  At this point MRI evaluation is in order to rule out a meniscal tear which we will do  He will come in for the results and possible preop for arthroscopic surgery   Meds ordered this encounter  Medications   diclofenac (CATAFLAM) 50 MG tablet    Sig: Take 1 tablet (50 mg total) by mouth 2 (two) times daily.    Dispense:  60 tablet    Refill:  3    A/I

## 2023-04-21 ENCOUNTER — Telehealth: Payer: Self-pay | Admitting: Cardiovascular Disease

## 2023-04-21 NOTE — Telephone Encounter (Signed)
Called patient, made aware of PHARMD recommendations.   Patient verbalized understanding

## 2023-04-21 NOTE — Telephone Encounter (Signed)
Diclofenac is a NSAID so yes it can cause cardiac issues. Recommend he take for the shortest time possible.

## 2023-04-21 NOTE — Telephone Encounter (Signed)
Pt c/o medication issue:  1. Name of Medication: Diclofenac potassium     2. How are you currently taking this medication (dosage and times per day)? 2x a day   3. Are you having a reaction (difficulty breathing--STAT)? no  4. What is your medication issue? Patient was prescribed this medication for arthritis in his knee.  He wants to make sure it is safe to take.  He states he read possible side effects could be heart attack or stroke.  Please advise

## 2023-04-26 DIAGNOSIS — I700 Atherosclerosis of aorta: Secondary | ICD-10-CM | POA: Diagnosis not present

## 2023-04-26 DIAGNOSIS — I719 Aortic aneurysm of unspecified site, without rupture: Secondary | ICD-10-CM | POA: Diagnosis not present

## 2023-04-26 DIAGNOSIS — N3281 Overactive bladder: Secondary | ICD-10-CM | POA: Diagnosis not present

## 2023-04-26 DIAGNOSIS — K589 Irritable bowel syndrome without diarrhea: Secondary | ICD-10-CM | POA: Diagnosis not present

## 2023-04-26 DIAGNOSIS — R7301 Impaired fasting glucose: Secondary | ICD-10-CM | POA: Diagnosis not present

## 2023-04-26 DIAGNOSIS — I2510 Atherosclerotic heart disease of native coronary artery without angina pectoris: Secondary | ICD-10-CM | POA: Diagnosis not present

## 2023-04-26 DIAGNOSIS — R911 Solitary pulmonary nodule: Secondary | ICD-10-CM | POA: Diagnosis not present

## 2023-04-26 DIAGNOSIS — I10 Essential (primary) hypertension: Secondary | ICD-10-CM | POA: Diagnosis not present

## 2023-04-26 DIAGNOSIS — R7303 Prediabetes: Secondary | ICD-10-CM | POA: Diagnosis not present

## 2023-04-26 DIAGNOSIS — E785 Hyperlipidemia, unspecified: Secondary | ICD-10-CM | POA: Diagnosis not present

## 2023-04-27 IMAGING — CT CT ABD-PELV W/ CM
2 of 5 series · 16 of 46 positions shown, 18 images · IV contrast (APPLIED)
Comparison: CT abdomen and pelvis 10/28/2015

CLINICAL DATA: Abdominal pain, diarrhea

EXAM:
CT ABDOMEN AND PELVIS WITH CONTRAST
TECHNIQUE: Multidetector CT imaging of the abdomen and pelvis was performed
using the standard protocol following bolus administration of
intravenous contrast.

[Series 2: abd pel w · axial · 0.98mm/px · z∈[+701,+1181]mm · 13 of 110 slices shown, 15 images]
[im 7/110  soft-tissue]
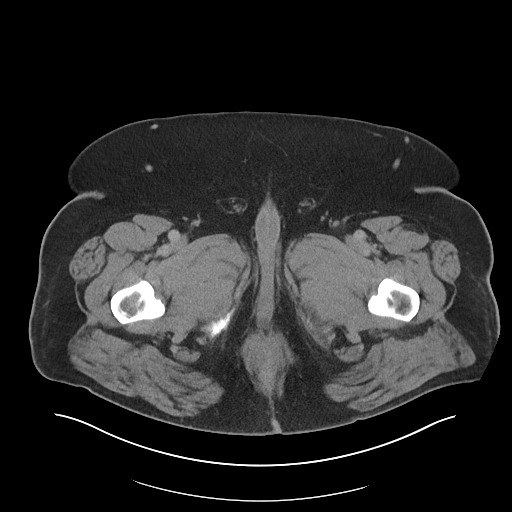
[im 7/110  bone]
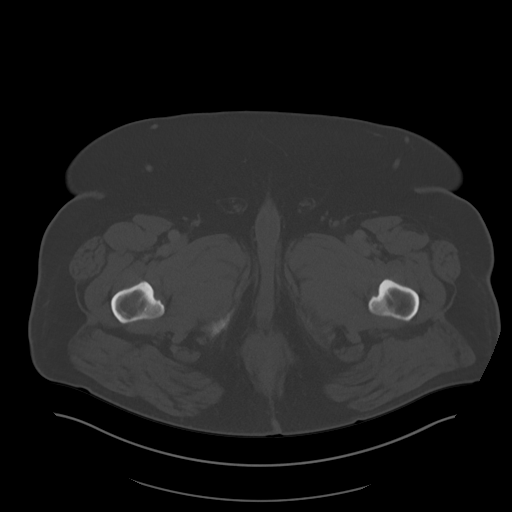
[im 13/110  soft-tissue]
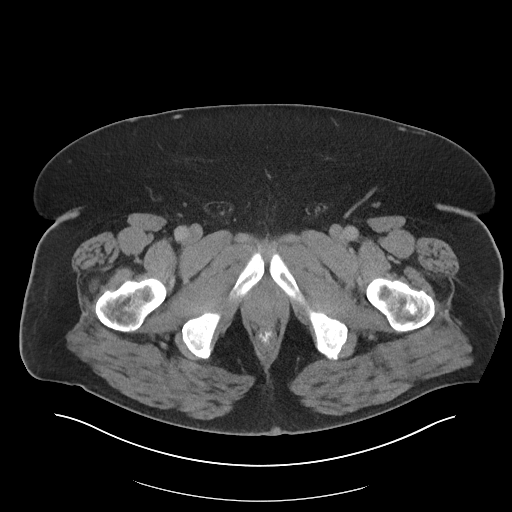
[im 25/110  soft-tissue]
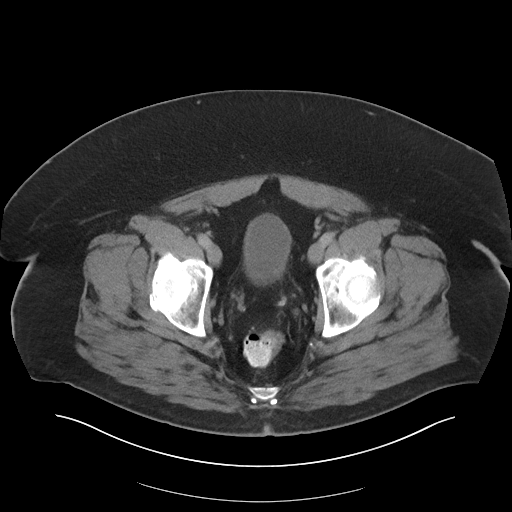
[im 31/110  soft-tissue]
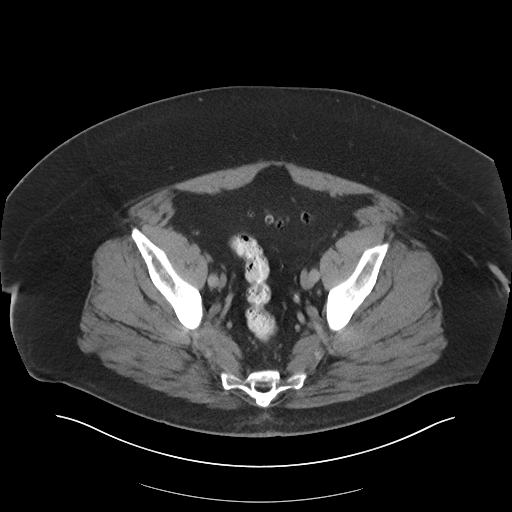
[im 37/110  soft-tissue]
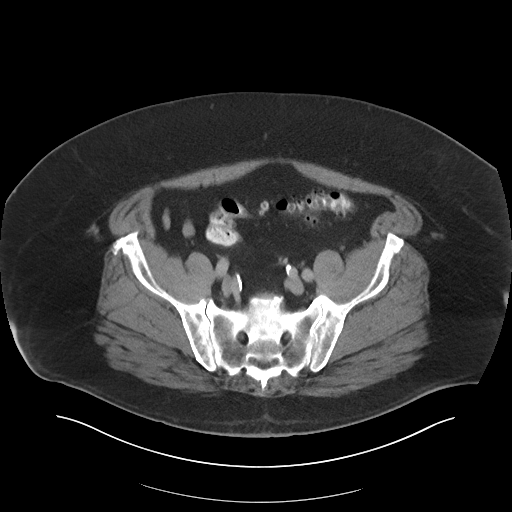
[im 49/110  soft-tissue]
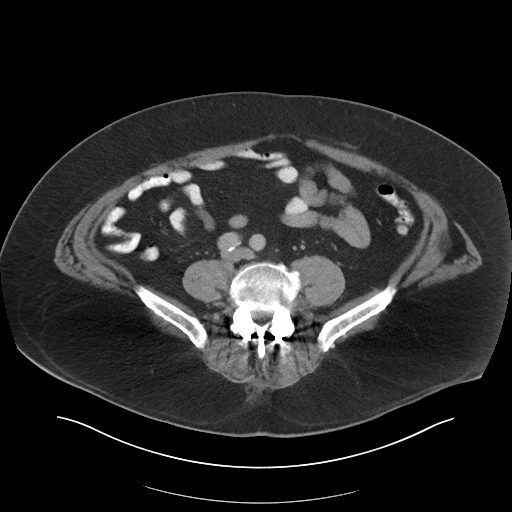
[im 55/110  soft-tissue]
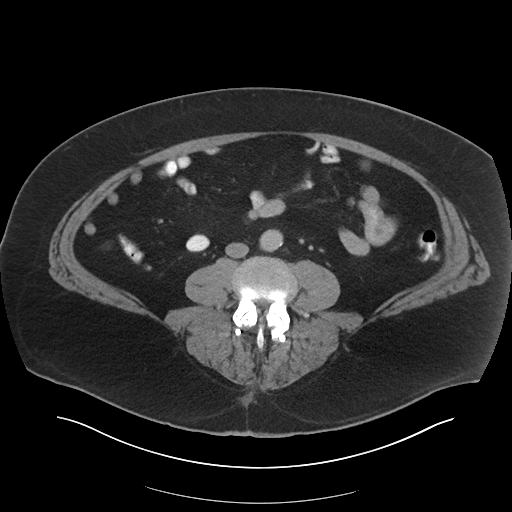
[im 61/110  soft-tissue]
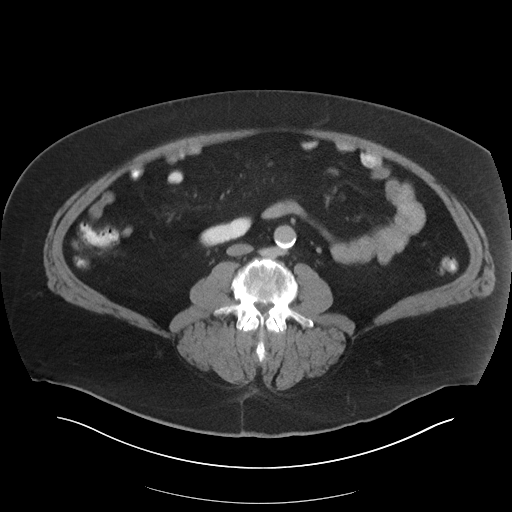
[im 73/110  soft-tissue]
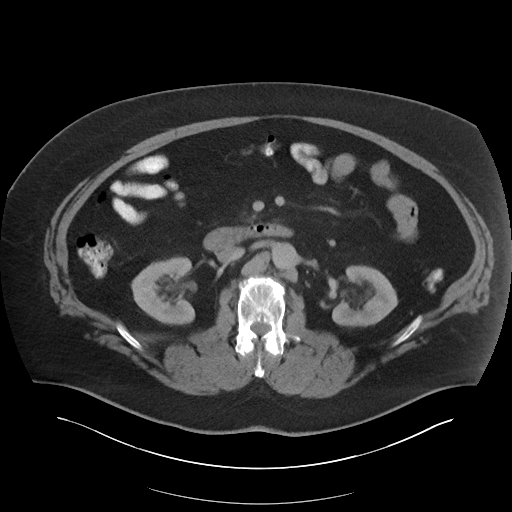
[im 73/110  bone]
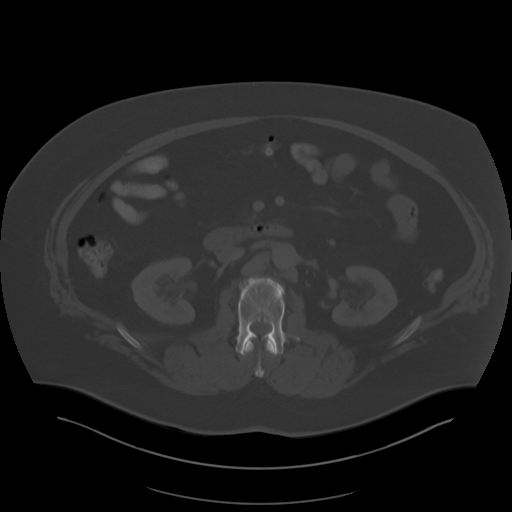
[im 79/110  soft-tissue]
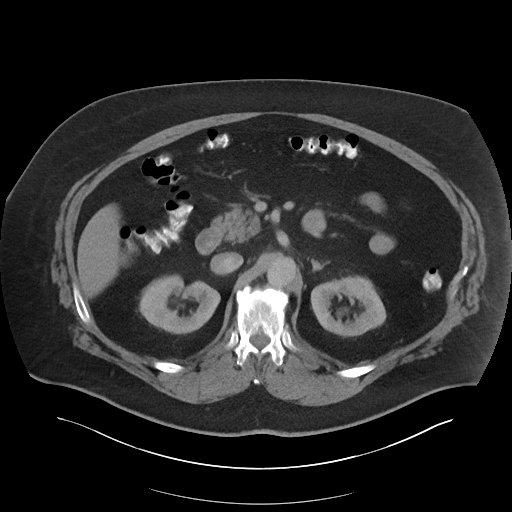
[im 85/110  soft-tissue]
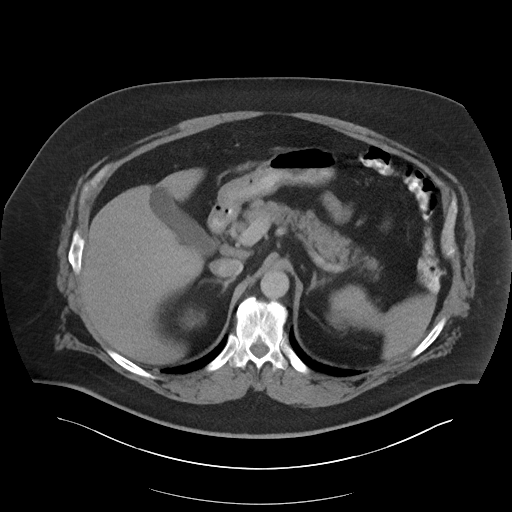
[im 97/110  soft-tissue]
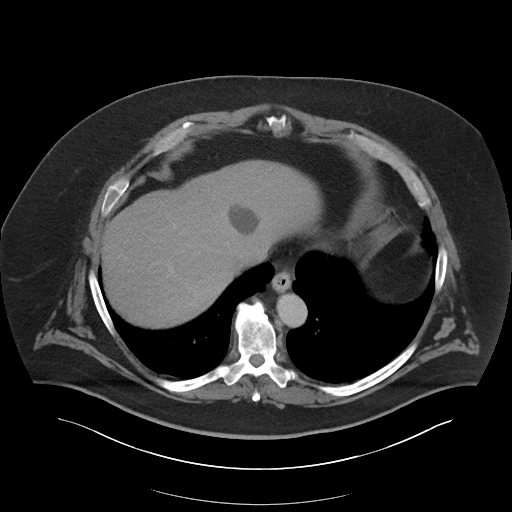
[im 103/110  soft-tissue]
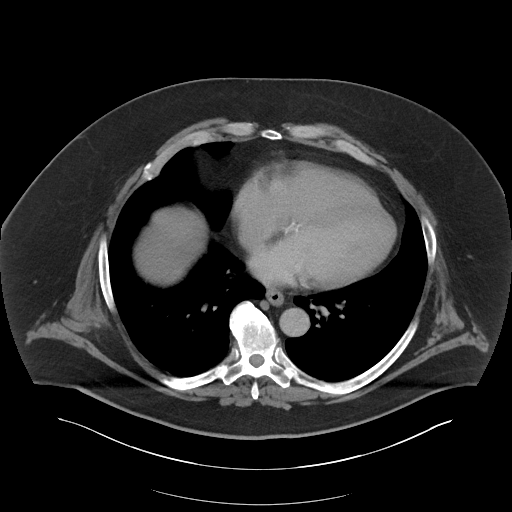

[Series 5: coronal · coronal · 0.94mm/px · 3 of 120 slices shown]
[im 40/120  soft-tissue]
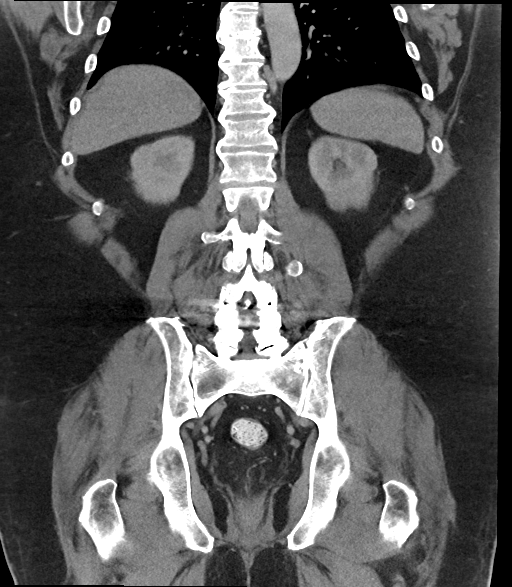
[im 53/120  soft-tissue]
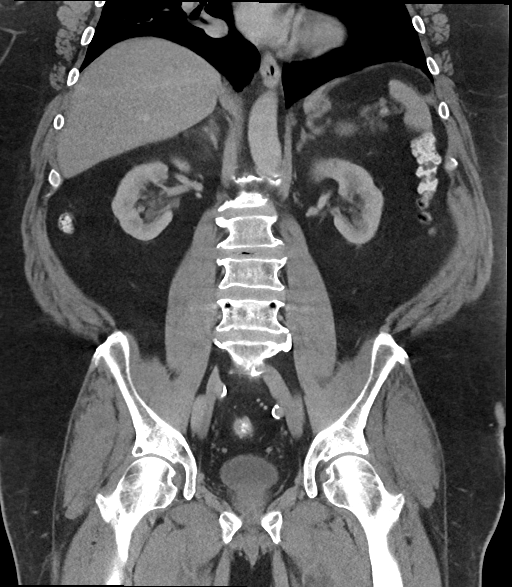
[im 67/120  soft-tissue]
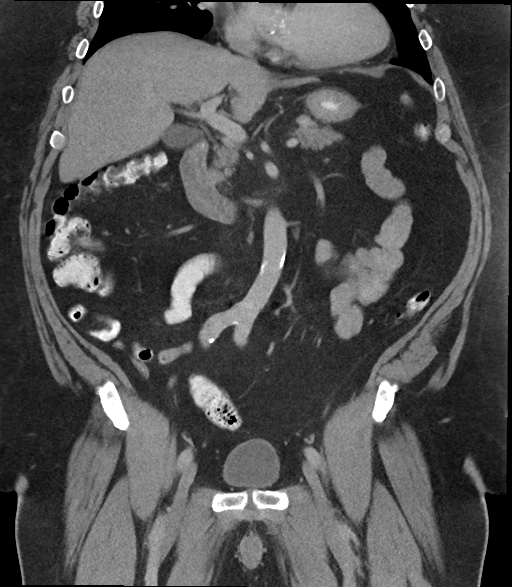

[16 of 46 positions shown; findings below may reference images not displayed]

RADIATION DOSE REDUCTION: This exam was performed according to the
departmental dose-optimization program which includes automated
exposure control, adjustment of the mA and/or kV according to
patient size and/or use of iterative reconstruction technique.

CONTRAST:  100mL OMNIPAQUE IOHEXOL 300 MG/ML  SOLN
FINDINGS: Lower chest: Stable 4 mm pulmonary nodule in the right lower lobe.

Hepatobiliary: Liver is normal in size and contour. Interval
enlargement of a 3.7 cm cyst in the left hepatic lobe segment 2. 6
mm calcific density in the fundus of the gallbladder which appears
new since previous study. No gallbladder wall thickening or
surrounding inflammatory changes. No biliary ductal dilatation.

Pancreas: Unremarkable. No pancreatic ductal dilatation or
surrounding inflammatory changes.

Spleen: Normal in size without focal abnormality.

Adrenals/Urinary Tract: Adrenal glands are unremarkable. Kidneys are
normal, without renal calculi, focal lesion, or hydronephrosis.
Bladder is unremarkable.

Stomach/Bowel: No bowel obstruction, free air or pneumatosis.
Colonic diverticulosis. Trace fat stranding adjacent to the proximal
sigmoid colon. No bowel wall edema identified. Appendix is normal.

Vascular/Lymphatic: Aortic atherosclerosis. No enlarged abdominal or
pelvic lymph nodes.

Reproductive: Prostate gland upper normal size.

Other: No ascites.

Musculoskeletal: Degenerative and postsurgical changes in the lumbar
spine. No suspicious bony lesions identified.
IMPRESSION: 1. Colonic diverticulosis. Trace fat stranding adjacent to the
proximal sigmoid colon could represent very early diverticulitis
changes, correlate clinically.
2. Hepatic cyst which is enlarged since previous study.
3. Calcification in the gallbladder may represent cholelithiasis or
wall calcification.
4. Stable small nodule in the right lower lobe, no follow-up
required if the patient is low risk. If high risk, consider
follow-up CT chest in 12 months per Fleischner criteria.

## 2023-05-07 ENCOUNTER — Ambulatory Visit (HOSPITAL_COMMUNITY): Payer: Medicare Other

## 2023-05-14 ENCOUNTER — Ambulatory Visit (HOSPITAL_BASED_OUTPATIENT_CLINIC_OR_DEPARTMENT_OTHER)
Admission: RE | Admit: 2023-05-14 | Discharge: 2023-05-14 | Disposition: A | Discharge status: Home / Self Care | Payer: Medicare Other | Source: Ambulatory Visit | Attending: Orthopedic Surgery | Admitting: Orthopedic Surgery

## 2023-05-14 DIAGNOSIS — G8929 Other chronic pain: Secondary | ICD-10-CM | POA: Diagnosis not present

## 2023-05-14 DIAGNOSIS — M25461 Effusion, right knee: Secondary | ICD-10-CM | POA: Diagnosis not present

## 2023-05-14 DIAGNOSIS — M1711 Unilateral primary osteoarthritis, right knee: Secondary | ICD-10-CM | POA: Diagnosis not present

## 2023-05-14 DIAGNOSIS — M25561 Pain in right knee: Secondary | ICD-10-CM | POA: Insufficient documentation

## 2023-05-17 ENCOUNTER — Encounter: Payer: Self-pay | Admitting: Orthopedic Surgery

## 2023-05-17 ENCOUNTER — Ambulatory Visit: Payer: Medicare Other | Admitting: Orthopedic Surgery

## 2023-05-17 VITALS — BP 160/80 | HR 88 | Ht 70.0 in | Wt 333.0 lb

## 2023-05-17 DIAGNOSIS — M8430XA Stress fracture, unspecified site, initial encounter for fracture: Secondary | ICD-10-CM

## 2023-05-17 DIAGNOSIS — M25561 Pain in right knee: Secondary | ICD-10-CM | POA: Diagnosis not present

## 2023-05-17 DIAGNOSIS — M23321 Other meniscus derangements, posterior horn of medial meniscus, right knee: Secondary | ICD-10-CM | POA: Diagnosis not present

## 2023-05-17 DIAGNOSIS — M25461 Effusion, right knee: Secondary | ICD-10-CM | POA: Diagnosis not present

## 2023-05-17 NOTE — Progress Notes (Signed)
Chief Complaint  Patient presents with   Knee Pain    R/ the pain hasn't changed.   Encounter Diagnoses  Name Primary?   Derangement of posterior horn of medial meniscus of right knee Yes   Effusion, right knee    Acute pain of right knee    Stress reaction of bone medial femoral condyle     72 year old male started having pain around November of last year progressively worsened over January presented with severe pain medial compartment of his right knee which is persistent despite taking ibuprofen and using a cane  He came in for reevaluation and discussion of the findings we see on MRI which show stress reaction medial femoral condyle medial tibia, osteoarthritis, degenerative meniscus tear medial compartment and osteoarthritis patellofemoral joint  Note no report available at this time findings are listed according to my reading  Recommend economy hinged brace extra-large  Cane left hand  Ibuprofen  Tylenol  Recheck in 4 weeks

## 2023-05-25 DIAGNOSIS — C439 Malignant melanoma of skin, unspecified: Secondary | ICD-10-CM | POA: Diagnosis not present

## 2023-05-25 DIAGNOSIS — C4359 Malignant melanoma of other part of trunk: Secondary | ICD-10-CM | POA: Diagnosis not present

## 2023-05-26 ENCOUNTER — Telehealth: Payer: Self-pay | Admitting: Orthopedic Surgery

## 2023-05-26 NOTE — Telephone Encounter (Signed)
Dr. Mort Sawyers pt - patient lvm, he is wanting to know if Dr. Romeo Apple will be calling his Cardiologist or if he needs too, he stated he has an appointment 6/17 with the cardiologist.  Also, he wants to know how much weight he needs to lose.  He stated that since 4/20 he has lost 20lbs.  [phone]

## 2023-05-27 NOTE — Telephone Encounter (Signed)
Patient will need to discuss with the cardiologist I am not sure what weight, but the BMI has to be below 40 I will call when not in clinic

## 2023-06-01 ENCOUNTER — Telehealth: Payer: Self-pay | Admitting: Cardiovascular Disease

## 2023-06-01 ENCOUNTER — Other Ambulatory Visit: Payer: Self-pay | Admitting: Orthopedic Surgery

## 2023-06-01 DIAGNOSIS — M25461 Effusion, right knee: Secondary | ICD-10-CM

## 2023-06-01 NOTE — Telephone Encounter (Signed)
Patient calling to let the dr know he has lost 15lbs since April and that clearance for a knee replacement should be coming to our office for him. Please advise

## 2023-06-01 NOTE — Telephone Encounter (Signed)
Spoke with patient and informed him once we receive clearance form the pre-op team will give him a call with further instructions and next steps.

## 2023-06-01 NOTE — Telephone Encounter (Signed)
275# would get his BMI to 40. He states he has lost 20 lbs is now 322#  I have sent letter for clearance to Dr Allyson Sabal Patient will contact Dr Allyson Sabal for appointment   To you FYI he is wanting to proceed with a knee replacement  Will further discuss with you at office visit June 17th

## 2023-06-02 ENCOUNTER — Telehealth: Payer: Self-pay | Admitting: *Deleted

## 2023-06-02 ENCOUNTER — Telehealth: Payer: Self-pay

## 2023-06-02 NOTE — Telephone Encounter (Signed)
   Pre-operative Risk Assessment    Patient Name: Antonio Colon  DOB: [birth year] MRN: [id number]      Request for Surgical Clearance    Procedure:   Right total knee replacement   Date of Surgery:  Clearance TBD                                 Surgeon:  Dr. Fuller [place] Surgeon's Group or Practice Name:  [facility]  Phone number:  [phone] Fax number:  [phone]   Type of Clearance Requested:   - Medical    Type of Anesthesia:  General    Additional requests/questions:  Please fax a copy of clearance to the surgeon's office.  Signed, Bernita Buffy   06/02/2023, 8:27 AM

## 2023-06-02 NOTE — Telephone Encounter (Signed)
Pt has been scheduled for tele pre op appt 06/11/23 @ 9:40. Med rec and consent are done.

## 2023-06-02 NOTE — Telephone Encounter (Signed)
Patient returned Pre-op call and stated can reach him at [phone] (H) or [phone] (M).

## 2023-06-02 NOTE — Telephone Encounter (Signed)
   Name: PAULETTE PIERSMA  DOB: August 06, 1951  MRN: [id number]  Primary Cardiologist: None   Preoperative team, please contact this patient and set up a phone call appointment for further preoperative risk assessment. Please obtain consent and complete medication review. Thank you for your help.  I confirm that guidance regarding antiplatelet and oral anticoagulation therapy has been completed and, if necessary, noted below.  None requested.    Carlos Levering, NP 06/02/2023, 12:30 PM [facility]

## 2023-06-02 NOTE — Telephone Encounter (Signed)
Pt has been scheduled for tele pre op appt 06/11/23 @ 9:40. Med rec and consent are done.     Patient Consent for Virtual Visit        Antonio Colon has provided verbal consent on 06/02/2023 for a virtual visit (video or telephone).   CONSENT FOR VIRTUAL VISIT FOR:  Antonio Colon  By participating in this virtual visit I agree to the following:  I hereby voluntarily request, consent and authorize [facility] and its employed or contracted physicians, physician assistants, nurse practitioners or other licensed health care professionals (the Practitioner), to provide me with telemedicine health care services (the "Services") as deemed necessary by the treating Practitioner. I acknowledge and consent to receive the Services by the Practitioner via telemedicine. I understand that the telemedicine visit will involve communicating with the Practitioner through live audiovisual communication technology and the disclosure of certain medical information by electronic transmission. I acknowledge that I have been given the opportunity to request an in-person assessment or other available alternative prior to the telemedicine visit and am voluntarily participating in the telemedicine visit.  I understand that I have the right to withhold or withdraw my consent to the use of telemedicine in the course of my care at any time, without affecting my right to future care or treatment, and that the Practitioner or I may terminate the telemedicine visit at any time. I understand that I have the right to inspect all information obtained and/or recorded in the course of the telemedicine visit and may receive copies of available information for a reasonable fee.  I understand that some of the potential risks of receiving the Services via telemedicine include:  Delay or interruption in medical evaluation due to technological equipment failure or disruption; Information transmitted may not be sufficient (e.g. poor  resolution of images) to allow for appropriate medical decision making by the Practitioner; and/or  In rare instances, security protocols could fail, causing a breach of personal health information.  Furthermore, I acknowledge that it is my responsibility to provide information about my medical history, conditions and care that is complete and accurate to the best of my ability. I acknowledge that Practitioner's advice, recommendations, and/or decision may be based on factors not within their control, such as incomplete or inaccurate data provided by me or distortions of diagnostic images or specimens that may result from electronic transmissions. I understand that the practice of medicine is not an exact science and that Practitioner makes no warranties or guarantees regarding treatment outcomes. I acknowledge that a copy of this consent can be made available to me via my patient portal [facility] MyChart), or I can request a printed copy by calling the office of [facility].    I understand that my insurance will be billed for this visit.   I have read or had this consent read to me. I understand the contents of this consent, which adequately explains the benefits and risks of the Services being provided via telemedicine.  I have been provided ample opportunity to ask questions regarding this consent and the Services and have had my questions answered to my satisfaction. I give my informed consent for the services to be provided through the use of telemedicine in my medical care

## 2023-06-02 NOTE — Telephone Encounter (Signed)
PRIMARY CARD IS DR. Allyson Sabal

## 2023-06-02 NOTE — Telephone Encounter (Signed)
Left message to call back to set up tele pre op appt.  

## 2023-06-10 NOTE — Progress Notes (Signed)
Virtual Visit via Telephone Note   Because of Antonio Colon's co-morbid illnesses, he is at least at moderate risk for complications without adequate follow up.  This format is felt to be most appropriate for this patient at this time.  The patient did not have access to video technology/had technical difficulties with video requiring transitioning to audio format only (telephone).  All issues noted in this document were discussed and addressed.  No physical exam could be performed with this format.  Please refer to the patient's chart for his consent to telehealth for [facility].  Evaluation Performed:  Preoperative cardiovascular risk assessment _____________   Date:  06/11/2023   Patient ID:  Antonio Colon, DOB Apr 19, 1951, MRN [id number] Patient Location:  Home Provider location:   Office  Primary Care Provider:  Benita Stabile, MD Primary Cardiologist:  Antonio Batty, MD  Chief Complaint / Patient Profile   72 y.o. y/o male with a h/o morbid obesity, s/p LAD in 2010, HTN, HLD, OSA on CPAP who is pending right total knee replacement and presents today for telephonic preoperative cardiovascular risk assessment.  History of Present Illness    Antonio Colon is a 72 y.o. male who presents via audio/video conferencing for a telehealth visit today.  Pt was last seen in cardiology clinic on 01/05/23 by Antonio Colon.  At that time Antonio Colon was doing well.  The patient is now pending procedure as outlined above. Since his last visit, he  denies chest pain, shortness of breath, lower extremity edema, fatigue, palpitations, melena, hematuria, hemoptysis, diaphoresis, weakness, presyncope, syncope, orthopnea, and PND. He is somewhat limited by knee pain but is still able to achieve > 4 METS activity with regular walking achieving > 1 mile in steps daily without concerning cardiac symptoms.   Past Medical History    Past Medical History:  Diagnosis Date   CAD (coronary artery disease)     stents   Fracture, rib 05/2021   right side   GERD (gastroesophageal reflux disease)    Heart disease    Heart murmur    childhood   Hiatal hernia    History of stress test 01/27/2012   Normal study. No significant ischemia demonstration, this low risk scan, no significant change compared to previous study.   Hypercholesteremia    Hyperlipidemia    Hypertension    Myocardial infarction (HCC) 2010   Obesity    Pneumonia 2019   Sleep apnea    cpap at night   Past Surgical History:  Procedure Laterality Date   AXILLARY LYMPH NODE DISSECTION Right 07/2021   BACK SURGERY  10/2019   Dr. Franky Colon: right L4-5 laminectomy   BIOPSY  06/02/2022   Procedure: BIOPSY;  Surgeon: Antonio Colon, Antonio Boom, MD;  [facility];  Service: Gastroenterology;;   CARDIAC CATHETERIZATION  10/2009   CAD stents to the LAD using an [employer]avor drug eluting (3x14mm) by Dr Antonio Colon, he had normal circumflex and RCA at the time.   COLONOSCOPY  06/15/2012   COLONOSCOPY N/A 01/09/2019   Procedure: COLONOSCOPY;  Surgeon: Antonio Hippo, MD;  [facility]: [facility];  Service: Endoscopy;  Laterality: N/A;  2:10pm   COLONOSCOPY WITH PROPOFOL N/A 06/02/2022   Procedure: COLONOSCOPY WITH PROPOFOL;  Surgeon: Antonio Frame, MD;  [facility]: [facility];  Service: Gastroenterology;  Laterality: N/A;  730 ASA 2   ESOPHAGOGASTRODUODENOSCOPY N/A 11/15/2015   Procedure: ESOPHAGOGASTRODUODENOSCOPY (EGD);  Surgeon: Antonio Hippo, MD;  [facility]  [facility];  Service: Endoscopy;  Laterality: N/A;  1:10 - moved to 12:55 - Ann notified pt   MELANOMA EXCISION  07/2021   back   POLYPECTOMY  01/09/2019   Procedure: POLYPECTOMY;  Surgeon: Antonio Hippo, MD;  [facility]: [facility];  Service: Endoscopy;;  colon   POLYPECTOMY  06/02/2022   Procedure: POLYPECTOMY;  Surgeon: Antonio Frame, MD;  [facility]: [facility];  Service: Gastroenterology;;   TONSILLECTOMY      Allergies  No Known  Allergies  Home Medications    Prior to Admission medications   Medication Sig Start Date End Date Taking? Authorizing Provider  ALPRAZolam (XANAX) 0.25 MG tablet Take 0.25 mg by mouth at bedtime.    [provider]  Ascorbic Acid (VITAMIN C PO) Take 1 tablet by mouth daily.    [provider]  aspirin EC 81 MG tablet Take 1 tablet (81 mg total) by mouth daily. 01/10/19   Colon, Antonio Maxcy, MD  ATIVAN 0.5 MG tablet Take 0.5 tablets (0.25 mg total) by mouth Colon 30 (thirty) minutes as needed for anxiety. 1st dose 30 minutes prior to MRI, repeat if needed. 10/30/21   Colon, Antonio Pinch, NP  diclofenac (CATAFLAM) 50 MG tablet Take 1 tablet (50 mg total) by mouth 2 (two) times daily. 04/19/23   Antonio Hearing, MD  ibuprofen (ADVIL) 800 MG tablet TAKE (1) TABLET BY MOUTH Colon EIGHT HOURS AS NEEDED. 06/02/23   Antonio Hearing, MD  losartan-hydrochlorothiazide (HYZAAR) 100-12.5 MG tablet Take 1 tablet by mouth daily. 03/09/22   [provider]  Multiple Vitamin (MULTIVITAMIN WITH MINERALS) TABS tablet Take 1 tablet by mouth daily.    [provider]  MYRBETRIQ 50 MG TB24 tablet Take 50 mg by mouth daily.  05/27/19   [provider]  nebivolol (BYSTOLIC) 5 MG tablet TAKE 1 TABLET BY MOUTH ONCE DAILY. 03/02/23   Antonio Gess, MD  nitroGLYCERIN (NITROSTAT) 0.4 MG SL tablet Place 1 tablet (0.4 mg total) under the tongue Colon 5 (five) minutes as needed. For chest pain 07/05/13   Antonio Gess, MD  rosuvastatin (CRESTOR) 5 MG tablet TAKE 1 TABLET BY MOUTH DAILY AT 6PM FOR CHOLESTEROL. 04/06/23   Antonio Gess, MD    Physical Exam    Vital Signs:  Antonio Colon does not have vital signs available for review today.  Given telephonic nature of communication, physical exam is limited. AAOx3. NAD. Normal affect.  Speech and respirations are unlabored.  Accessory Clinical Findings    None  Assessment & Plan    1.  Preoperative Cardiovascular Risk  Assessment: According to the Revised Cardiac Risk Index (RCRI), his Perioperative Risk of Major Cardiac Event is (%): 6.6. His Functional Capacity in METs is: 6.05 according to the Colon Activity Status Index (DASI). The patient is doing well from a cardiac perspective. Therefore, based on ACC/AHA guidelines, the patient would be at acceptable risk for the planned procedure without further cardiovascular testing.   The patient was advised that if he develops new symptoms prior to surgery to contact our office to arrange for a follow-up visit, and he verbalized understanding.  Per office protocol, he may hold aspirin for 5-7 days prior to procedure and should resume as soon as hemodynamically stable postoperatively.   A copy of this note will be routed to requesting surgeon.  Time:   Today, I have spent 10 minutes with the patient with telehealth technology discussing medical history, symptoms,  and management plan.    Levi Aland, NP-C  06/11/2023, 9:37 AM [address], [address] Office [phone] Fax [phone]

## 2023-06-11 ENCOUNTER — Ambulatory Visit: Payer: Medicare Other | Attending: Cardiology | Admitting: Nurse Practitioner

## 2023-06-11 DIAGNOSIS — Z01810 Encounter for preprocedural cardiovascular examination: Secondary | ICD-10-CM | POA: Diagnosis not present

## 2023-06-14 ENCOUNTER — Encounter: Payer: Self-pay | Admitting: Orthopedic Surgery

## 2023-06-14 ENCOUNTER — Ambulatory Visit: Payer: Medicare Other | Admitting: Orthopedic Surgery

## 2023-06-14 VITALS — Ht 70.0 in | Wt 317.0 lb

## 2023-06-14 DIAGNOSIS — M23321 Other meniscus derangements, posterior horn of medial meniscus, right knee: Secondary | ICD-10-CM | POA: Diagnosis not present

## 2023-06-14 DIAGNOSIS — Z01818 Encounter for other preprocedural examination: Secondary | ICD-10-CM | POA: Diagnosis not present

## 2023-06-14 NOTE — Patient Instructions (Signed)
Your surgery will be at [facility] by Dr Harrison  The hospital will contact you with a preoperative appointment to discuss Anesthesia.  Please arrive on time or 15 minutes early for the preoperative appointment, they have a very tight schedule if you are late or do not come in your surgery will be cancelled.  The phone number is [phone]. Please bring your medications with you for the appointment. They will tell you the arrival time and medication instructions when you have your preoperative evaluation. Do not wear nail polish the day of your surgery and if you take Phentermine you need to stop this medication ONE WEEK prior to your surgery. If you take Invokana, Farxiga, Jardiance, or Steglatro) - Hold 72 hours before the procedure.  If you take Ozempic,  Bydureon or Trulicity do not take for 8 days before your surgery. If you take Victoza, Rybelsis, Saxenda or Adlyxi stop 24 hours before the procedure.  Please arrive at the hospital 2 hours before procedure if scheduled at 9:30 or later in the day or at the time the nurse tells you at your preoperative visit.   If you have my chart do not use the time given in my chart use the time given to you by the nurse during your preoperative visit.   Your surgery  time may change. Please be available for phone calls the day of your surgery and the day before. The Short Stay department may need to discuss changes about your surgery time. Not reaching the you could lead to procedure delays and possible cancellation.  You must have a ride home and someone to stay with you for 24 to 48 hours. The person taking you home will receive and sign for the your discharge instructions.  Please be prepared to give your support person's name and telephone number to Central Registration. Dr Harrison will need that name and phone number post procedure.   

## 2023-06-14 NOTE — Progress Notes (Signed)
Chief Complaint  Patient presents with   Knee Pain    Right/ discuss surgery has gotten cardiac clearance    Antonio Colon has gotten his preop parents from cardiology he is not on a blood thinner his knee still bothering him.  Plan arthroscopy right knee partial medial meniscectomy  4 weeks out of work  PT initially at home  Subsequent surgery may be necessary in the future when his weight is about 280s currently 317  Past Medical History:  Diagnosis Date   CAD (coronary artery disease)    stents   Fracture, rib 05/2021   right side   GERD (gastroesophageal reflux disease)    Heart disease    Heart murmur    childhood   Hiatal hernia    History of stress test 01/27/2012   Normal study. No significant ischemia demonstration, this low risk scan, no significant change compared to previous study.   Hypercholesteremia    Hyperlipidemia    Hypertension    Myocardial infarction (HCC) 2010   Obesity    Pneumonia 2019   Sleep apnea    cpap at night   Past Surgical History:  Procedure Laterality Date   AXILLARY LYMPH NODE DISSECTION Right 07/2021   BACK SURGERY  10/2019   Dr. Franky Macho: right L4-5 laminectomy   BIOPSY  06/02/2022   Procedure: BIOPSY;  Surgeon: Marguerita Merles, Reuel Boom, MD;  Location: [facility];  Service: Gastroenterology;;   CARDIAC CATHETERIZATION  10/2009   CAD stents to the LAD using an [employer] drug eluting (3x4mm) by Dr Clarene Duke, he had normal circumflex and RCA at the time.   COLONOSCOPY  06/15/2012   COLONOSCOPY N/A 01/09/2019   Procedure: COLONOSCOPY;  Surgeon: Malissa Hippo, MD;  Location: [facility];  Service: Endoscopy;  Laterality: N/A;  2:10pm   COLONOSCOPY WITH PROPOFOL N/A 06/02/2022   Procedure: COLONOSCOPY WITH PROPOFOL;  Surgeon: Dolores Frame, MD;  Location: [facility];  Service: Gastroenterology;  Laterality: N/A;  730 ASA 2   ESOPHAGOGASTRODUODENOSCOPY N/A 11/15/2015   Procedure: ESOPHAGOGASTRODUODENOSCOPY (EGD);  Surgeon:  Malissa Hippo, MD;  Location: [facility];  Service: Endoscopy;  Laterality: N/A;  1:10 - moved to 12:55 - Ann notified pt   MELANOMA EXCISION  07/2021   back   POLYPECTOMY  01/09/2019   Procedure: POLYPECTOMY;  Surgeon: Malissa Hippo, MD;  Location: [facility];  Service: Endoscopy;;  colon   POLYPECTOMY  06/02/2022   Procedure: POLYPECTOMY;  Surgeon: Marguerita Merles, Reuel Boom, MD;  Location: [facility];  Service: Gastroenterology;;   TONSILLECTOMY     Family History  Problem Relation Age of Onset   Heart disease Mother    Healthy Sister    Social History   Tobacco Use   Smoking status: Former    Types: Cigarettes, Cigars    Quit date: 01/25/2009    Years since quitting: 14.3    Passive exposure: Past   Smokeless tobacco: Never  Vaping Use   Vaping Use: Never used  Substance Use Topics   Alcohol use: No   Drug use: No   Current Outpatient Medications  Medication Instructions   ALPRAZolam (XANAX) 0.25 mg, Oral, Daily at bedtime   Ascorbic Acid (VITAMIN C PO) 1 tablet, Oral, Daily   aspirin EC 81 mg, Oral, Daily   Ativan 0.25 mg, Oral, Every 30 min PRN, 1st dose 30 minutes prior to MRI, repeat if needed.   ibuprofen (ADVIL) 800 MG tablet TAKE (1) TABLET BY MOUTH EVERY  EIGHT HOURS AS NEEDED.   losartan-hydrochlorothiazide (HYZAAR) 100-12.5 MG tablet 1 tablet, Oral, Daily   Multiple Vitamin (MULTIVITAMIN WITH MINERALS) TABS tablet 1 tablet, Oral, Daily   Myrbetriq 50 mg, Oral, Daily   nebivolol (BYSTOLIC) 5 mg, Oral, Daily   nitroGLYCERIN (NITROSTAT) 0.4 mg, Sublingual, Every 5 min PRN, For chest pain   rosuvastatin (CRESTOR) 5 MG tablet TAKE 1 TABLET BY MOUTH DAILY AT 6PM FOR CHOLESTEROL.    Physical Exam Vitals and nursing note reviewed.  Constitutional:      Appearance: Normal appearance.  HENT:     Head: Normocephalic and atraumatic.  Eyes:     General: No scleral icterus.       Right eye: No discharge.        Left eye: No discharge.     Extraocular  Movements: Extraocular movements intact.     Conjunctiva/sclera: Conjunctivae normal.     Pupils: Pupils are equal, round, and reactive to light.  Cardiovascular:     Rate and Rhythm: Normal rate.     Pulses: Normal pulses.  Skin:    General: Skin is warm and dry.     Capillary Refill: Capillary refill takes less than 2 seconds.  Neurological:     General: No focal deficit present.     Mental Status: He is alert and oriented to person, place, and time.  Psychiatric:        Mood and Affect: Mood normal.        Behavior: Behavior normal.        Thought Content: Thought content normal.        Judgment: Judgment normal.    Right knee medial tenderness with effusion skin is normal Limited range of motion secondary to pain flexion is past 90 degrees No instability detected muscle tone normal  Imaging   Plain films arthritis  MRI torn medial meniscus   IMPRESSION: Tricompartment osteoarthritis, severe in the medial compartment with full-thickness cartilage loss along the weight-bearing surface. Extensive underlying subchondral marrow edema in medial femoral condyle and tibial plateau with probable associated small subchondral insufficiency fractures.   Complex degenerative tearing of the medial meniscal body with substance loss and nondisplaced horizontal tear of the posterior horn.   Free edge and undersurface fraying of the posterior horn lateral meniscus near the horn-body junction.   Moderate-large size joint effusion.   Prepatellar bursitis with bursal distension measuring up to 2.4 x 0.4 x 2.4 cm.     Electronically Signed   By: Caprice Renshaw M.D.   On: 05/19/2023 15:28

## 2023-06-18 NOTE — Patient Instructions (Signed)
Antonio Colon  06/18/2023     @PREFPERIOPPHARMACY @   Your procedure is scheduled on  06/22/2023.   Report to [facility] at  0830  A.M.   Call this number if you have problems the morning of surgery:  [phone]  If you experience any cold or flu symptoms such as cough, fever, chills, shortness of breath, etc. between now and your scheduled surgery, please notify us at the above number.   Remember:  Do not eat or drink after midnight.      Take these medicines the morning of surgery with A SIP OF WATER                   ativan (if needed), bystolic, myrbetriq.     Do not wear jewelry, make-up or nail polish, including gel polish,  artificial nails, or any other type of covering on natural nails (fingers and  toes).  Do not wear lotions, powders, or perfumes, or deodorant.  Do not shave 48 hours prior to surgery.  Men may shave face and neck.  Do not bring valuables to the hospital.  [facility] is not responsible for any belongings or valuables.  Contacts, dentures or bridgework may not be worn into surgery.  Leave your suitcase in the car.  After surgery it may be brought to your room.  For patients admitted to the hospital, discharge time will be determined by your treatment team.  Patients discharged the day of surgery will not be allowed to drive home and must have someone with them for 24 hours.    Special instructions:   DO NOT smoke tobacco or vape for 24 hours before your procedure.  Please read over the following fact sheets that you were given. Pain Booklet, Coughing and Deep Breathing, Surgical Site Infection Prevention, Anesthesia Post-op Instructions, and Care and Recovery After Surgery        Arthroscopic Knee Ligament Repair, Care After This sheet gives you information about how to care for yourself after your procedure. Your health care provider may also give you more specific instructions. If you have problems or questions, contact your  health care provider. What can I expect after the procedure? After the procedure, it is common to have: Soreness or pain in your knee. Bruising and swelling on your knee, calf, and ankle for 3-4 days. A small amount of fluid coming from the incisions. Follow these instructions at home: Medicines Take over-the-counter and prescription medicines only as told by your health care provider. Ask your health care provider if the medicine prescribed to you: Requires you to avoid driving or using machinery. Can cause constipation. You may need to take these actions to prevent or treat constipation: Drink enough fluid to keep your urine pale yellow. Take over-the-counter or prescription medicines. Eat foods that are high in fiber, such as beans, whole grains, and fresh fruits and vegetables. Limit foods that are high in fat and processed sugars, such as fried or sweet foods. If you have a brace or immobilizer: Wear it as told by your health care provider. Remove it only as told by your health care provider. Loosen it if your toes tingle, become numb, or turn cold and blue. Keep it clean and dry. Ask your health care provider when it is safe to drive. Bathing Do not take baths, swim, or use a hot tub until your health care provider approves. Keep your bandage (dressing) dry until your health care provider  says that it can be removed. If the brace or immobilizer is not waterproof: Do not let it get wet. Cover it with a watertight covering when you take a bath or shower. Incision care  Follow instructions from your health care provider about how to take care of your incisions. Make sure you: Wash your hands with soap and water for at least 20 seconds before and after you change your dressing. If soap and water are not available, use hand sanitizer. Change your dressing as told by your health care provider. Leave stitches (sutures), skin glue, or adhesive strips in place. These skin closures may  need to stay in place for 2 weeks or longer. If adhesive strip edges start to loosen and curl up, you may trim the loose edges. Do not remove adhesive strips completely unless your health care provider tells you to do that. Check your incision areas every day for signs of infection. Check for: Redness. More swelling or pain. Blood or more fluid. Warmth. Pus or a bad smell. Managing pain, stiffness, and swelling  If directed, put ice on the affected area. To do this: If you have a removable brace or immobilizer, remove it as told by your health care provider. Put ice in a plastic bag. Place a towel between your skin and the bag. Leave the ice on for 20 minutes, 2-3 times a day. Remove the ice if your skin turns bright red. This is very important. If you cannot feel pain, heat, or cold, you have a greater risk of damage to the area. Move your toes often to reduce stiffness and swelling. Raise (elevate) the injured area above the level of your heart while you are sitting or lying down. Activity Do not use your knee to support your body weight until your health care provider says that you can. Use crutches or other devices as told by your health care provider. Do physical therapy exercises as told by your health care provider. Physical therapy will help you regain movement and strength in your knee. Follow instructions from your health care provider about: When you may start motion exercises. When you may start riding a stationary bike and doing other low-impact activities. When you may start to jog and do other high-impact activities. Do not lift anything that is heavier than 10 lb (4.5 kg), or the limit that you are told, until your health care provider says that it is safe. Ask your health care provider what activities are safe for you. General instructions Do not use any products that contain nicotine or tobacco, such as cigarettes, e-cigarettes, and chewing tobacco. These can delay  healing. If you need help quitting, ask your health care provider. Wear compression stockings as told by your health care provider. These stockings help to prevent blood clots and reduce swelling in your legs. Keep all follow-up visits. This is important. Contact a health care provider if: You have any of these signs of infection: Redness around an incision. Blood or more fluid coming from an incision. Warmth coming from an incision. Pus or a bad smell coming from an incision. More swelling or pain in your knee. A fever or chills. You have pain that does not get better with medicine. Your incision opens up. Get help right away if: You have trouble breathing. You have chest pain. You have increased pain or swelling in your calf or at the back of your knee. You have numbness and tingling near the knee joint or in the foot, ankle,  or toes. You notice that your foot or toes look darker than normal or are cooler than normal. These symptoms may represent a serious problem that is an emergency. Do not wait to see if the symptoms will go away. Get medical help right away. Call your local emergency services (911 in the [place] Do not drive yourself to the hospital. Summary After the procedure, it is common to have knee pain with bruising and swelling on your knee, calf, and ankle. Icing your knee and raising your leg above the level of your heart will help control the pain and swelling. Do physical therapy exercises as told by your health care provider. Physical therapy will help you regain movement and strength in your knee. This information is not intended to replace advice given to you by your health care provider. Make sure you discuss any questions you have with your health care provider. Document Revised: 05/13/2020 Document Reviewed: 05/13/2020 Elsevier Patient Education  2024 [employer] General Anesthesia, Adult, Care After The following information offers guidance on how to care for  yourself after your procedure. Your health care provider may also give you more specific instructions. If you have problems or questions, contact your health care provider. What can I expect after the procedure? After the procedure, it is common for people to: Have pain or discomfort at the IV site. Have nausea or vomiting. Have a sore throat or hoarseness. Have trouble concentrating. Feel cold or chills. Feel weak, sleepy, or tired (fatigue). Have soreness and body aches. These can affect parts of the body that were not involved in surgery. Follow these instructions at home: For the time period you were told by your health care provider:  Rest. Do not participate in activities where you could fall or become injured. Do not drive or use machinery. Do not drink alcohol. Do not take sleeping pills or medicines that cause drowsiness. Do not make important decisions or sign legal documents. Do not take care of children on your own. General instructions Drink enough fluid to keep your urine pale yellow. If you have sleep apnea, surgery and certain medicines can increase your risk for breathing problems. Follow instructions from your health care provider about wearing your sleep device: Anytime you are sleeping, including during daytime naps. While taking prescription pain medicines, sleeping medicines, or medicines that make you drowsy. Return to your normal activities as told by your health care provider. Ask your health care provider what activities are safe for you. Take over-the-counter and prescription medicines only as told by your health care provider. Do not use any products that contain nicotine or tobacco. These products include cigarettes, chewing tobacco, and vaping devices, such as e-cigarettes. These can delay incision healing after surgery. If you need help quitting, ask your health care provider. Contact a health care provider if: You have nausea or vomiting that does not get  better with medicine. You vomit every time you eat or drink. You have pain that does not get better with medicine. You cannot urinate or have bloody urine. You develop a skin rash. You have a fever. Get help right away if: You have trouble breathing. You have chest pain. You vomit blood. These symptoms may be an emergency. Get help right away. Call 911. Do not wait to see if the symptoms will go away. Do not drive yourself to the hospital. Summary After the procedure, it is common to have a sore throat, hoarseness, nausea, vomiting, or to feel weak, sleepy, or fatigue. For  the time period you were told by your health care provider, do not drive or use machinery. Get help right away if you have difficulty breathing, have chest pain, or vomit blood. These symptoms may be an emergency. This information is not intended to replace advice given to you by your health care provider. Make sure you discuss any questions you have with your health care provider. Document Revised: 03/13/2022 Document Reviewed: 03/13/2022 Elsevier Patient Education  2024 [employer] How to Use Chlorhexidine Before Surgery Chlorhexidine gluconate (CHG) is a germ-killing (antiseptic) solution that is used to clean the skin. It can get rid of the bacteria that normally live on the skin and can keep them away for about 24 hours. To clean your skin with CHG, you may be given: A CHG solution to use in the shower or as part of a sponge bath. A prepackaged cloth that contains CHG. Cleaning your skin with CHG may help lower the risk for infection: While you are staying in the intensive care unit of the hospital. If you have a vascular access, such as a central line, to provide short-term or long-term access to your veins. If you have a catheter to drain urine from your bladder. If you are on a ventilator. A ventilator is a machine that helps you breathe by moving air in and out of your lungs. After surgery. What are the  risks? Risks of using CHG include: A skin reaction. Hearing loss, if CHG gets in your ears and you have a perforated eardrum. Eye injury, if CHG gets in your eyes and is not rinsed out. The CHG product catching fire. Make sure that you avoid smoking and flames after applying CHG to your skin. Do not use CHG: If you have a chlorhexidine allergy or have previously reacted to chlorhexidine. On babies younger than 65 months of age. How to use CHG solution Use CHG only as told by your health care provider, and follow the instructions on the label. Use the full amount of CHG as directed. Usually, this is one bottle. During a shower Follow these steps when using CHG solution during a shower (unless your health care provider gives you different instructions): Start the shower. Use your normal soap and shampoo to wash your face and hair. Turn off the shower or move out of the shower stream. Pour the CHG onto a clean washcloth. Do not use any type of brush or rough-edged sponge. Starting at your neck, lather your body down to your toes. Make sure you follow these instructions: If you will be having surgery, pay special attention to the part of your body where you will be having surgery. Scrub this area for at least 1 minute. Do not use CHG on your head or face. If the solution gets into your ears or eyes, rinse them well with water. Avoid your genital area. Avoid any areas of skin that have broken skin, cuts, or scrapes. Scrub your back and under your arms. Make sure to wash skin folds. Let the lather sit on your skin for 1-2 minutes or as long as told by your health care provider. Thoroughly rinse your entire body in the shower. Make sure that all body creases and crevices are rinsed well. Dry off with a clean towel. Do not put any substances on your body afterward--such as powder, lotion, or perfume--unless you are told to do so by your health care provider. Only use lotions that are recommended by  the manufacturer. Put on clean clothes  or pajamas. If it is the night before your surgery, sleep in clean sheets.  During a sponge bath Follow these steps when using CHG solution during a sponge bath (unless your health care provider gives you different instructions): Use your normal soap and shampoo to wash your face and hair. Pour the CHG onto a clean washcloth. Starting at your neck, lather your body down to your toes. Make sure you follow these instructions: If you will be having surgery, pay special attention to the part of your body where you will be having surgery. Scrub this area for at least 1 minute. Do not use CHG on your head or face. If the solution gets into your ears or eyes, rinse them well with water. Avoid your genital area. Avoid any areas of skin that have broken skin, cuts, or scrapes. Scrub your back and under your arms. Make sure to wash skin folds. Let the lather sit on your skin for 1-2 minutes or as long as told by your health care provider. Using a different clean, wet washcloth, thoroughly rinse your entire body. Make sure that all body creases and crevices are rinsed well. Dry off with a clean towel. Do not put any substances on your body afterward--such as powder, lotion, or perfume--unless you are told to do so by your health care provider. Only use lotions that are recommended by the manufacturer. Put on clean clothes or pajamas. If it is the night before your surgery, sleep in clean sheets. How to use CHG prepackaged cloths Only use CHG cloths as told by your health care provider, and follow the instructions on the label. Use the CHG cloth on clean, dry skin. Do not use the CHG cloth on your head or face unless your health care provider tells you to. When washing with the CHG cloth: Avoid your genital area. Avoid any areas of skin that have broken skin, cuts, or scrapes. Before surgery Follow these steps when using a CHG cloth to clean before surgery  (unless your health care provider gives you different instructions): Using the CHG cloth, vigorously scrub the part of your body where you will be having surgery. Scrub using a back-and-forth motion for 3 minutes. The area on your body should be completely wet with CHG when you are done scrubbing. Do not rinse. Discard the cloth and let the area air-dry. Do not put any substances on the area afterward, such as powder, lotion, or perfume. Put on clean clothes or pajamas. If it is the night before your surgery, sleep in clean sheets.  For general bathing Follow these steps when using CHG cloths for general bathing (unless your health care provider gives you different instructions). Use a separate CHG cloth for each area of your body. Make sure you wash between any folds of skin and between your fingers and toes. Wash your body in the following order, switching to a new cloth after each step: The front of your neck, shoulders, and chest. Both of your arms, under your arms, and your hands. Your stomach and groin area, avoiding the genitals. Your right leg and foot. Your left leg and foot. The back of your neck, your back, and your buttocks. Do not rinse. Discard the cloth and let the area air-dry. Do not put any substances on your body afterward--such as powder, lotion, or perfume--unless you are told to do so by your health care provider. Only use lotions that are recommended by the manufacturer. Put on clean clothes or pajamas. Contact  a health care provider if: Your skin gets irritated after scrubbing. You have questions about using your solution or cloth. You swallow any chlorhexidine. Call your local poison control center (819-118-3782 in the [place] Get help right away if: Your eyes itch badly, or they become very red or swollen. Your skin itches badly and is red or swollen. Your hearing changes. You have trouble seeing. You have swelling or tingling in your mouth or throat. You have  trouble breathing. These symptoms may represent a serious problem that is an emergency. Do not wait to see if the symptoms will go away. Get medical help right away. Call your local emergency services (911 in the [place] Do not drive yourself to the hospital. Summary Chlorhexidine gluconate (CHG) is a germ-killing (antiseptic) solution that is used to clean the skin. Cleaning your skin with CHG may help to lower your risk for infection. You may be given CHG to use for bathing. It may be in a bottle or in a prepackaged cloth to use on your skin. Carefully follow your health care provider's instructions and the instructions on the product label. Do not use CHG if you have a chlorhexidine allergy. Contact your health care provider if your skin gets irritated after scrubbing. This information is not intended to replace advice given to you by your health care provider. Make sure you discuss any questions you have with your health care provider. Document Revised: 04/13/2022 Document Reviewed: 02/24/2021 Elsevier Patient Education  2023 [employer].

## 2023-06-18 NOTE — H&P (Addendum)
Chief Complaint  Patient presents with   Knee Pain      Right/ discuss surgery has gotten cardiac clearance   Ahsan continues to have pain in his right knee he was able to obtain clearance from cardiology he is not on the blood thinner  Based on his history and physical exam findings and MRI findings he has decided to proceed with arthroscopic surgery on his right knee for partial medial meniscectomy.   In review he initially presented with the following history 72 year old male previously seen in [place] but no treatment instituted when x-ray showed arthritis of the knee.  He has reported no injury although 1 part of his records that he may have bumped his knee back in November when he saw Dr. Deno Etienne   He complains of pain swelling right knee.  He says it is hard to walk.  He is having to use a cane.  He says the area in front of his knee was aspirated twice where they got 2 cc of fluid and put him on prednisone twice and he is taking 2 Advil in the morning 2 Advil at night and he is not getting good pain relief   However, the area that was drained was a prepatellar bursa at this current complaint seems to involve his whole knee joint   He was referred to [place] by a friend who had knee replacement surgery done and was happy with his result   He has no Dron drug allergies   He does have a stent in his heart he has sleep apnea uses a CPAP machine    Postop plan 4 weeks out of work   PT initially at home   Subsequent surgery may be necessary in the future when his weight is about 280s currently 317       Past Medical History:  Diagnosis Date   CAD (coronary artery disease)      stents   Fracture, rib 05/2021    right side   GERD (gastroesophageal reflux disease)     Heart disease     Heart murmur      childhood   Hiatal hernia     History of stress test 01/27/2012    Normal study. No significant ischemia demonstration, this low risk scan, no significant change compared to  previous study.   Hypercholesteremia     Hyperlipidemia     Hypertension     Myocardial infarction (HCC) 2010   Obesity     Pneumonia 2019   Sleep apnea      cpap at night         Past Surgical History:  Procedure Laterality Date   AXILLARY LYMPH NODE DISSECTION Right 07/2021   BACK SURGERY   10/2019    Dr. Franky Macho: right L4-5 laminectomy   BIOPSY   06/02/2022    Procedure: BIOPSY;  Surgeon: Marguerita Merles, Reuel Boom, MD;  Location: [facility];  Service: Gastroenterology;;   CARDIAC CATHETERIZATION   10/2009    CAD stents to the LAD using an Endeavor drug eluting (3x87mm) by Dr Clarene Duke, he had normal circumflex and RCA at the time.   COLONOSCOPY   06/15/2012   COLONOSCOPY N/A 01/09/2019    Procedure: COLONOSCOPY;  Surgeon: Malissa Hippo, MD;  Location: [facility];  Service: Endoscopy;  Laterality: N/A;  2:10pm   COLONOSCOPY WITH PROPOFOL N/A 06/02/2022    Procedure: COLONOSCOPY WITH PROPOFOL;  Surgeon: Dolores Frame, MD;  Location: [facility];  Service:  Gastroenterology;  Laterality: N/A;  730 ASA 2   ESOPHAGOGASTRODUODENOSCOPY N/A 11/15/2015    Procedure: ESOPHAGOGASTRODUODENOSCOPY (EGD);  Surgeon: Malissa Hippo, MD;  Location: [facility];  Service: Endoscopy;  Laterality: N/A;  1:10 - moved to 12:55 - Ann notified pt   MELANOMA EXCISION   07/2021    back   POLYPECTOMY   01/09/2019    Procedure: POLYPECTOMY;  Surgeon: Malissa Hippo, MD;  Location: [facility];  Service: Endoscopy;;  colon   POLYPECTOMY   06/02/2022    Procedure: POLYPECTOMY;  Surgeon: Marguerita Merles, Reuel Boom, MD;  Location: [facility];  Service: Gastroenterology;;   TONSILLECTOMY             Family History  Problem Relation Age of Onset   Heart disease Mother     Healthy Sister      Social History         Tobacco Use   Smoking status: Former      Types: Cigarettes, Cigars      Quit date: 01/25/2009      Years since quitting: 14.3      Passive exposure: Past   Smokeless  tobacco: Never  Vaping Use   Vaping Use: Never used  Substance Use Topics   Alcohol use: No   Drug use: No        Current Outpatient Medications  Medication Instructions   ALPRAZolam (XANAX) 0.25 mg, Oral, Daily at bedtime   Ascorbic Acid (VITAMIN C PO) 1 tablet, Oral, Daily   aspirin EC 81 mg, Oral, Daily   Ativan 0.25 mg, Oral, Every 30 min PRN, 1st dose 30 minutes prior to MRI, repeat if needed.   ibuprofen (ADVIL) 800 MG tablet TAKE (1) TABLET BY MOUTH EVERY EIGHT HOURS AS NEEDED.   losartan-hydrochlorothiazide (HYZAAR) 100-12.5 MG tablet 1 tablet, Oral, Daily   Multiple Vitamin (MULTIVITAMIN WITH MINERALS) TABS tablet 1 tablet, Oral, Daily   Myrbetriq 50 mg, Oral, Daily   nebivolol (BYSTOLIC) 5 mg, Oral, Daily   nitroGLYCERIN (NITROSTAT) 0.4 mg, Sublingual, Every 5 min PRN, For chest pain   rosuvastatin (CRESTOR) 5 MG tablet TAKE 1 TABLET BY MOUTH DAILY AT 6PM FOR CHOLESTEROL.      Physical Exam Vitals and nursing note reviewed.  Constitutional:      Appearance: Normal appearance.  HENT:     Head: Normocephalic and atraumatic.  Eyes:     General: No scleral icterus.       Right eye: No discharge.        Left eye: No discharge.     Extraocular Movements: Extraocular movements intact.     Conjunctiva/sclera: Conjunctivae normal.     Pupils: Pupils are equal, round, and reactive to light.  Cardiovascular:     Rate and Rhythm: Normal rate.     Pulses: Normal pulses.  Skin:    General: Skin is warm and dry.     Capillary Refill: Capillary refill takes less than 2 seconds.  Neurological:     General: No focal deficit present.     Mental Status: He is alert and oriented to person, place, and time.  Psychiatric:        Mood and Affect: Mood normal.        Behavior: Behavior normal.        Thought Content: Thought content normal.        Judgment: Judgment normal.      Right knee medial tenderness with effusion skin is normal Limited range of  motion secondary to pain  flexion is past 90 degrees No instability detected muscle tone normal   Imaging    Plain films arthritis   MRI torn medial meniscus    IMPRESSION: Tricompartment osteoarthritis, severe in the medial compartment with full-thickness cartilage loss along the weight-bearing surface. Extensive underlying subchondral marrow edema in medial femoral condyle and tibial plateau with probable associated small subchondral insufficiency fractures.   Complex degenerative tearing of the medial meniscal body with substance loss and nondisplaced horizontal tear of the posterior horn.   Free edge and undersurface fraying of the posterior horn lateral meniscus near the horn-body junction.   Moderate-large size joint effusion.   Prepatellar bursitis with bursal distension measuring up to 2.4 x 0.4 x 2.4 cm.     Electronically Signed   By: Caprice Renshaw M.D.   On: 05/19/2023 15:28

## 2023-06-21 ENCOUNTER — Encounter (HOSPITAL_COMMUNITY): Payer: Self-pay

## 2023-06-21 ENCOUNTER — Encounter (HOSPITAL_COMMUNITY)
Admission: RE | Admit: 2023-06-21 | Discharge: 2023-06-21 | Disposition: A | Discharge status: Home / Self Care | Payer: Medicare Other | Source: Ambulatory Visit | Attending: Orthopedic Surgery | Admitting: Orthopedic Surgery

## 2023-06-21 DIAGNOSIS — M23321 Other meniscus derangements, posterior horn of medial meniscus, right knee: Secondary | ICD-10-CM | POA: Insufficient documentation

## 2023-06-21 DIAGNOSIS — Z01818 Encounter for other preprocedural examination: Secondary | ICD-10-CM

## 2023-06-21 DIAGNOSIS — Z01812 Encounter for preprocedural laboratory examination: Secondary | ICD-10-CM | POA: Insufficient documentation

## 2023-06-21 HISTORY — DX: Fatty (change of) liver, not elsewhere classified: K76.0

## 2023-06-21 LAB — CBC WITH DIFFERENTIAL/PLATELET
Abs Immature Granulocytes: 0.04 10*3/uL (ref 0.00–0.07)
Basophils Absolute: 0.1 10*3/uL (ref 0.0–0.1)
Basophils Relative: 1 %
Eosinophils Absolute: 0.1 10*3/uL (ref 0.0–0.5)
Eosinophils Relative: 1 %
HCT: 44.8 % (ref 39.0–52.0)
Hemoglobin: 15.1 g/dL (ref 13.0–17.0)
Immature Granulocytes: 0 %
Lymphocytes Relative: 21 %
Lymphs Abs: 2.2 10*3/uL (ref 0.7–4.0)
MCH: 29.5 pg (ref 26.0–34.0)
MCHC: 33.7 g/dL (ref 30.0–36.0)
MCV: 87.7 fL (ref 80.0–100.0)
Monocytes Absolute: 0.7 10*3/uL (ref 0.1–1.0)
Monocytes Relative: 7 %
Neutro Abs: 7.5 10*3/uL (ref 1.7–7.7)
Neutrophils Relative %: 70 %
Platelets: 293 10*3/uL (ref 150–400)
RBC: 5.11 MIL/uL (ref 4.22–5.81)
RDW: 13.4 % (ref 11.5–15.5)
WBC: 10.6 10*3/uL — ABNORMAL HIGH (ref 4.0–10.5)
nRBC: 0 % (ref 0.0–0.2)

## 2023-06-21 LAB — BASIC METABOLIC PANEL
Anion gap: 10 (ref 5–15)
BUN: 21 mg/dL (ref 8–23)
CO2: 22 mmol/L (ref 22–32)
Calcium: 8.9 mg/dL (ref 8.9–10.3)
Chloride: 106 mmol/L (ref 98–111)
Creatinine, Ser: 0.9 mg/dL (ref 0.61–1.24)
GFR, Estimated: >60 mL/min (ref >60)
Glucose, Bld: 88 mg/dL (ref 70–99)
Potassium: 3.3 mmol/L — ABNORMAL LOW (ref 3.5–5.1)
Sodium: 138 mmol/L (ref 135–145)

## 2023-06-22 ENCOUNTER — Other Ambulatory Visit: Payer: Self-pay

## 2023-06-22 ENCOUNTER — Ambulatory Visit (HOSPITAL_BASED_OUTPATIENT_CLINIC_OR_DEPARTMENT_OTHER): Payer: Medicare Other | Admitting: Anesthesiology

## 2023-06-22 ENCOUNTER — Encounter (HOSPITAL_COMMUNITY)
Admission: RE | Disposition: A | Discharge status: Home / Self Care | Payer: Self-pay | Source: Ambulatory Visit | Attending: Orthopedic Surgery

## 2023-06-22 ENCOUNTER — Ambulatory Visit (HOSPITAL_COMMUNITY)
Admission: RE | Admit: 2023-06-22 | Discharge: 2023-06-22 | Disposition: A | Discharge status: Home / Self Care | Payer: Medicare Other | Source: Ambulatory Visit | Attending: Orthopedic Surgery | Admitting: Orthopedic Surgery

## 2023-06-22 ENCOUNTER — Ambulatory Visit (HOSPITAL_COMMUNITY): Payer: Medicare Other | Admitting: Anesthesiology

## 2023-06-22 DIAGNOSIS — M23300 Other meniscus derangements, unspecified lateral meniscus, right knee: Secondary | ICD-10-CM

## 2023-06-22 DIAGNOSIS — S83231A Complex tear of medial meniscus, current injury, right knee, initial encounter: Secondary | ICD-10-CM | POA: Diagnosis not present

## 2023-06-22 DIAGNOSIS — M23321 Other meniscus derangements, posterior horn of medial meniscus, right knee: Secondary | ICD-10-CM | POA: Diagnosis not present

## 2023-06-22 DIAGNOSIS — I10 Essential (primary) hypertension: Secondary | ICD-10-CM

## 2023-06-22 DIAGNOSIS — M23221 Derangement of posterior horn of medial meniscus due to old tear or injury, right knee: Secondary | ICD-10-CM | POA: Insufficient documentation

## 2023-06-22 DIAGNOSIS — I2510 Atherosclerotic heart disease of native coronary artery without angina pectoris: Secondary | ICD-10-CM | POA: Insufficient documentation

## 2023-06-22 DIAGNOSIS — I252 Old myocardial infarction: Secondary | ICD-10-CM | POA: Diagnosis not present

## 2023-06-22 DIAGNOSIS — Z955 Presence of coronary angioplasty implant and graft: Secondary | ICD-10-CM | POA: Diagnosis not present

## 2023-06-22 DIAGNOSIS — G4730 Sleep apnea, unspecified: Secondary | ICD-10-CM | POA: Insufficient documentation

## 2023-06-22 DIAGNOSIS — M23251 Derangement of posterior horn of lateral meniscus due to old tear or injury, right knee: Secondary | ICD-10-CM | POA: Insufficient documentation

## 2023-06-22 DIAGNOSIS — Z87891 Personal history of nicotine dependence: Secondary | ICD-10-CM | POA: Diagnosis not present

## 2023-06-22 DIAGNOSIS — M1711 Unilateral primary osteoarthritis, right knee: Secondary | ICD-10-CM

## 2023-06-22 DIAGNOSIS — M94261 Chondromalacia, right knee: Secondary | ICD-10-CM | POA: Insufficient documentation

## 2023-06-22 DIAGNOSIS — S83281A Other tear of lateral meniscus, current injury, right knee, initial encounter: Secondary | ICD-10-CM | POA: Diagnosis not present

## 2023-06-22 DIAGNOSIS — S83241A Other tear of medial meniscus, current injury, right knee, initial encounter: Secondary | ICD-10-CM

## 2023-06-22 HISTORY — PX: KNEE ARTHROSCOPY WITH MEDIAL MENISECTOMY: SHX5651

## 2023-06-22 SURGERY — ARTHROSCOPY, KNEE, WITH MEDIAL MENISCECTOMY
Anesthesia: General | Site: Knee | Laterality: Right

## 2023-06-22 MED ORDER — BUPIVACAINE HCL (PF) 0.5 % IJ SOLN
INTRAMUSCULAR | Status: DC | PRN
  Administered 2023-06-22: 30 mL

## 2023-06-22 MED ORDER — FENTANYL CITRATE (PF) 100 MCG/2ML IJ SOLN
INTRAMUSCULAR | Status: DC | PRN
  Administered 2023-06-22: 25 ug via INTRAVENOUS
  Administered 2023-06-22: 50 ug via INTRAVENOUS

## 2023-06-22 MED ORDER — BUPIVACAINE HCL (PF) 0.5 % IJ SOLN
INTRAMUSCULAR | Status: AC
  Filled 2023-06-22: qty 60

## 2023-06-22 MED ORDER — EPHEDRINE SULFATE (PRESSORS) 50 MG/ML IJ SOLN
INTRAMUSCULAR | Status: DC | PRN
  Administered 2023-06-22: 7.5 mg via INTRAVENOUS

## 2023-06-22 MED ORDER — ORAL CARE MOUTH RINSE: 15.0000 mL | Freq: Once | OROMUCOSAL | Status: DC

## 2023-06-22 MED ORDER — LIDOCAINE HCL (CARDIAC) PF 100 MG/5ML IV SOSY
PREFILLED_SYRINGE | INTRAVENOUS | Status: DC | PRN
  Administered 2023-06-22: 60 mg via INTRAVENOUS

## 2023-06-22 MED ORDER — ONDANSETRON HCL 4 MG/2ML IJ SOLN: 4.0000 mg | Freq: Once | INTRAMUSCULAR | Status: DC | PRN

## 2023-06-22 MED ORDER — HYDROCODONE-ACETAMINOPHEN 5-325 MG PO TABS: 1.0000 | ORAL_TABLET | ORAL | Status: DC | PRN

## 2023-06-22 MED ORDER — PROPOFOL 10 MG/ML IV BOLUS
INTRAVENOUS | Status: DC | PRN
  Administered 2023-06-22: 30 mg via INTRAVENOUS
  Administered 2023-06-22: 200 mg via INTRAVENOUS

## 2023-06-22 MED ORDER — CEFAZOLIN IN SODIUM CHLORIDE 3-0.9 GM/100ML-% IV SOLN
INTRAVENOUS | Status: AC
  Filled 2023-06-22: qty 100

## 2023-06-22 MED ORDER — EPINEPHRINE PF 1 MG/ML IJ SOLN
INTRAMUSCULAR | Status: AC
  Filled 2023-06-22: qty 5

## 2023-06-22 MED ORDER — SODIUM CHLORIDE 0.9 % IR SOLN
Status: DC | PRN
  Administered 2023-06-22 (×3): 3000 mL

## 2023-06-22 MED ORDER — LACTATED RINGERS IV SOLN: INTRAVENOUS | Status: DC

## 2023-06-22 MED ORDER — SODIUM CHLORIDE 0.9 % IR SOLN
Status: DC | PRN
  Administered 2023-06-22: 1000 mL

## 2023-06-22 MED ORDER — CHLORHEXIDINE GLUCONATE 0.12 % MT SOLN: 15.0000 mL | Freq: Once | OROMUCOSAL | Status: DC

## 2023-06-22 MED ORDER — IBUPROFEN 800 MG PO TABS: 800.0000 mg | ORAL_TABLET | Freq: Once | ORAL | Status: DC

## 2023-06-22 MED ORDER — FENTANYL CITRATE PF 50 MCG/ML IJ SOSY: 25.0000 ug | PREFILLED_SYRINGE | INTRAMUSCULAR | Status: DC | PRN

## 2023-06-22 MED ORDER — EPHEDRINE 5 MG/ML INJ
INTRAVENOUS | Status: AC
  Filled 2023-06-22: qty 5

## 2023-06-22 MED ORDER — PROPOFOL 10 MG/ML IV BOLUS
INTRAVENOUS | Status: AC
  Filled 2023-06-22: qty 20

## 2023-06-22 MED ORDER — FENTANYL CITRATE (PF) 100 MCG/2ML IJ SOLN
INTRAMUSCULAR | Status: AC
  Filled 2023-06-22: qty 2

## 2023-06-22 MED ORDER — CEFAZOLIN IN SODIUM CHLORIDE 3-0.9 GM/100ML-% IV SOLN: 3.0000 g | INTRAVENOUS | Status: DC

## 2023-06-22 MED ORDER — DEXTROSE 5 % IV SOLN
INTRAVENOUS | Status: DC | PRN
  Administered 2023-06-22: 3 g via INTRAVENOUS

## 2023-06-22 MED ORDER — OXYCODONE HCL 5 MG/5ML PO SOLN: 5.0000 mg | Freq: Once | ORAL | Status: DC | PRN

## 2023-06-22 MED ORDER — ONDANSETRON HCL 4 MG/2ML IJ SOLN
INTRAMUSCULAR | Status: AC
  Filled 2023-06-22: qty 2

## 2023-06-22 MED ORDER — ONDANSETRON HCL 4 MG/2ML IJ SOLN: 4.0000 mg | Freq: Once | INTRAMUSCULAR | Status: DC

## 2023-06-22 MED ORDER — OXYCODONE HCL 5 MG PO TABS: 5.0000 mg | ORAL_TABLET | Freq: Once | ORAL | Status: DC | PRN

## 2023-06-22 MED ORDER — HYDROCODONE-ACETAMINOPHEN 10-325 MG PO TABS: 1.0000 | ORAL_TABLET | ORAL | 0 refills | Status: DC | PRN

## 2023-06-22 SURGICAL SUPPLY — 41 items
APL PRP STRL LF DISP 70% ISPRP (MISCELLANEOUS) ×1
BAG HAMPER (MISCELLANEOUS) ×2 IMPLANT
BLADE SHAVER TORPEDO 4X13 (MISCELLANEOUS) IMPLANT
BNDG CMPR STD VLCR NS LF 5.8X6 (GAUZE/BANDAGES/DRESSINGS) ×1
BNDG ELASTIC 6X5.8 VLCR NS LF (GAUZE/BANDAGES/DRESSINGS) ×2 IMPLANT
CHLORAPREP W/TINT 26 (MISCELLANEOUS) ×2 IMPLANT
CLOTH BEACON ORANGE TIMEOUT ST (SAFETY) ×2 IMPLANT
COOLER ICEMAN CLASSIC (MISCELLANEOUS) ×2 IMPLANT
COVER LIGHT HANDLE STERIS (MISCELLANEOUS) ×4 IMPLANT
CUFF TOURN SGL QUICK 34 (TOURNIQUET CUFF) ×1
CUFF TRNQT CYL 34X4.125X (TOURNIQUET CUFF) IMPLANT
GAUZE SPONGE 4X4 12PLY STRL (GAUZE/BANDAGES/DRESSINGS) ×2 IMPLANT
GAUZE XEROFORM 5X9 LF (GAUZE/BANDAGES/DRESSINGS) ×2 IMPLANT
GLOVE BIOGEL PI IND STRL 7.0 (GLOVE) ×4 IMPLANT
GLOVE SS N UNI LF 8.5 STRL (GLOVE) ×2 IMPLANT
GOWN STRL REUS W/TWL LRG LVL3 (GOWN DISPOSABLE) ×2 IMPLANT
GOWN STRL REUS W/TWL XL LVL3 (GOWN DISPOSABLE) ×2 IMPLANT
IMMOBILIZER KNEE 19 UNV (ORTHOPEDIC SUPPLIES) IMPLANT
IV NS IRRIG 3000ML ARTHROMATIC (IV SOLUTION) ×4 IMPLANT
KIT BLADEGUARD II DBL (SET/KITS/TRAYS/PACK) ×2 IMPLANT
KIT TURNOVER CYSTO (KITS) ×2 IMPLANT
MANIFOLD NEPTUNE II (INSTRUMENTS) ×2 IMPLANT
MARKER SKIN DUAL TIP RULER LAB (MISCELLANEOUS) ×2 IMPLANT
NDL HYPO 18GX1.5 BLUNT FILL (NEEDLE) ×2 IMPLANT
NDL HYPO 21X1.5 SAFETY (NEEDLE) ×2 IMPLANT
NEEDLE HYPO 18GX1.5 BLUNT FILL (NEEDLE) ×1 IMPLANT
NEEDLE HYPO 21X1.5 SAFETY (NEEDLE) ×1 IMPLANT
NS IRRIG 1000ML POUR BTL (IV SOLUTION) ×2 IMPLANT
PACK ARTHROSCOPY WL (CUSTOM PROCEDURE TRAY) ×2 IMPLANT
PAD ABD 5X9 TENDERSORB (GAUZE/BANDAGES/DRESSINGS) ×2 IMPLANT
PAD ARMBOARD 7.5X6 YLW CONV (MISCELLANEOUS) ×2 IMPLANT
PAD COLD SHLDR SM WRAP-ON (PAD) IMPLANT
PAD FOR LEG HOLDER (MISCELLANEOUS) ×2 IMPLANT
PADDING CAST COTTON 6X4 STRL (CAST SUPPLIES) ×2 IMPLANT
RESECTOR TORPEDO 4MM 13CM CVD (MISCELLANEOUS) IMPLANT
SET ARTHROSCOPY INST (INSTRUMENTS) ×2 IMPLANT
SET BASIN LINEN APH (SET/KITS/TRAYS/PACK) ×2 IMPLANT
SUT ETHILON 3 0 FSL (SUTURE) ×2 IMPLANT
SYR 10ML LL (SYRINGE) ×2 IMPLANT
SYR 30ML LL (SYRINGE) ×4 IMPLANT
TUBING IN/OUT FLOW W/MAIN PUMP (TUBING) ×2 IMPLANT

## 2023-06-22 NOTE — Interval H&P Note (Signed)
History and Physical Interval Note:  06/22/2023 10:31 AM  Antonio Colon  has presented today for surgery, with the diagnosis of Right knee arthroscopy medial meniscectomy.  The various methods of treatment have been discussed with the patient and family. After consideration of risks, benefits and other options for treatment, the patient has consented to  Procedure(s): KNEE ARTHROSCOPY WITH MEDIAL MENISCECTOMY (Right) as a surgical intervention.  The patient's history has been reviewed, patient examined, no change in status, stable for surgery.  I have reviewed the patient's chart and labs.  Questions were answered to the patient's satisfaction.     Fuller Canada

## 2023-06-22 NOTE — Interval H&P Note (Signed)
History and Physical Interval Note:  06/22/2023 10:34 AM  Antonio Colon  has presented today for surgery, with the diagnosis of Right knee arthroscopy medial meniscectomy.  The various methods of treatment have been discussed with the patient and family. After consideration of risks, benefits and other options for treatment, the patient has consented to  Procedure(s): KNEE ARTHROSCOPY WITH MEDIAL MENISCECTOMY (Right) as a surgical intervention.  The patient's history has been reviewed, patient examined, no change in status, stable for surgery.  I have reviewed the patient's chart and labs.  Questions were answered to the patient's satisfaction.     Fuller Canada

## 2023-06-22 NOTE — Brief Op Note (Signed)
06/22/2023  11:40 AM  PATIENT:  Antonio Colon  72 y.o. male  PRE-OPERATIVE DIAGNOSIS:  Right knee arthroscopy medial meniscectomy  POST-OPERATIVE DIAGNOSIS:  Right knee arthroscopy medial and lateral meniscectomy  PROCEDURE:  Procedure(s): KNEE ARTHROSCOPY WITH MEDIAL AND LATERAL MENISCECTOMY (Right)  SURGEON:  Surgeon(s) and Role:    Vickki Hearing, MD - Primary  PHYSICIAN ASSISTANT:   ASSISTANTS: none   ANESTHESIA:   general  EBL:  5 mL   BLOOD ADMINISTERED:none  DRAINS: none   LOCAL MEDICATIONS USED:  MARCAINE     SPECIMEN:  No Specimen  DISPOSITION OF SPECIMEN:  N/A  COUNTS:  YES  TOURNIQUET:  * Missing tourniquet times found for documented tourniquets in log: [id number] *  DICTATION: .Dragon Dictation  PLAN OF CARE: Discharge to home after PACU  PATIENT DISPOSITION:  PACU - hemodynamically stable.   Delay start of Pharmacological VTE agent (>24hrs) due to surgical blood loss or risk of bleeding: not applicable

## 2023-06-22 NOTE — Anesthesia Procedure Notes (Signed)
Procedure Name: LMA Insertion Date/Time: 06/22/2023 10:50 AM  Performed by: Franco Nones, CRNAPre-anesthesia Checklist: Patient identified, Patient being monitored, Timeout performed, Emergency Drugs available and Suction available Patient Re-evaluated:Patient Re-evaluated prior to induction Oxygen Delivery Method: Circle System Utilized Preoxygenation: Pre-oxygenation with 100% oxygen Induction Type: IV induction Ventilation: Mask ventilation without difficulty LMA: LMA inserted LMA Size: 4.0 Number of attempts: 1 Placement Confirmation: positive ETCO2 and breath sounds checked- equal and bilateral Tube secured with: Tape Dental Injury: Teeth and Oropharynx as per pre-operative assessment

## 2023-06-22 NOTE — Anesthesia Preprocedure Evaluation (Signed)
Anesthesia Evaluation  Patient identified by MRN, date of birth, ID band Patient awake    Reviewed: Allergy & Precautions, H&P , NPO status , Patient's Chart, lab work & pertinent test results, reviewed documented beta blocker date and time   Airway Mallampati: II  TM Distance: >3 FB Neck ROM: full    Dental no notable dental hx.    Pulmonary neg pulmonary ROS, sleep apnea , pneumonia, former smoker   Pulmonary exam normal breath sounds clear to auscultation       Cardiovascular Exercise Tolerance: Good hypertension, + CAD and + Past MI  negative cardio ROS + Valvular Problems/Murmurs  Rhythm:regular Rate:Normal     Neuro/Psych  Neuromuscular disease negative neurological ROS  negative psych ROS   GI/Hepatic negative GI ROS, Neg liver ROS, hiatal hernia,GERD  ,,  Endo/Other  negative endocrine ROS    Renal/GU negative Renal ROS  negative genitourinary   Musculoskeletal   Abdominal   Peds  Hematology negative hematology ROS (+)   Anesthesia Other Findings   Reproductive/Obstetrics negative OB ROS                             Anesthesia Physical Anesthesia Plan  ASA: 3  Anesthesia Plan: General and General LMA   Post-op Pain Management:    Induction:   PONV Risk Score and Plan: Ondansetron  Airway Management Planned:   Additional Equipment:   Intra-op Plan:   Post-operative Plan:   Informed Consent: I have reviewed the patients History and Physical, chart, labs and discussed the procedure including the risks, benefits and alternatives for the proposed anesthesia with the patient or authorized representative who has indicated his/her understanding and acceptance.     Dental Advisory Given  Plan Discussed with: CRNA  Anesthesia Plan Comments:        Anesthesia Quick Evaluation

## 2023-06-22 NOTE — Op Note (Signed)
06/22/2023  11:41 AM  Knee arthroscopy dictation  Preop diagnosis medial meniscus tear osteoarthritis right knee  Postop diagnosis medial meniscus tear lateral meniscus tear osteoarthritis right knee  Procedure arthroscopy right knee with medial and lateral meniscectomy  Surgeon Romeo Apple  Operative findings  MEDIAL - meniscus complex tear posterior horn and body extending into the anterior horn tear, grade 4 arthritis -articular surface grade 4 arthritis tibial side grade 3 arthritis femoral side  LATERAL - meniscus free edge tear posterior horn and body lateral meniscus -articular surface some areas grade 4 changes on the femoral side grade II chondromalacia on the tibial side  PTF   -articular surface grade 2 and 3 arthritis  NOTCH  -acl synovitis but ACL intact -pcl intact     The patient was identified in the preoperative holding area using 2 approved identification mechanisms. The chart was reviewed and updated. The surgical site was confirmed as right knee and marked with an indelible marker.  The patient was taken to the operating room for anesthesia. After successful general anesthesia, 3 g Ancef was used as IV antibiotics.  The patient was placed in the supine position with the (right) the operative extremity in an arthroscopic leg holder and the opposite extremity in a padded leg holder.  The timeout was executed.  A lateral portal was established with an 11 blade and the scope was introduced into the joint. A diagnostic arthroscopy was performed in circumferential manner examining the entire knee joint. A medial portal was established and the diagnostic arthroscopy was repeated using a probe to palpate intra-articular structures as they were encountered.    The medial meniscus was resected using a duckbill forceps. The meniscal fragments were removed with a motorized shaver. The meniscus was balanced  until a stable rim was obtained.  The lateral meniscus was  resected and the meniscal fragments were removed with a motorized shaver  The scope was placed into the medial joint and then the anterior horn of the medial meniscus of the torn portion was resected  The arthroscopic pump was placed on the wash mode and any excess debris was removed from the joint using suction.  60 cc of Marcaine with epinephrine was injected through the arthroscope.  The portals were closed with 3-0 nylon suture.  A sterile bandage, Ace wrap and Cryo/Cuff was placed and the Cryo/Cuff was activated. The patient was taken to the recovery room in stable condition.  PHYSICIAN ASSISTANT: no  ASSISTANTS: none   ANESTHESIA:   General  EBL:  none   BLOOD ADMINISTERED:none  DRAINS: none   LOCAL MEDICATIONS USED:  MARCAINE     SPECIMEN:  No Specimen  DISPOSITION OF SPECIMEN:  N/A  COUNTS:  YES   DICTATION: .Dragon Dictation  PLAN OF CARE: Discharge to home after PACU  PATIENT DISPOSITION:  PACU - hemodynamically stable.   Delay start of Pharmacological VTE agent (>24hrs) due to surgical blood loss or risk of bleeding: not applicable  POST OP PLAN  WB as tolerated SUTURES OUT IN A WEEK  AROM  BRACE knee immobilizer to go home with and then back into the hinged knee brace for 6 weeks due to the bone contusions noted on MRI

## 2023-06-22 NOTE — Transfer of Care (Signed)
Immediate Anesthesia Transfer of Care Note  Patient: URIAS SHEEK  Procedure(s) Performed: KNEE ARTHROSCOPY WITH MEDIAL AND LATERAL MENISCECTOMY (Right: Knee)  Patient Location: PACU  Anesthesia Type:General  Level of Consciousness: awake and patient cooperative  Airway & Oxygen Therapy: Patient Spontanous Breathing and Patient connected to nasal cannula oxygen  Post-op Assessment: Report given to RN and Post -op Vital signs reviewed and stable  Post vital signs: Reviewed and stable  Last Vitals:  Vitals Value Taken Time  BP 147/71 1155  Temp 97.9 1155  Pulse 65 1155  Resp 22 1155  SpO2 93 1155    Last Pain:  Vitals:   06/22/23 0902  TempSrc: Oral  PainSc: 0-No pain      Patients Stated Pain Goal: 5 (06/22/23 0902)  Complications: No notable events documented.

## 2023-06-24 ENCOUNTER — Encounter (HOSPITAL_COMMUNITY): Payer: Self-pay | Admitting: Orthopedic Surgery

## 2023-06-24 NOTE — Anesthesia Postprocedure Evaluation (Signed)
Anesthesia Post Note  Patient: Antonio Colon  Procedure(s) Performed: KNEE ARTHROSCOPY WITH MEDIAL AND LATERAL MENISCECTOMY (Right: Knee)  Patient location during evaluation: Phase II Anesthesia Type: General Level of consciousness: awake Pain management: pain level controlled Vital Signs Assessment: post-procedure vital signs reviewed and stable Respiratory status: spontaneous breathing and respiratory function stable Cardiovascular status: blood pressure returned to baseline and stable Postop Assessment: no headache and no apparent nausea or vomiting Anesthetic complications: no Comments: Late entry   No notable events documented.   Last Vitals:  Vitals:   06/22/23 1230 06/22/23 1242  BP: (!) 150/63 (!) 172/82  Pulse: 61 61  Resp: 18   Temp:  36.4 C  SpO2: 97% 100%    Last Pain:  Vitals:   06/23/23 1402  TempSrc:   PainSc: 2                  Windell Norfolk

## 2023-06-25 ENCOUNTER — Ambulatory Visit (INDEPENDENT_AMBULATORY_CARE_PROVIDER_SITE_OTHER): Payer: Medicare Other | Admitting: Orthopedic Surgery

## 2023-06-25 ENCOUNTER — Encounter: Payer: Self-pay | Admitting: Orthopedic Surgery

## 2023-06-25 DIAGNOSIS — Z9889 Other specified postprocedural states: Secondary | ICD-10-CM

## 2023-06-25 NOTE — Progress Notes (Signed)
Chief Complaint  Patient presents with   Post-op Follow-up    Knee scope right 06/22/23    Encounter Diagnosis  Name Primary?   Status post arthroscopy of right knee June 22, 2023 Yes    Preop diagnosis medial meniscus tear osteoarthritis right knee  Postop diagnosis medial meniscus tear lateral meniscus tear osteoarthritis right knee  Procedure arthroscopy right knee with medial and lateral meniscectomy   Postop visit #1 status post arthroscopy right knee with partial medial meniscectomy, partial lateral meniscectomy  Significant arthritic changes were seen at the time of surgery  However the patient is doing very well he is happy with his knee he says his knee feels much better  He has 90 degrees of flexion.  His wounds look good he has a small effusion.  He is walking with a cane  I advised him to take it easy continue his bracing for the stress reaction of the bone  Follow-up in 3 weeks assess return to work

## 2023-06-28 ENCOUNTER — Encounter: Payer: Medicare Other | Admitting: Orthopedic Surgery

## 2023-07-02 ENCOUNTER — Ambulatory Visit (INDEPENDENT_AMBULATORY_CARE_PROVIDER_SITE_OTHER): Payer: Medicare Other | Admitting: Orthopedic Surgery

## 2023-07-02 ENCOUNTER — Encounter: Payer: Self-pay | Admitting: Orthopedic Surgery

## 2023-07-02 DIAGNOSIS — Z9889 Other specified postprocedural states: Secondary | ICD-10-CM

## 2023-07-02 DIAGNOSIS — T8149XA Infection following a procedure, other surgical site, initial encounter: Secondary | ICD-10-CM

## 2023-07-02 MED ORDER — DOXYCYCLINE HYCLATE 100 MG PO TABS
100.0000 mg | ORAL_TABLET | Freq: Two times a day (BID) | ORAL | 1 refills | Status: AC
Start: 2023-07-02 — End: 2023-07-16

## 2023-07-02 NOTE — Patient Instructions (Signed)
Wash area with soap and water   Apply neosporin  Take antibiotics for 14 days

## 2023-07-02 NOTE — Progress Notes (Signed)
Chief Complaint  Patient presents with   Post-op Follow-up    Right knee 06/22/23 has had some purulent drainage when squeezing portal site and has had some redness as well   72 year old male status post arthroscopy right knee presents with redness around his medial portal  Expressed some fluid out of it, does not seem like it was actual purulent  In any event I expressed some clear fluid out of it it was red around the edge  His instructions will be to start Neosporin wash with soap and water antibiotics for 14 days keep regular appointment  Encounter Diagnoses  Name Primary?   Cellulitis, wound, post-operative Yes   Status post arthroscopy of right knee June 22, 2023      Meds ordered this encounter  Medications   doxycycline (VIBRA-TABS) 100 MG tablet    Sig: Take 1 tablet (100 mg total) by mouth 2 (two) times daily for 14 days.    Dispense:  28 tablet    Refill:  1

## 2023-07-09 ENCOUNTER — Ambulatory Visit (INDEPENDENT_AMBULATORY_CARE_PROVIDER_SITE_OTHER): Payer: Medicare Other | Admitting: Orthopedic Surgery

## 2023-07-09 ENCOUNTER — Encounter: Payer: Medicare Other | Admitting: Orthopedic Surgery

## 2023-07-09 ENCOUNTER — Encounter: Payer: Self-pay | Admitting: Orthopedic Surgery

## 2023-07-09 VITALS — BP 138/61 | HR 74 | Ht 70.0 in | Wt 311.0 lb

## 2023-07-09 DIAGNOSIS — M8430XA Stress fracture, unspecified site, initial encounter for fracture: Secondary | ICD-10-CM

## 2023-07-09 DIAGNOSIS — Z9889 Other specified postprocedural states: Secondary | ICD-10-CM

## 2023-07-09 DIAGNOSIS — M25461 Effusion, right knee: Secondary | ICD-10-CM

## 2023-07-09 DIAGNOSIS — T8149XA Infection following a procedure, other surgical site, initial encounter: Secondary | ICD-10-CM

## 2023-07-09 DIAGNOSIS — M23321 Other meniscus derangements, posterior horn of medial meniscus, right knee: Secondary | ICD-10-CM

## 2023-07-09 DIAGNOSIS — M1712 Unilateral primary osteoarthritis, left knee: Secondary | ICD-10-CM

## 2023-07-09 NOTE — Progress Notes (Signed)
   This is a postop visit  Encounter Diagnoses  Name Primary?   Cellulitis, wound, post-operative Yes   Status post arthroscopy of right knee June 22, 2023    Stress reaction of bone medial femoral condyle    Effusion, right knee    Derangement of posterior horn of medial meniscus of right knee    Primary osteoarthritis of left knee     Chief Complaint  Patient presents with   Knee Pain    Follow up left knee doing better minor pain   Meds: doxycycline for post op cellulitis   The patient is status post  -Preop diagnosis medial meniscus tear osteoarthritis right knee  Postop diagnosis medial meniscus tear lateral meniscus tear osteoarthritis right knee  Procedure arthroscopy right knee with medial and lateral meniscectomy  This is postop day number 17  Subjective complaints  none   Physical exam findings  wound improved  Knee flexion improved   Gait improved    Assessment and plan  Doing well Cellulitis resolved  May return to work in brace    F/u 3 weeks

## 2023-07-20 ENCOUNTER — Other Ambulatory Visit: Payer: Self-pay | Admitting: Orthopedic Surgery

## 2023-07-20 DIAGNOSIS — Z9889 Other specified postprocedural states: Secondary | ICD-10-CM

## 2023-07-20 DIAGNOSIS — M1712 Unilateral primary osteoarthritis, left knee: Secondary | ICD-10-CM

## 2023-07-20 DIAGNOSIS — M8430XA Stress fracture, unspecified site, initial encounter for fracture: Secondary | ICD-10-CM

## 2023-07-23 ENCOUNTER — Other Ambulatory Visit: Payer: Self-pay | Admitting: Orthopedic Surgery

## 2023-07-23 DIAGNOSIS — M25461 Effusion, right knee: Secondary | ICD-10-CM

## 2023-07-29 ENCOUNTER — Encounter: Payer: Self-pay | Admitting: Orthopedic Surgery

## 2023-07-29 ENCOUNTER — Ambulatory Visit (INDEPENDENT_AMBULATORY_CARE_PROVIDER_SITE_OTHER): Payer: Medicare Other | Admitting: Orthopedic Surgery

## 2023-07-29 DIAGNOSIS — Z9889 Other specified postprocedural states: Secondary | ICD-10-CM

## 2023-07-29 NOTE — Progress Notes (Signed)
Chief Complaint  Patient presents with   Follow-up    Recheck on    Encounter Diagnosis  Name Primary?   Status post arthroscopy of right knee June 22, 2023 Yes    Antonio Colon is doing well after his knee arthroscopy he had some significant arthritis medial stress reaction effusion and torn medial meniscus etc. developed a postop cellulitis at one of his portals which was treated with p.o. antibiotics and resolved  He is back to work he is wearing an economy hinged knee brace  Occasionally he will take some medicine for pain that he is having over the medial femoral condyle  I will see him in about 3 months his goal weight is 280 pounds to have total knee replacement he is already lost 40 pounds since April with just diet      Encounter Diagnoses  Name Primary?   Cellulitis, wound, post-operative Yes   Status post arthroscopy of right knee June 22, 2023     Stress reaction of bone medial femoral condyle     Effusion, right knee     Derangement of posterior horn of medial meniscus of right knee     Primary osteoarthritis of left knee

## 2023-08-10 DIAGNOSIS — D225 Melanocytic nevi of trunk: Secondary | ICD-10-CM | POA: Diagnosis not present

## 2023-08-10 DIAGNOSIS — Z08 Encounter for follow-up examination after completed treatment for malignant neoplasm: Secondary | ICD-10-CM | POA: Diagnosis not present

## 2023-08-10 DIAGNOSIS — Z1283 Encounter for screening for malignant neoplasm of skin: Secondary | ICD-10-CM | POA: Diagnosis not present

## 2023-08-10 DIAGNOSIS — Z85820 Personal history of malignant melanoma of skin: Secondary | ICD-10-CM | POA: Diagnosis not present

## 2023-09-08 ENCOUNTER — Other Ambulatory Visit: Payer: Self-pay | Admitting: Orthopedic Surgery

## 2023-09-08 DIAGNOSIS — M8430XA Stress fracture, unspecified site, initial encounter for fracture: Secondary | ICD-10-CM

## 2023-09-08 DIAGNOSIS — M1712 Unilateral primary osteoarthritis, left knee: Secondary | ICD-10-CM

## 2023-09-08 DIAGNOSIS — Z9889 Other specified postprocedural states: Secondary | ICD-10-CM

## 2023-09-08 NOTE — Telephone Encounter (Signed)
Dr. Mort Sawyers pt - pt lvm requesting a refill on Hydrocodone 5-325, 30 tablets, Every 6 hours PRN for moderate pain to be sent to [facility].

## 2023-09-09 NOTE — Telephone Encounter (Signed)
Dr. Mort Sawyers pt - pt is calling again about his refill request.  He stated [facility] says they don't have anything and he'd like to know if it's going to be refilled.  [phone]

## 2023-10-08 ENCOUNTER — Other Ambulatory Visit: Payer: Self-pay | Admitting: Orthopedic Surgery

## 2023-10-08 DIAGNOSIS — M25461 Effusion, right knee: Secondary | ICD-10-CM

## 2023-10-08 MED ORDER — IBUPROFEN 800 MG PO TABS: 800.0000 mg | ORAL_TABLET | Freq: Three times a day (TID) | ORAL | 5 refills | Status: DC

## 2023-10-08 NOTE — Telephone Encounter (Signed)
Refill request received via fax for  Ibuprofen

## 2023-10-11 ENCOUNTER — Ambulatory Visit (INDEPENDENT_AMBULATORY_CARE_PROVIDER_SITE_OTHER): Payer: Medicare Other | Admitting: Gastroenterology

## 2023-10-11 VITALS — BP 119/69 | HR 72 | Temp 97.2°F | Ht 70.0 in | Wt 280.0 lb

## 2023-10-11 DIAGNOSIS — Z09 Encounter for follow-up examination after completed treatment for conditions other than malignant neoplasm: Secondary | ICD-10-CM | POA: Diagnosis not present

## 2023-10-11 DIAGNOSIS — Z8719 Personal history of other diseases of the digestive system: Secondary | ICD-10-CM | POA: Diagnosis not present

## 2023-10-11 DIAGNOSIS — K529 Noninfective gastroenteritis and colitis, unspecified: Secondary | ICD-10-CM

## 2023-10-11 NOTE — Patient Instructions (Signed)
Call back if presenting new issues with diarrhea Repeat colonoscopy in June 2026

## 2023-10-11 NOTE — Progress Notes (Signed)
Katrinka Blazing, M.D. Gastroenterology & Hepatology [facility] [address] [place], [place] [address]  Primary Care Physician: Benita Stabile, MD [address] [place] [address]  I will communicate my assessment and recommendations to the referring MD via EMR.  Problems: Acute diarrhea, possibly infectious, resolved Hepatic angioma   History of Present Illness: Antonio Colon is a 72 y.o. male with past medical history of coronary artery disease, GERD, hyperlipidemia, hypertension, history of MI, obesity, sleep apnea, hepatic hemangioma, who presents for follow up of diarrhea .  The patient was last seen on 10/05/2022. At that time, the patient was advised to take Benefiber to increase the bulk of stool and Bentyl as needed for diarrhea episodes.  Patient reports that his diarrhea has resolved. Has 1-2 Bms per day. No diarrhea. Does not take any antidiarrheals or fiber.  The patient denies having any nausea, vomiting, fever, chills, hematochezia, melena, hematemesis, abdominal distention, abdominal pain, jaundice, pruritus or weight loss.  Last Colonoscopy:06/02/22 - Three 3 to 5 mm polyps in the transverse colon and in the ascending colon, removed with a cold snare. Resected and retrieved. - One 2 mm polyp in the ascending colon, removed with a cold biopsy forceps. Resected and retrieved. - One 2 mm polyp in the rectum, removed with a cold biopsy forceps. Resected and retrieved. - Diverticulosis in the sigmoid colon. Normal colon biopsied. - Non-bleeding internal hemorrhoids.   A. COLON, RANDOM, BIOPSY:  -  Colonic mucosa within normal limits.   B. COLON, ASCENDING, POLYPECTOMY:  -  Fragments of low-grade dysplasia/tubular adenomatous epithelium.  -  Fragments of sessile serrated polyp (given the multiplicity of polyps  and fragmented nature of the specimen, it is not morphologically  possible to distinguish separate sessile  serrated polyps and tubular  adenomas from possible sessile serrated polyps with dysplasia).   C. RECTUM, POLYPECTOMY:  -  Hyperplastic polyp.     Advise repeat colonoscopy in 3 years.  Past Medical History: Past Medical History:  Diagnosis Date   CAD (coronary artery disease)    stents   Fatty liver    Fracture, rib 05/2021   right side   GERD (gastroesophageal reflux disease)    Heart disease    Heart murmur    childhood   Hiatal hernia    History of stress test 01/27/2012   Normal study. No significant ischemia demonstration, this low risk scan, no significant change compared to previous study.   Hypercholesteremia    Hyperlipidemia    Hypertension    Myocardial infarction (HCC) 2010   Obesity    Pneumonia 2019   Sleep apnea    cpap at night    Past Surgical History: Past Surgical History:  Procedure Laterality Date   AXILLARY LYMPH NODE DISSECTION Right 07/2021   BACK SURGERY  10/2019   Dr. Franky Macho: right L4-5 laminectomy   BIOPSY  06/02/2022   Procedure: BIOPSY;  Surgeon: Marguerita Merles, Reuel Boom, MD;  Location: [facility];  Service: Gastroenterology;;   CARDIAC CATHETERIZATION  10/2009   CAD stents to the LAD using an Endeavor drug eluting (3x18mm) by Dr Clarene Duke, he had normal circumflex and RCA at the time.   COLONOSCOPY  06/15/2012   COLONOSCOPY N/A 01/09/2019   Procedure: COLONOSCOPY;  Surgeon: Malissa Hippo, MD;  Location: [facility];  Service: Endoscopy;  Laterality: N/A;  2:10pm   COLONOSCOPY WITH PROPOFOL N/A 06/02/2022   Procedure: COLONOSCOPY WITH PROPOFOL;  Surgeon: Levon Hedger  Alisia Ferrari, MD;  Location: [facility];  Service: Gastroenterology;  Laterality: N/A;  730 ASA 2   ESOPHAGOGASTRODUODENOSCOPY N/A 11/15/2015   Procedure: ESOPHAGOGASTRODUODENOSCOPY (EGD);  Surgeon: Malissa Hippo, MD;  [facility]: [facility];  Service: Endoscopy;  Laterality: N/A;  1:10 - moved to 12:55 - Ann notified pt   KNEE ARTHROSCOPY WITH MEDIAL MENISECTOMY  Right 06/22/2023   Procedure: KNEE ARTHROSCOPY WITH MEDIAL AND LATERAL MENISCECTOMY;  Surgeon: Vickki Hearing, MD;  Location: [facility];  Service: Orthopedics;  Laterality: Right;   MELANOMA EXCISION  07/2021   back   POLYPECTOMY  01/09/2019   Procedure: POLYPECTOMY;  Surgeon: Malissa Hippo, MD;  [facility];  Service: Endoscopy;;  colon   POLYPECTOMY  06/02/2022   Procedure: POLYPECTOMY;  Surgeon: Marguerita Merles, Reuel Boom, MD;  [facility]: [facility];  Service: Gastroenterology;;   TONSILLECTOMY      Family History: Family History  Problem Relation Age of Onset   Heart disease Mother    Healthy Sister     Social History: Social History   Tobacco Use  Smoking Status Former   Current packs/day: 0.00   Types: Cigarettes, Cigars   Quit date: 01/25/2009   Years since quitting: 14.7   Passive exposure: Past  Smokeless Tobacco Never   Social History   Substance and Sexual Activity  Alcohol Use No   Social History   Substance and Sexual Activity  Drug Use No    Allergies: No Known Allergies  Medications: Current Outpatient Medications  Medication Sig Dispense Refill   ALPRAZolam (XANAX) 0.25 MG tablet Take 0.25 mg by mouth at bedtime.     Ascorbic Acid (VITAMIN C PO) Take 1 tablet by mouth daily.     aspirin EC 81 MG tablet Take 1 tablet (81 mg total) by mouth daily.     losartan-hydrochlorothiazide (HYZAAR) 100-12.5 MG tablet Take 1 tablet by mouth daily.     Multiple Vitamin (MULTIVITAMIN WITH MINERALS) TABS tablet Take 1 tablet by mouth daily.     MYRBETRIQ 50 MG TB24 tablet Take 50 mg by mouth daily.      nebivolol (BYSTOLIC) 5 MG tablet TAKE 1 TABLET BY MOUTH ONCE DAILY. 90 tablet 3   nitroGLYCERIN (NITROSTAT) 0.4 MG SL tablet Place 1 tablet (0.4 mg total) under the tongue every 5 (five) minutes as needed. For chest pain 25 tablet 11   rosuvastatin (CRESTOR) 5 MG tablet TAKE 1 TABLET BY MOUTH DAILY AT 6PM FOR CHOLESTEROL. 90 tablet 3   ATIVAN 0.5  MG tablet Take 0.5 tablets (0.25 mg total) by mouth every 30 (thirty) minutes as needed for anxiety. 1st dose 30 minutes prior to MRI, repeat if needed. (Patient not taking: Reported on 10/11/2023) 1 tablet 0   HYDROcodone-acetaminophen (NORCO/VICODIN) 5-325 MG tablet Take 1 tablet by mouth every 6 (six) hours as needed for moderate pain. (Patient not taking: Reported on 10/11/2023) 30 tablet 0   ibuprofen (ADVIL) 800 MG tablet Take 1 tablet (800 mg total) by mouth 3 (three) times daily. (Patient not taking: Reported on 10/11/2023) 90 tablet 5   No current facility-administered medications for this visit.    Review of Systems: GENERAL: negative for malaise, night sweats HEENT: No changes in hearing or vision, no nose bleeds or other nasal problems. NECK: Negative for lumps, goiter, pain and significant neck swelling RESPIRATORY: Negative for cough, wheezing CARDIOVASCULAR: Negative for chest pain, leg swelling, palpitations, orthopnea GI: SEE HPI MUSCULOSKELETAL: Negative for joint pain or swelling, back  pain, and muscle pain. SKIN: Negative for lesions, rash PSYCH: Negative for sleep disturbance, mood disorder and recent psychosocial stressors. HEMATOLOGY Negative for prolonged bleeding, bruising easily, and swollen nodes. ENDOCRINE: Negative for cold or heat intolerance, polyuria, polydipsia and goiter. NEURO: negative for tremor, gait imbalance, syncope and seizures. The remainder of the review of systems is noncontributory.   Physical Exam: BP 119/69 (BP Location: Left Arm, Patient Position: Sitting, Cuff Size: Large)   Pulse 72   Temp (!) 97.2 F (36.2 C) (Oral)   Ht 5\' 10"  (1.778 m)   Wt 280 lb (127 kg)   BMI 40.18 kg/m  GENERAL: The patient is AO x3, in no acute distress. HEENT: Head is normocephalic and atraumatic. EOMI are intact. Mouth is well hydrated and without lesions. NECK: Supple. No masses LUNGS: Clear to auscultation. No presence of rhonchi/wheezing/rales.  Adequate chest expansion HEART: RRR, normal s1 and s2. ABDOMEN: Soft, nontender, no guarding, no peritoneal signs, and nondistended. BS +. No masses. EXTREMITIES: Without any cyanosis, clubbing, rash, lesions or edema.  Has a brace on the right knee. NEUROLOGIC: AOx3, no focal motor deficit. SKIN: no jaundice, no rashes  Imaging/Labs: as above  I personally reviewed and interpreted the available labs, imaging and endoscopic files.  Impression and Plan: MOUSA PROUT is a 72 y.o. male with past medical history of coronary artery disease, GERD, hyperlipidemia, hypertension, history of MI, obesity, sleep apnea, hepatic hemangioma, who presents for follow up of diarrhea .  Patient had previous episodes of diarrhea which resolved with the use of fiber in diet.  These likely correspond to transient postinfectious diarrhea that has completely resolved.  He is not taking any medications for now.  Does not warrant any further evaluation as of now.  - Patient to call back if presenting new issues with diarrhea - Repeat colonoscopy in June 2026  All questions were answered.      Katrinka Blazing, MD Gastroenterology and Hepatology [facility]

## 2023-10-20 DIAGNOSIS — R7301 Impaired fasting glucose: Secondary | ICD-10-CM | POA: Diagnosis not present

## 2023-10-20 DIAGNOSIS — I2510 Atherosclerotic heart disease of native coronary artery without angina pectoris: Secondary | ICD-10-CM | POA: Diagnosis not present

## 2023-10-27 DIAGNOSIS — I10 Essential (primary) hypertension: Secondary | ICD-10-CM | POA: Diagnosis not present

## 2023-10-27 DIAGNOSIS — I700 Atherosclerosis of aorta: Secondary | ICD-10-CM | POA: Diagnosis not present

## 2023-10-27 DIAGNOSIS — K589 Irritable bowel syndrome without diarrhea: Secondary | ICD-10-CM | POA: Diagnosis not present

## 2023-10-27 DIAGNOSIS — R7301 Impaired fasting glucose: Secondary | ICD-10-CM | POA: Diagnosis not present

## 2023-10-27 DIAGNOSIS — N3281 Overactive bladder: Secondary | ICD-10-CM | POA: Diagnosis not present

## 2023-10-27 DIAGNOSIS — I2510 Atherosclerotic heart disease of native coronary artery without angina pectoris: Secondary | ICD-10-CM | POA: Diagnosis not present

## 2023-10-27 DIAGNOSIS — R911 Solitary pulmonary nodule: Secondary | ICD-10-CM | POA: Diagnosis not present

## 2023-10-27 DIAGNOSIS — M25561 Pain in right knee: Secondary | ICD-10-CM | POA: Diagnosis not present

## 2023-10-27 DIAGNOSIS — E785 Hyperlipidemia, unspecified: Secondary | ICD-10-CM | POA: Diagnosis not present

## 2023-10-27 DIAGNOSIS — I719 Aortic aneurysm of unspecified site, without rupture: Secondary | ICD-10-CM | POA: Diagnosis not present

## 2023-10-28 ENCOUNTER — Other Ambulatory Visit (INDEPENDENT_AMBULATORY_CARE_PROVIDER_SITE_OTHER): Payer: Medicare Other

## 2023-10-28 ENCOUNTER — Ambulatory Visit: Payer: Medicare Other | Admitting: Orthopedic Surgery

## 2023-10-28 ENCOUNTER — Encounter: Payer: Self-pay | Admitting: Orthopedic Surgery

## 2023-10-28 VITALS — BP 145/78 | HR 71 | Ht 70.0 in | Wt 274.0 lb

## 2023-10-28 DIAGNOSIS — M1711 Unilateral primary osteoarthritis, right knee: Secondary | ICD-10-CM | POA: Diagnosis not present

## 2023-10-28 DIAGNOSIS — Z9889 Other specified postprocedural states: Secondary | ICD-10-CM

## 2023-10-28 NOTE — Progress Notes (Signed)
Recheck  Chief Complaint  Patient presents with   Knee Pain    Wants to discus total knee replacement / wakes up at 3 am in pain right knee right hip lumbar spine all painful    Body mass index is 39.31 kg/m.    72 year old male had his knee scope at the time he was overweight and we could not replace his knee scope showed that he had grade 4 changes medially and laterally  He did well for couple of months but once he got back on his feet got back to doing his regular activities his knee pain is worsened  He did lose some more weight so he is ready for knee replacement  He stands and significant varus right knee small effusion Medial condyle femur tender lateral condyle not so much medial tibial plateau and joint line tender  Definite flexion contracture  Arc of motion is 10-120 degrees  X-ray today significantly worse x-ray than last year he is now 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 Gery Pray  Then we can proceed with surgical scheduling  He has no steps to get in the house Downstairs bathroom Expect him out of work 6 to 8 weeks  DVT discussed Infection discussed Flexion contracture discussed Postop stiffness discussed

## 2023-11-02 ENCOUNTER — Telehealth: Payer: Self-pay | Admitting: Orthopedic Surgery

## 2023-11-02 NOTE — Telephone Encounter (Signed)
Dr. Mort Sawyers pt - spoke w/the patient, he is wanting to know Dr. Romeo Apple has heard anything from the cardiologist, Dr. Allyson Sabal so he can move forward w/his surgery.  [phone]

## 2023-11-03 ENCOUNTER — Other Ambulatory Visit: Payer: Self-pay | Admitting: Orthopedic Surgery

## 2023-11-03 DIAGNOSIS — Z01818 Encounter for other preprocedural examination: Secondary | ICD-10-CM

## 2023-11-03 DIAGNOSIS — M1711 Unilateral primary osteoarthritis, right knee: Secondary | ICD-10-CM

## 2023-11-03 MED ORDER — BUPIVACAINE-MELOXICAM ER 400-12 MG/14ML IJ SOLN: 400.0000 mg | Freq: Once | INTRAMUSCULAR | Status: AC

## 2023-11-03 NOTE — Telephone Encounter (Signed)
Thanks, I called him to let him know and get him scheduled in December Left message for him to call back

## 2023-11-03 NOTE — Telephone Encounter (Signed)
I called him scheduled for Dec 10th  I can not see clearance in Epic Can you forward that to me? So I can forward to Anesthesia, please

## 2023-11-03 NOTE — Telephone Encounter (Signed)
Antonio Colon, patient called you back and I tried to call you but no answer. He is asking for you to give him a call.

## 2023-11-04 NOTE — Telephone Encounter (Signed)
No formal letter   None needed

## 2023-11-10 DIAGNOSIS — Z23 Encounter for immunization: Secondary | ICD-10-CM | POA: Diagnosis not present

## 2023-11-23 DIAGNOSIS — C4359 Malignant melanoma of other part of trunk: Secondary | ICD-10-CM | POA: Diagnosis not present

## 2023-11-23 DIAGNOSIS — C439 Malignant melanoma of skin, unspecified: Secondary | ICD-10-CM | POA: Diagnosis not present

## 2023-11-24 NOTE — Patient Instructions (Signed)
BECKETT STALLMAN  11/24/2023     @[email or web address] @   Your procedure is scheduled on  12/07/2023.   Report to [facility] at  0600  A.M.   Call this number if you have problems the morning of surgery:  [phone]  If you experience any cold or flu symptoms such as cough, fever, chills, shortness of breath, etc. between now and your scheduled surgery, please notify us at the above number.   Remember:  Do not after midnight.   You may drink clear liquids until 0330 am on 12/07/2023.    Clear liquids allowed are:                    Water, Juice (No red color; non-citric and without pulp; diabetics please choose diet or no sugar options), Carbonated beverages (diabetics please choose diet or no sugar options), Clear Tea (No creamer, milk, or cream, including half & half and powdered creamer), Black Coffee Only (No creamer, milk or cream, including half & half and powdered creamer), and Clear Sports drink (No red color; diabetics please choose diet or no sugar options)    Take these medicines the morning of surgery with A SIP OF WATER                                        nebivolol.    Do not wear jewelry, make-up or nail polish, including gel polish,  artificial nails, or any other type of covering on natural nails (fingers and  toes).  Do not wear lotions, powders, or perfumes, or deodorant.  Do not shave 48 hours prior to surgery.  Men may shave face and neck.  Do not bring valuables to the hospital.  [facility] is not responsible for any belongings or valuables.  Contacts, dentures or bridgework may not be worn into surgery.  Leave your suitcase in the car.  After surgery it may be brought to your room.  For patients admitted to the hospital, discharge time will be determined by your treatment team.  Patients discharged the day of surgery will not be allowed to drive home and must have someone with them for 24 hours.    Special instructions:   DO NOT smoke tobacco or  vape for 24 hours before your procedure.  Please read over the following fact sheets that you were given. Pain Booklet, Coughing and Deep Breathing, Blood Transfusion Information, Total Joint Packet, MRSA Information, Surgical Site Infection Prevention, Anesthesia Post-op Instructions, and Care and Recovery After Surgery      Total Knee Replacement, Care After This sheet gives you information about how to care for yourself after your procedure. Your health care provider may also give you more specific instructions. If you have problems or questions, contact your health care provider. What can I expect after the procedure? After the procedure, it is common to have: Redness, pain, and swelling at the incision area. Stiffness. Discomfort. A small amount of blood or clear fluid coming from your incision. Follow these instructions at home: Medicines Take over-the-counter and prescription medicines only as told by your health care provider. If you were prescribed a blood thinner (anticoagulant), take it as told by your health care provider. Ask your health care provider if the medicine prescribed to you: Requires you to avoid driving or using machinery. Can cause constipation. You may need to take these  actions to prevent or treat constipation: Drink enough fluid to keep your urine pale yellow. Take over-the-counter or prescription medicines. Eat foods that are high in fiber, such as beans, whole grains, and fresh fruits and vegetables. Limit foods that are high in fat and processed sugars, such as fried or sweet foods. Incision care  Follow instructions from your health care provider about how to take care of your incision. Make sure you: Wash your hands with soap and water for at least 20 seconds before and after you change your bandage (dressing). If soap and water are not available, use hand sanitizer. Change your dressing as told by your health care provider. Leave stitches (sutures),  staples, skin glue, or adhesive strips in place. These skin closures may need to stay in place for 2 weeks or longer. If adhesive strip edges start to loosen and curl up, you may trim the loose edges. Do not remove adhesive strips completely unless your health care provider tells you to do that. Do not take baths, swim, or use a hot tub until your health care provider approves. Check your incision area every day for signs of infection. Check for: More redness, swelling, or pain. More fluid or blood. Warmth. Pus or a bad smell. Activity Rest as told by your health care provider. Avoid sitting for a long time without moving. Get up to take short walks every 1-2 hours. This is important to improve blood flow and breathing. Ask for help if you feel weak or unsteady. Follow instructions from your health care provider about using a walker, crutches, or a cane. You may use your legs to support (bear) your body weight as told by your health care provider. Follow instructions about how much weight you may safely support on your affected leg (weight-bearing restrictions). A physical therapist may show you how to get out of a bed and chair and how to go up and down stairs. You will first do this with a walker, crutches, or a cane and then without any of these devices. Once you are able to walk without a limp, you may stop using a walker, crutches, or a cane. Do exercises as told by your health care provider or physical therapist. Avoid high-impact activities, including running, jumping rope, and doing jumping jacks. Do not play contact sports until your health care provider approves. Return to your normal activities as told by your health care provider. Ask your health care provider what activities are safe for you. Managing pain, stiffness, and swelling  If directed, put ice on your knee. To do this: Put ice in a plastic bag or use the icing device (cold flow pad) that you were given. Follow instructions  from your health care provider about how to use the icing device. Place a towel between your skin and the bag or between your skin and the icing device. Leave the ice on for 20 minutes, 2-3 times a day. Remove the ice if your skin turns bright red. This is very important. If you cannot feel pain, heat, or cold, you have a greater risk of damage to the area. Move your toes often to reduce stiffness and swelling. Raise (elevate) your leg above the level of your heart while you are sitting or lying down. Use several pillows to keep your leg straight. Do not put a pillow just under the knee. If the knee is bent for a long time, this may lead to stiffness. Wear elastic knee support as told by your health  care provider. Safety  To help prevent falls, keep floors clear of objects you may trip over. Place items that you may need within easy reach. Wear an apron or tool belt with pockets for carrying objects. This leaves your hands free to help with your balance. Ask your health care provider when it is safe to drive. General instructions Wear compression stockings as told by your health care provider. These stockings help to prevent blood clots and reduce swelling in your legs. Continue with breathing exercises. This helps prevent lung infection. Do not use any products that contain nicotine or tobacco. These products include cigarettes, chewing tobacco, and vaping devices, such as e-cigarettes. These can delay healing after surgery. If you need help quitting, ask your health care provider. Tell your health care provider if you plan to have dental work. Also: Tell your dentist about your joint replacement. Ask your health care provider if there are any special instructions you need to follow before having dental care and routine cleanings. Keep all follow-up visits. This is important. Contact a health care provider if: You have a fever or chills. You have a cough or feel short of breath. Your  medicine is not controlling your pain. You have any of these signs of infection: More redness, swelling, or pain around your incision. More fluid or blood coming from your incision. Warmth coming from your incision. Pus or a bad smell coming from your incision. You fall. Get help right away if: You have severe pain. You have trouble breathing. You have chest pain. You have redness, swelling, pain, or warmth in your calf or leg. Your incision breaks open after sutures or staples are removed. These symptoms may represent a serious problem that is an emergency. Do not wait to see if the symptoms will go away. Get medical help right away. Call your local emergency services (911 in the [place] Do not drive yourself to the hospital. Summary After the procedure, it is common to have pain and swelling at the incision area, a small amount of blood or fluid coming from your incision, and stiffness. Follow instructions from your health care provider about how to take care of your incision. Use crutches, a walker, or a cane as told by your health care provider. This information is not intended to replace advice given to you by your health care provider. Make sure you discuss any questions you have with your health care provider. Document Revised: 06/03/2020 Document Reviewed: 06/04/2020 Elsevier Patient Education  2024 [employer]. General Anesthesia, Adult, Care After The following information offers guidance on how to care for yourself after your procedure. Your health care provider may also give you more specific instructions. If you have problems or questions, contact your health care provider. What can I expect after the procedure? After the procedure, it is common for people to: Have pain or discomfort at the IV site. Have nausea or vomiting. Have a sore throat or hoarseness. Have trouble concentrating. Feel cold or chills. Feel weak, sleepy, or tired (fatigue). Have soreness and body  aches. These can affect parts of the body that were not involved in surgery. Follow these instructions at home: For the time period you were told by your health care provider:  Rest. Do not participate in activities where you could fall or become injured. Do not drive or use machinery. Do not drink alcohol. Do not take sleeping pills or medicines that cause drowsiness. Do not make important decisions or sign legal documents. Do not take  care of children on your own. General instructions Drink enough fluid to keep your urine pale yellow. If you have sleep apnea, surgery and certain medicines can increase your risk for breathing problems. Follow instructions from your health care provider about wearing your sleep device: Anytime you are sleeping, including during daytime naps. While taking prescription pain medicines, sleeping medicines, or medicines that make you drowsy. Return to your normal activities as told by your health care provider. Ask your health care provider what activities are safe for you. Take over-the-counter and prescription medicines only as told by your health care provider. Do not use any products that contain nicotine or tobacco. These products include cigarettes, chewing tobacco, and vaping devices, such as e-cigarettes. These can delay incision healing after surgery. If you need help quitting, ask your health care provider. Contact a health care provider if: You have nausea or vomiting that does not get better with medicine. You vomit every time you eat or drink. You have pain that does not get better with medicine. You cannot urinate or have bloody urine. You develop a skin rash. You have a fever. Get help right away if: You have trouble breathing. You have chest pain. You vomit blood. These symptoms may be an emergency. Get help right away. Call 911. Do not wait to see if the symptoms will go away. Do not drive yourself to the hospital. Summary After the  procedure, it is common to have a sore throat, hoarseness, nausea, vomiting, or to feel weak, sleepy, or fatigue. For the time period you were told by your health care provider, do not drive or use machinery. Get help right away if you have difficulty breathing, have chest pain, or vomit blood. These symptoms may be an emergency. This information is not intended to replace advice given to you by your health care provider. Make sure you discuss any questions you have with your health care provider. Document Revised: 03/13/2022 Document Reviewed: 03/13/2022 Elsevier Patient Education  2024 [employer].

## 2023-11-29 ENCOUNTER — Encounter (HOSPITAL_COMMUNITY): Payer: Self-pay

## 2023-11-29 ENCOUNTER — Encounter (HOSPITAL_COMMUNITY)
Admission: RE | Admit: 2023-11-29 | Discharge: 2023-11-29 | Disposition: A | Discharge status: Home / Self Care | Payer: Medicare Other | Source: Ambulatory Visit | Attending: Orthopedic Surgery | Admitting: Orthopedic Surgery

## 2023-11-29 VITALS — BP 137/74 | HR 84 | Temp 97.8°F | Resp 18 | Ht 70.0 in | Wt 274.0 lb

## 2023-11-29 DIAGNOSIS — I10 Essential (primary) hypertension: Secondary | ICD-10-CM | POA: Diagnosis not present

## 2023-11-29 DIAGNOSIS — M1711 Unilateral primary osteoarthritis, right knee: Secondary | ICD-10-CM | POA: Diagnosis not present

## 2023-11-29 DIAGNOSIS — Z01818 Encounter for other preprocedural examination: Secondary | ICD-10-CM | POA: Diagnosis not present

## 2023-11-29 LAB — CBC WITH DIFFERENTIAL/PLATELET
Abs Immature Granulocytes: 0.03 10*3/uL (ref 0.00–0.07)
Basophils Absolute: 0 10*3/uL (ref 0.0–0.1)
Basophils Relative: 0 %
Eosinophils Absolute: 0.2 10*3/uL (ref 0.0–0.5)
Eosinophils Relative: 2 %
HCT: 47.2 % (ref 39.0–52.0)
Hemoglobin: 16.3 g/dL (ref 13.0–17.0)
Immature Granulocytes: 0 %
Lymphocytes Relative: 24 %
Lymphs Abs: 2.2 10*3/uL (ref 0.7–4.0)
MCH: 30.3 pg (ref 26.0–34.0)
MCHC: 34.5 g/dL (ref 30.0–36.0)
MCV: 87.7 fL (ref 80.0–100.0)
Monocytes Absolute: 0.7 10*3/uL (ref 0.1–1.0)
Monocytes Relative: 7 %
Neutro Abs: 6.3 10*3/uL (ref 1.7–7.7)
Neutrophils Relative %: 67 %
Platelets: 281 10*3/uL (ref 150–400)
RBC: 5.38 MIL/uL (ref 4.22–5.81)
RDW: 13.4 % (ref 11.5–15.5)
WBC: 9.3 10*3/uL (ref 4.0–10.5)
nRBC: 0 % (ref 0.0–0.2)

## 2023-11-29 LAB — TYPE AND SCREEN
ABO/RH(D): O POS
Antibody Screen: NEGATIVE

## 2023-11-29 LAB — BASIC METABOLIC PANEL
Anion gap: 10 (ref 5–15)
BUN: 20 mg/dL (ref 8–23)
CO2: 22 mmol/L (ref 22–32)
Calcium: 9.3 mg/dL (ref 8.9–10.3)
Chloride: 107 mmol/L (ref 98–111)
Creatinine, Ser: 0.99 mg/dL (ref 0.61–1.24)
GFR, Estimated: >60 mL/min (ref >60)
Glucose, Bld: 111 mg/dL — ABNORMAL HIGH (ref 70–99)
Potassium: 3.1 mmol/L — ABNORMAL LOW (ref 3.5–5.1)
Sodium: 139 mmol/L (ref 135–145)

## 2023-11-29 LAB — SURGICAL PCR SCREEN
MRSA, PCR: NEGATIVE
Staphylococcus aureus: NEGATIVE

## 2023-11-29 LAB — PREPARE RBC (CROSSMATCH)

## 2023-11-30 ENCOUNTER — Other Ambulatory Visit: Payer: Self-pay | Admitting: Orthopedic Surgery

## 2023-11-30 DIAGNOSIS — E876 Hypokalemia: Secondary | ICD-10-CM

## 2023-11-30 MED ORDER — POTASSIUM CHLORIDE CRYS ER 20 MEQ PO TBCR: 20.0000 meq | EXTENDED_RELEASE_TABLET | Freq: Two times a day (BID) | ORAL | 0 refills | Status: DC

## 2023-12-01 ENCOUNTER — Telehealth: Payer: Self-pay | Admitting: Orthopedic Surgery

## 2023-12-01 NOTE — Telephone Encounter (Signed)
Dr. Mort Sawyers pt - pt lvm stating he would like to know what his potassium levels are, how low they are.  [phone]

## 2023-12-02 NOTE — Telephone Encounter (Signed)
Pt came into office and spoke with Tori. All questions answered.

## 2023-12-06 ENCOUNTER — Other Ambulatory Visit: Payer: Self-pay | Admitting: Orthopedic Surgery

## 2023-12-06 DIAGNOSIS — M1711 Unilateral primary osteoarthritis, right knee: Secondary | ICD-10-CM

## 2023-12-06 NOTE — H&P (Signed)
History and physical for surgery  Diagnosis osteoarthritis right knee   Planned procedure right total knee arthroplasty  Chief Complaint  Patient presents with   Knee Pain      Right knee pain     Body mass index is 39.31 kg/m.       72 year old male had his knee scoped within the last year, however at the time he was overweight and we could not replace his knee.  The arc Skippy showed that he had grade 4 changes medially and laterally   He did well for couple of months but once he got back on his feet and got back to doing his regular activities his knee pain is worsened.  He now complains of severe disabling right knee pain which has not responded well to nonoperative and operative treatment with arthroscopy he has no signs of infection   He did lose some more weight so he is ready for knee replacement   Review of systems negative for chest pain shortness of breath  bladder dysfunction allergies rashes or easy bruising or any difficulties with anesthesia Positive diarrhea  Past Medical History:  Diagnosis Date   CAD (coronary artery disease)    stents   Fatty liver    Fracture, rib 05/2021   right side   GERD (gastroesophageal reflux disease)    Heart disease    Heart murmur    childhood   Hiatal hernia    History of stress test 01/27/2012   Normal study. No significant ischemia demonstration, this low risk scan, no significant change compared to previous study.   Hypercholesteremia    Hyperlipidemia    Hypertension    Myocardial infarction (HCC) 2010   Obesity    Pneumonia 2019   Sleep apnea    cpap at night   Social History   Tobacco Use   Smoking status: Former    Current packs/day: 0.00    Types: Cigarettes, Cigars    Quit date: 01/25/2009    Years since quitting: 14.8    Passive exposure: Past   Smokeless tobacco: Never  Vaping Use   Vaping status: Never Used  Substance Use Topics   Alcohol use: No   Drug use: No    Past Surgical History:   Procedure Laterality Date   AXILLARY LYMPH NODE DISSECTION Right 07/2021   BACK SURGERY  10/2019   Dr. Franky Macho: right L4-5 laminectomy   BIOPSY  06/02/2022   Procedure: BIOPSY;  Surgeon: Marguerita Merles, Reuel Boom, MD;  Location: [facility];  Service: Gastroenterology;;   CARDIAC CATHETERIZATION  10/2009   CAD stents to the LAD using an Endeavor drug eluting (3x52mm) by Dr Clarene Duke, he had normal circumflex and RCA at the time.   COLONOSCOPY  06/15/2012   COLONOSCOPY N/A 01/09/2019   Procedure: COLONOSCOPY;  Surgeon: Malissa Hippo, MD;  Location: [facility];  Service: Endoscopy;  Laterality: N/A;  2:10pm   COLONOSCOPY WITH PROPOFOL N/A 06/02/2022   Procedure: COLONOSCOPY WITH PROPOFOL;  Surgeon: Dolores Frame, MD;  Location: [facility];  Service: Gastroenterology;  Laterality: N/A;  730 ASA 2   ESOPHAGOGASTRODUODENOSCOPY N/A 11/15/2015   Procedure: ESOPHAGOGASTRODUODENOSCOPY (EGD);  Surgeon: Malissa Hippo, MD;  Location: [facility];  Service: Endoscopy;  Laterality: N/A;  1:10 - moved to 12:55 - Ann notified pt   KNEE ARTHROSCOPY WITH MEDIAL MENISECTOMY Right 06/22/2023   Procedure: KNEE ARTHROSCOPY WITH MEDIAL AND LATERAL MENISCECTOMY;  Surgeon: Vickki Hearing, MD;  Location: [facility];  Service: Orthopedics;  Laterality: Right;   MELANOMA EXCISION  07/2021   back   POLYPECTOMY  01/09/2019   Procedure: POLYPECTOMY;  Surgeon: Malissa Hippo, MD;  Location: [facility];  Service: Endoscopy;;  colon   POLYPECTOMY  06/02/2022   Procedure: POLYPECTOMY;  Surgeon: Marguerita Merles, Reuel Boom, MD;  Location: [facility];  Service: Gastroenterology;;   TONSILLECTOMY      Family History  Problem Relation Age of Onset   Heart disease Mother    Healthy Sister    No Known Allergies   Physical examination  General Appearance well-developed well-nourished grooming and hygiene are normal body habitus endomorphic  Orientation person place and time normal  Mood  pleasant affect normal  Gait varus without thrust  Skin normal clean no erythema  Cardiovascular normal pulse and temperature in the operative leg without edema swelling or significant varicosities  Lower extremity lymph nodes negative  Sensory exam normal  Refill is equal normal no pathologic reflexes  Coordinate balance normal  He stands with significant varus right knee small effusion Medial condyle femur tender lateral condyle not so much medial tibial plateau and joint line tender   Definite flexion contracture   Arc of motion is 10-120 degrees   X-ray today significantly worse x-ray than last year he is now bone-on-bone grade 4 disease varus alignment osteophytes are minimal but the medial compartment is completely destroyed   Medical problems to be aware of coronary artery disease status post percutaneous angioplasty hypertension morbid obesity sleep apnea spondylolisthesis lumbar spine history of diarrhea    Somewhat higher risk for knee replacement surgery   Cardiology has cleared the patient for surgery  He has no steps to get in the house Downstairs bathroom Expect him out of work 6 to 8 weeks   DVT discussed Infection discussed Flexion contracture discussed Postop stiffness discussed  Right total knee arthroplasty for right knee arthritis

## 2023-12-07 ENCOUNTER — Ambulatory Visit (HOSPITAL_COMMUNITY): Payer: Medicare Other | Admitting: Anesthesiology

## 2023-12-07 ENCOUNTER — Encounter (HOSPITAL_COMMUNITY): Payer: Self-pay | Admitting: Orthopedic Surgery

## 2023-12-07 ENCOUNTER — Encounter (HOSPITAL_COMMUNITY)
Admission: RE | Disposition: A | Discharge status: Home / Self Care | Payer: Self-pay | Source: Ambulatory Visit | Attending: Orthopedic Surgery

## 2023-12-07 ENCOUNTER — Ambulatory Visit (HOSPITAL_COMMUNITY): Payer: Medicare Other

## 2023-12-07 ENCOUNTER — Observation Stay (HOSPITAL_COMMUNITY)
Admission: RE | Admit: 2023-12-07 | Discharge: 2023-12-08 | Disposition: A | Discharge status: Home / Self Care | Payer: Medicare Other | Source: Ambulatory Visit | Attending: Orthopedic Surgery | Admitting: Orthopedic Surgery

## 2023-12-07 ENCOUNTER — Other Ambulatory Visit: Payer: Self-pay

## 2023-12-07 DIAGNOSIS — Z471 Aftercare following joint replacement surgery: Secondary | ICD-10-CM | POA: Diagnosis not present

## 2023-12-07 DIAGNOSIS — I119 Hypertensive heart disease without heart failure: Secondary | ICD-10-CM | POA: Diagnosis not present

## 2023-12-07 DIAGNOSIS — Z01818 Encounter for other preprocedural examination: Secondary | ICD-10-CM

## 2023-12-07 DIAGNOSIS — G4730 Sleep apnea, unspecified: Secondary | ICD-10-CM | POA: Diagnosis not present

## 2023-12-07 DIAGNOSIS — M1711 Unilateral primary osteoarthritis, right knee: Secondary | ICD-10-CM

## 2023-12-07 DIAGNOSIS — I10 Essential (primary) hypertension: Principal | ICD-10-CM

## 2023-12-07 DIAGNOSIS — Z87891 Personal history of nicotine dependence: Secondary | ICD-10-CM | POA: Diagnosis not present

## 2023-12-07 DIAGNOSIS — I2510 Atherosclerotic heart disease of native coronary artery without angina pectoris: Secondary | ICD-10-CM | POA: Insufficient documentation

## 2023-12-07 DIAGNOSIS — Z96651 Presence of right artificial knee joint: Secondary | ICD-10-CM | POA: Diagnosis not present

## 2023-12-07 HISTORY — PX: TOTAL KNEE ARTHROPLASTY: SHX125

## 2023-12-07 SURGERY — ARTHROPLASTY, KNEE, TOTAL
Anesthesia: Spinal | Site: Knee | Laterality: Right

## 2023-12-07 MED ORDER — ASPIRIN 81 MG PO CHEW
81.0000 mg | CHEWABLE_TABLET | Freq: Two times a day (BID) | ORAL | Status: DC
  Administered 2023-12-07 – 2023-12-08 (×2): 81 mg via ORAL
  Filled 2023-12-07 (×2): qty 1

## 2023-12-07 MED ORDER — PHENOL 1.4 % MT LIQD: 1.0000 | OROMUCOSAL | Status: DC | PRN

## 2023-12-07 MED ORDER — CHLORHEXIDINE GLUCONATE 0.12 % MT SOLN
15.0000 mL | Freq: Once | OROMUCOSAL | Status: AC
  Administered 2023-12-07: 15 mL via OROMUCOSAL
  Filled 2023-12-07: qty 15

## 2023-12-07 MED ORDER — POLYETHYLENE GLYCOL 3350 17 G PO PACK
17.0000 g | PACK | Freq: Every day | ORAL | Status: DC
  Administered 2023-12-07 – 2023-12-08 (×2): 17 g via ORAL
  Filled 2023-12-07 (×2): qty 1

## 2023-12-07 MED ORDER — TRAMADOL HCL 50 MG PO TABS
50.0000 mg | ORAL_TABLET | Freq: Four times a day (QID) | ORAL | Status: DC
Start: 2023-12-07 — End: 2023-12-08
  Administered 2023-12-07 – 2023-12-08 (×3): 50 mg via ORAL
  Filled 2023-12-07 (×4): qty 1

## 2023-12-07 MED ORDER — PROPOFOL 500 MG/50ML IV EMUL
INTRAVENOUS | Status: DC | PRN
  Administered 2023-12-07: 100 ug/kg/min via INTRAVENOUS

## 2023-12-07 MED ORDER — OXYCODONE HCL 5 MG PO TABS: 5.0000 mg | ORAL_TABLET | Freq: Once | ORAL | Status: DC

## 2023-12-07 MED ORDER — ONDANSETRON HCL 4 MG PO TABS: 4.0000 mg | ORAL_TABLET | Freq: Four times a day (QID) | ORAL | Status: DC | PRN

## 2023-12-07 MED ORDER — HYDROCODONE-ACETAMINOPHEN 5-325 MG PO TABS: 1.0000 | ORAL_TABLET | ORAL | Status: DC | PRN

## 2023-12-07 MED ORDER — PHENYLEPHRINE HCL-NACL 20-0.9 MG/250ML-% IV SOLN
INTRAVENOUS | Status: AC
Start: 2023-12-07 — End: ?
  Filled 2023-12-07: qty 250

## 2023-12-07 MED ORDER — BUPIVACAINE-MELOXICAM ER 200-6 MG/7ML IJ SOLN
INTRAMUSCULAR | Status: AC
  Filled 2023-12-07: qty 2

## 2023-12-07 MED ORDER — PREGABALIN 50 MG PO CAPS
50.0000 mg | ORAL_CAPSULE | Freq: Three times a day (TID) | ORAL | Status: DC
  Administered 2023-12-07 – 2023-12-08 (×4): 50 mg via ORAL
  Filled 2023-12-07 (×4): qty 1

## 2023-12-07 MED ORDER — TRANEXAMIC ACID-NACL 1000-0.7 MG/100ML-% IV SOLN
1000.0000 mg | INTRAVENOUS | Status: AC
  Administered 2023-12-07: 1000 mg via INTRAVENOUS
  Filled 2023-12-07: qty 100

## 2023-12-07 MED ORDER — FENTANYL CITRATE PF 50 MCG/ML IJ SOSY: 25.0000 ug | PREFILLED_SYRINGE | INTRAMUSCULAR | Status: DC | PRN

## 2023-12-07 MED ORDER — ALUM & MAG HYDROXIDE-SIMETH 200-200-20 MG/5ML PO SUSP
30.0000 mL | ORAL | Status: DC | PRN
Start: 2023-12-07 — End: 2023-12-08

## 2023-12-07 MED ORDER — SODIUM CHLORIDE 0.9 % IR SOLN
Status: DC | PRN
  Administered 2023-12-07: 2000 mL
  Administered 2023-12-07: 3000 mL

## 2023-12-07 MED ORDER — ORAL CARE MOUTH RINSE: 15.0000 mL | Freq: Once | OROMUCOSAL | Status: AC

## 2023-12-07 MED ORDER — ONDANSETRON HCL 4 MG/2ML IJ SOLN
4.0000 mg | Freq: Four times a day (QID) | INTRAMUSCULAR | Status: DC | PRN
Start: 2023-12-07 — End: 2023-12-08

## 2023-12-07 MED ORDER — MENTHOL 3 MG MT LOZG: 1.0000 | LOZENGE | OROMUCOSAL | Status: DC | PRN

## 2023-12-07 MED ORDER — DOCUSATE SODIUM 100 MG PO CAPS
100.0000 mg | ORAL_CAPSULE | Freq: Two times a day (BID) | ORAL | Status: DC
  Administered 2023-12-07 – 2023-12-08 (×2): 100 mg via ORAL
  Filled 2023-12-07 (×2): qty 1

## 2023-12-07 MED ORDER — MIDAZOLAM HCL 5 MG/5ML IJ SOLN
INTRAMUSCULAR | Status: DC | PRN
  Administered 2023-12-07: 2 mg via INTRAVENOUS

## 2023-12-07 MED ORDER — HYDROCHLOROTHIAZIDE 12.5 MG PO TABS
12.5000 mg | ORAL_TABLET | Freq: Every day | ORAL | Status: DC
  Administered 2023-12-08: 12.5 mg via ORAL
  Filled 2023-12-07: qty 1

## 2023-12-07 MED ORDER — 0.9 % SODIUM CHLORIDE (POUR BTL) OPTIME
TOPICAL | Status: DC | PRN
  Administered 2023-12-07: 1000 mL

## 2023-12-07 MED ORDER — VITAMIN C 500 MG PO TABS
500.0000 mg | ORAL_TABLET | Freq: Every day | ORAL | Status: DC
  Administered 2023-12-07 – 2023-12-08 (×2): 500 mg via ORAL
  Filled 2023-12-07 (×2): qty 1

## 2023-12-07 MED ORDER — TRANEXAMIC ACID-NACL 1000-0.7 MG/100ML-% IV SOLN
1000.0000 mg | Freq: Once | INTRAVENOUS | Status: AC
  Administered 2023-12-07: 1000 mg via INTRAVENOUS
  Filled 2023-12-07: qty 100

## 2023-12-07 MED ORDER — ONDANSETRON HCL 4 MG/2ML IJ SOLN
4.0000 mg | Freq: Once | INTRAMUSCULAR | Status: AC | PRN
  Administered 2023-12-07: 4 mg via INTRAVENOUS

## 2023-12-07 MED ORDER — ONDANSETRON HCL 4 MG/2ML IJ SOLN
4.0000 mg | Freq: Four times a day (QID) | INTRAMUSCULAR | Status: DC
  Administered 2023-12-07 – 2023-12-08 (×2): 4 mg via INTRAVENOUS
  Filled 2023-12-07 (×2): qty 2

## 2023-12-07 MED ORDER — MORPHINE SULFATE (PF) 2 MG/ML IV SOLN: 0.5000 mg | INTRAVENOUS | Status: DC | PRN

## 2023-12-07 MED ORDER — NITROGLYCERIN 0.4 MG SL SUBL: 0.4000 mg | SUBLINGUAL_TABLET | SUBLINGUAL | Status: DC | PRN

## 2023-12-07 MED ORDER — POVIDONE-IODINE 10 % EX SWAB
2.0000 | Freq: Once | CUTANEOUS | Status: AC
  Administered 2023-12-07: 2 via TOPICAL

## 2023-12-07 MED ORDER — MIRABEGRON ER 25 MG PO TB24
50.0000 mg | ORAL_TABLET | Freq: Every day | ORAL | Status: DC
  Administered 2023-12-07 – 2023-12-08 (×2): 50 mg via ORAL
  Filled 2023-12-07 (×2): qty 2

## 2023-12-07 MED ORDER — CHLORHEXIDINE GLUCONATE CLOTH 2 % EX PADS
6.0000 | MEDICATED_PAD | Freq: Every day | CUTANEOUS | Status: DC
  Administered 2023-12-07: 6 via TOPICAL

## 2023-12-07 MED ORDER — CELECOXIB 100 MG PO CAPS
200.0000 mg | ORAL_CAPSULE | Freq: Every day | ORAL | Status: DC
  Administered 2023-12-07 – 2023-12-08 (×2): 200 mg via ORAL
  Filled 2023-12-07 (×2): qty 2

## 2023-12-07 MED ORDER — METHOCARBAMOL 1000 MG/10ML IJ SOLN
500.0000 mg | Freq: Four times a day (QID) | INTRAMUSCULAR | Status: DC
  Administered 2023-12-07 – 2023-12-08 (×5): 500 mg via INTRAVENOUS
  Filled 2023-12-07 (×5): qty 10

## 2023-12-07 MED ORDER — METHOCARBAMOL 500 MG PO TABS: 500.0000 mg | ORAL_TABLET | Freq: Four times a day (QID) | ORAL | Status: DC | PRN

## 2023-12-07 MED ORDER — CEFAZOLIN SODIUM-DEXTROSE 1-4 GM/50ML-% IV SOLN
INTRAVENOUS | Status: AC
  Filled 2023-12-07: qty 50

## 2023-12-07 MED ORDER — LOSARTAN POTASSIUM 50 MG PO TABS
100.0000 mg | ORAL_TABLET | Freq: Every day | ORAL | Status: DC
  Administered 2023-12-08: 100 mg via ORAL
  Filled 2023-12-07: qty 2

## 2023-12-07 MED ORDER — OXYCODONE HCL 5 MG/5ML PO SOLN: 5.0000 mg | Freq: Once | ORAL | Status: DC | PRN

## 2023-12-07 MED ORDER — METHOCARBAMOL 1000 MG/10ML IJ SOLN: 500.0000 mg | Freq: Four times a day (QID) | INTRAMUSCULAR | Status: DC | PRN

## 2023-12-07 MED ORDER — ROSUVASTATIN CALCIUM 10 MG PO TABS
10.0000 mg | ORAL_TABLET | Freq: Every day | ORAL | Status: DC
  Administered 2023-12-07 – 2023-12-08 (×2): 10 mg via ORAL
  Filled 2023-12-07 (×2): qty 1

## 2023-12-07 MED ORDER — DEXAMETHASONE SODIUM PHOSPHATE 10 MG/ML IJ SOLN
10.0000 mg | Freq: Once | INTRAMUSCULAR | Status: AC
  Administered 2023-12-08: 10 mg via INTRAVENOUS
  Filled 2023-12-07: qty 1

## 2023-12-07 MED ORDER — ALPRAZOLAM 0.25 MG PO TABS
0.2500 mg | ORAL_TABLET | Freq: Every day | ORAL | Status: DC
  Administered 2023-12-07: 0.25 mg via ORAL
  Filled 2023-12-07: qty 1

## 2023-12-07 MED ORDER — CEFAZOLIN SODIUM-DEXTROSE 2-4 GM/100ML-% IV SOLN
2.0000 g | INTRAVENOUS | Status: AC
  Administered 2023-12-07: 3 g via INTRAVENOUS
  Filled 2023-12-07: qty 100

## 2023-12-07 MED ORDER — ROPIVACAINE HCL 5 MG/ML IJ SOLN
INTRAMUSCULAR | Status: AC
  Filled 2023-12-07: qty 30

## 2023-12-07 MED ORDER — LOSARTAN POTASSIUM-HCTZ 100-12.5 MG PO TABS: 1.0000 | ORAL_TABLET | Freq: Every day | ORAL | Status: DC

## 2023-12-07 MED ORDER — ACETAMINOPHEN 325 MG PO TABS: 325.0000 mg | ORAL_TABLET | Freq: Four times a day (QID) | ORAL | Status: DC | PRN

## 2023-12-07 MED ORDER — BUPIVACAINE-MELOXICAM ER 200-6 MG/7ML IJ SOLN
INTRAMUSCULAR | Status: DC | PRN
  Administered 2023-12-07: 400 mg

## 2023-12-07 MED ORDER — METOCLOPRAMIDE HCL 5 MG/ML IJ SOLN
5.0000 mg | Freq: Three times a day (TID) | INTRAMUSCULAR | Status: DC | PRN
Start: 2023-12-07 — End: 2023-12-08

## 2023-12-07 MED ORDER — SODIUM CHLORIDE 0.9 % IV SOLN: INTRAVENOUS | Status: DC

## 2023-12-07 MED ORDER — PROPOFOL 500 MG/50ML IV EMUL
INTRAVENOUS | Status: AC
  Filled 2023-12-07: qty 150

## 2023-12-07 MED ORDER — NEBIVOLOL HCL 10 MG PO TABS
5.0000 mg | ORAL_TABLET | Freq: Every day | ORAL | Status: DC
  Administered 2023-12-08: 5 mg via ORAL
  Filled 2023-12-07: qty 1

## 2023-12-07 MED ORDER — METOCLOPRAMIDE HCL 10 MG PO TABS: 5.0000 mg | ORAL_TABLET | Freq: Three times a day (TID) | ORAL | Status: DC | PRN

## 2023-12-07 MED ORDER — OXYCODONE HCL 5 MG PO TABS: 5.0000 mg | ORAL_TABLET | Freq: Once | ORAL | Status: DC | PRN

## 2023-12-07 MED ORDER — PHENYLEPHRINE HCL-NACL 20-0.9 MG/250ML-% IV SOLN
INTRAVENOUS | Status: DC | PRN
  Administered 2023-12-07: 20 ug/min via INTRAVENOUS

## 2023-12-07 MED ORDER — LACTATED RINGERS IV SOLN: INTRAVENOUS | Status: DC

## 2023-12-07 MED ORDER — CEFAZOLIN SODIUM-DEXTROSE 2-4 GM/100ML-% IV SOLN
2.0000 g | Freq: Four times a day (QID) | INTRAVENOUS | Status: AC
  Administered 2023-12-07 (×2): 2 g via INTRAVENOUS
  Filled 2023-12-07 (×2): qty 100

## 2023-12-07 MED ORDER — DEXAMETHASONE SODIUM PHOSPHATE 10 MG/ML IJ SOLN
INTRAMUSCULAR | Status: DC | PRN
  Administered 2023-12-07: 10 mg via INTRAVENOUS

## 2023-12-07 MED ORDER — ADULT MULTIVITAMIN W/MINERALS CH
1.0000 | ORAL_TABLET | Freq: Every day | ORAL | Status: DC
  Administered 2023-12-08: 1 via ORAL
  Filled 2023-12-07: qty 1

## 2023-12-07 MED ORDER — HYDROCODONE-ACETAMINOPHEN 7.5-325 MG PO TABS: 1.0000 | ORAL_TABLET | ORAL | Status: DC | PRN

## 2023-12-07 MED ORDER — MIDAZOLAM HCL 2 MG/2ML IJ SOLN
INTRAMUSCULAR | Status: AC
  Filled 2023-12-07: qty 2

## 2023-12-07 SURGICAL SUPPLY — 59 items
ATTUNE MED DOME PAT 38 KNEE (Knees) IMPLANT
ATTUNE PS FEM RT SZ 7 CEM KNEE (Femur) IMPLANT
BANDAGE ESMARK 6X9 LF (GAUZE/BANDAGES/DRESSINGS) ×2 IMPLANT
BASE TIBIAL CEM ATTUNE SZ 7 (Knees) ×1 IMPLANT
BASEPLATE TIB CEM ATTUNE SZ7 (Knees) IMPLANT
BLADE SAGITTAL 25.0X1.27X90 (BLADE) ×2 IMPLANT
BLADE SAW SGTL 11.0X1.19X90.0M (BLADE) ×2 IMPLANT
BLADE SURG SZ10 CARB STEEL (BLADE) ×2 IMPLANT
BNDG ELASTIC 4X5.8 VLCR NS LF (GAUZE/BANDAGES/DRESSINGS) ×4 IMPLANT
BNDG ELASTIC 6X5.8 VLCR NS LF (GAUZE/BANDAGES/DRESSINGS) ×2 IMPLANT
BNDG ESMARK 6X9 LF (GAUZE/BANDAGES/DRESSINGS) ×1
CEMENT HV SMART SET (Cement) ×4 IMPLANT
CLOTH BEACON ORANGE TIMEOUT ST (SAFETY) ×2 IMPLANT
COOLER ICEMAN CLASSIC (MISCELLANEOUS) ×2 IMPLANT
COUNTER NDL MAGNETIC 40 RED (SET/KITS/TRAYS/PACK) ×2 IMPLANT
COUNTER NEEDLE MAGNETIC 40 RED (SET/KITS/TRAYS/PACK) ×1
COVER LIGHT HANDLE STERIS (MISCELLANEOUS) ×4 IMPLANT
CUFF TRNQT CYL 34X4.125X (TOURNIQUET CUFF) ×2 IMPLANT
DRAPE BACK TABLE (DRAPES) ×2 IMPLANT
DRAPE EXTREMITY T 121X128X90 (DISPOSABLE) ×2 IMPLANT
DRESSING AQUACEL AG ADV 3.5X12 (MISCELLANEOUS) ×2 IMPLANT
DRSG AQUACEL AG ADV 3.5X10 (GAUZE/BANDAGES/DRESSINGS) IMPLANT
DRSG AQUACEL AG ADV 3.5X12 (MISCELLANEOUS) ×1
DURAPREP 26ML APPLICATOR (WOUND CARE) ×4 IMPLANT
ELECT REM PT RETURN 9FT ADLT (ELECTROSURGICAL) ×1
ELECTRODE REM PT RTRN 9FT ADLT (ELECTROSURGICAL) ×2 IMPLANT
GLOVE BIOGEL PI IND STRL 7.0 (GLOVE) ×12 IMPLANT
GLOVE BIOGEL PI IND STRL 8.5 (GLOVE) ×2 IMPLANT
GLOVE SKINSENSE STRL SZ8.0 LF (GLOVE) ×2 IMPLANT
GOWN STRL REUS W/TWL LRG LVL3 (GOWN DISPOSABLE) ×6 IMPLANT
GOWN STRL REUS W/TWL XL LVL3 (GOWN DISPOSABLE) ×2 IMPLANT
HOOD PEEL AWAY T7 (MISCELLANEOUS) ×8 IMPLANT
INSERT TIBIAL ATTUNE SZ 7X7 (Insert) IMPLANT
INST SET MAJOR BONE (KITS) ×2 IMPLANT
IV NS IRRIG 3000ML ARTHROMATIC (IV SOLUTION) ×2 IMPLANT
KIT TURNOVER KIT A (KITS) ×2 IMPLANT
MANIFOLD NEPTUNE II (INSTRUMENTS) ×2 IMPLANT
MARKER SKIN DUAL TIP RULER LAB (MISCELLANEOUS) ×2 IMPLANT
NS IRRIG 1000ML POUR BTL (IV SOLUTION) ×2 IMPLANT
PACK TOTAL JOINT (CUSTOM PROCEDURE TRAY) ×2 IMPLANT
PAD ARMBOARD 7.5X6 YLW CONV (MISCELLANEOUS) ×2 IMPLANT
PAD COLD SHLDR SM WRAP-ON (PAD) ×2 IMPLANT
PILLOW KNEE EXTENSION 0 DEG (MISCELLANEOUS) ×2 IMPLANT
PIN/DRILL PACK ORTHO 1/8X3.0 (PIN) ×2 IMPLANT
POSITIONER HEAD 8X9X4 ADT (SOFTGOODS) ×2 IMPLANT
SAW OSC TIP CART 19.5X105X1.3 (SAW) ×2 IMPLANT
SET BASIN LINEN APH (SET/KITS/TRAYS/PACK) ×2 IMPLANT
SET HNDPC FAN SPRY TIP SCT (DISPOSABLE) ×2 IMPLANT
SOLUTION IRRIG SURGIPHOR (IV SOLUTION) ×2 IMPLANT
STAPLER SKIN PROX WIDE 3.9 (STAPLE) ×2 IMPLANT
SUT BRALON NAB BRD #1 30IN (SUTURE) ×2 IMPLANT
SUT MNCRL 0 VIOLET CTX 36 (SUTURE) ×2 IMPLANT
SUT MON AB 0 CT1 (SUTURE) ×2 IMPLANT
SYR BULB IRRIG 60ML STRL (SYRINGE) ×2 IMPLANT
TOWEL OR 17X26 4PK STRL BLUE (TOWEL DISPOSABLE) ×2 IMPLANT
TOWER CARTRIDGE SMART MIX (DISPOSABLE) ×2 IMPLANT
TRAY FOLEY MTR SLVR 16FR STAT (SET/KITS/TRAYS/PACK) ×2 IMPLANT
WATER STERILE IRR 1000ML POUR (IV SOLUTION) ×4 IMPLANT
YANKAUER SUCT 12FT TUBE ARGYLE (SUCTIONS) ×2 IMPLANT

## 2023-12-07 NOTE — Progress Notes (Signed)
Patient moved to room 3 in phase 2 area.

## 2023-12-07 NOTE — Brief Op Note (Signed)
12/07/2023  10:30 AM  PATIENT:  Antonio Colon  72 y.o. male  PRE-OPERATIVE DIAGNOSIS:  Right knee osteoarthritis  POST-OPERATIVE DIAGNOSIS:  Right knee osteoarthritis  PROCEDURE:  Procedure(s): TOTAL KNEE ARTHROPLASTY (Right)  SURGEON:  Surgeons and Role:    Vickki Hearing, MD - Primary  PHYSICIAN ASSISTANT:   ASSISTANTS: cynthia wrenn   ANESTHESIA:   spinal and adductor canal   EBL:  50 mL   BLOOD ADMINISTERED:none  DRAINS: none   LOCAL MEDICATIONS USED:  OTHER zinrelef  SPECIMEN:  No Specimen  DISPOSITION OF SPECIMEN:  N/A  COUNTS:  YES  TOURNIQUET:   Total Tourniquet Time Documented: Thigh (Right) - 87 minutes Total: Thigh (Right) - 87 minutes   DICTATION: .Reubin Milan Dictation  PLAN OF CARE: Admit for overnight observation  PATIENT DISPOSITION:  PACU - hemodynamically stable.   Delay start of Pharmacological VTE agent (>24hrs) due to surgical blood loss or risk of bleeding: yes

## 2023-12-07 NOTE — Plan of Care (Signed)
  Problem: Acute Rehab PT Goals(only PT should resolve) Goal: Pt Will Go Supine/Side To Sit Outcome: Progressing Flowsheets (Taken 12/07/2023 1530) Pt will go Supine/Side to Sit:  with modified independence  Independently Goal: Patient Will Transfer Sit To/From Stand Outcome: Progressing Flowsheets (Taken 12/07/2023 1530) Patient will transfer sit to/from stand: with modified independence Goal: Pt Will Transfer Bed To Chair/Chair To Bed Outcome: Progressing Flowsheets (Taken 12/07/2023 1530) Pt will Transfer Bed to Chair/Chair to Bed: with modified independence Goal: Pt Will Ambulate Outcome: Progressing Flowsheets (Taken 12/07/2023 1530) Pt will Ambulate:  > 125 feet  with modified independence  with rolling walker   3:31 PM, 12/07/23 Ocie Bob, MPT Physical Therapist with [facility] [phone] office [phone] mobile phone

## 2023-12-07 NOTE — Transfer of Care (Signed)
Immediate Anesthesia Transfer of Care Note  Patient: Antonio Colon  Procedure(s) Performed: TOTAL KNEE ARTHROPLASTY (Right: Knee)  Patient Location: PACU  Anesthesia Type:General  Level of Consciousness: awake, alert , oriented, and patient cooperative  Airway & Oxygen Therapy: Patient Spontanous Breathing and Patient connected to nasal cannula oxygen  Post-op Assessment: Report given to RN and Post -op Vital signs reviewed and stable  Post vital signs: Reviewed and stable  Last Vitals:  Vitals Value Taken Time  BP 103/52 12/07/23 1000  Temp 36.8 C 12/07/23 0953  Pulse 61 12/07/23 1003  Resp 16 12/07/23 1003  SpO2 95 % 12/07/23 1003  Vitals shown include unfiled device data.  Last Pain:  Vitals:   12/07/23 0953  TempSrc:   PainSc: 0-No pain         Complications: No notable events documented.

## 2023-12-07 NOTE — Anesthesia Procedure Notes (Signed)
Spinal  Patient location during procedure: OR Start time: 12/07/2023 7:46 AM End time: 12/07/2023 7:50 AM Reason for block: surgical anesthesia Staffing Performed: anesthesiologist  Anesthesiologist: Windell Norfolk, MD Resident/CRNA: Shanon Payor, CRNA Performed by: Shanon Payor, CRNA Authorized by: Windell Norfolk, MD   Preanesthetic Checklist Completed: patient identified, IV checked, site marked, risks and benefits discussed, surgical consent, monitors and equipment checked, pre-op evaluation and timeout performed Spinal Block Patient position: sitting Prep: ChloraPrep Patient monitoring: heart rate, cardiac monitor, continuous pulse ox and blood pressure Approach: midline Location: L3-4 Injection technique: single-shot Needle Needle type: Sprotte  Needle gauge: 22 G Needle length: 10 cm Assessment Sensory level: T6 Additional Notes Attempted spinal by Graylin Shiver CRNA, unable to successfully place catheter, Attempt by Dr. Emeterio Reeve, successful placement, CSF obtained. T6 level bilat. Tolerated well by patient.

## 2023-12-07 NOTE — Op Note (Signed)
12/07/2023  10:30 AM  PATIENT:  Antonio Colon  72 y.o. male  PRE-OPERATIVE DIAGNOSIS:  Right knee osteoarthritis  POST-OPERATIVE DIAGNOSIS:  Right knee osteoarthritis  PROCEDURE:  Procedure(s): TOTAL KNEE ARTHROPLASTY (Right)  SURGEON:  Surgeons and Role:    Vickki Hearing, MD - Primary  PHYSICIAN ASSISTANT:   ASSISTANTS: cynthia wrenn   ANESTHESIA:   spinal and adductor canal   EBL:  50 mL   BLOOD ADMINISTERED:none  DRAINS: none   LOCAL MEDICATIONS USED:  OTHER zinrelef  SPECIMEN:  No Specimen  DISPOSITION OF SPECIMEN:  N/A  COUNTS:  YES  TOURNIQUET:   Total Tourniquet Time Documented: Thigh (Right) - 87 minutes Total: Thigh (Right) - 87 minutes   DICTATION: .Reubin Milan Dictation  PLAN OF CARE: Admit for overnight observation  PATIENT DISPOSITION:  PACU - hemodynamically stable.   Delay start of Pharmacological VTE agent (>24hrs) due to surgical blood loss or risk of bleeding: yes   Dictation for total knee replacement  Orthopaedic Surgery Operative Note (CSN: [id number])  Everardo All [place]  [birth year] Date of Surgery: 12/07/2023     Implants: Implant Name Type Inv. Item Serial No. Manufacturer Lot No. LRB No. Used Action  CEMENT HV SMART SET - [id number] Cement CEMENT HV SMART SET  [employer] [id number] Right 2 Implanted  attune tibial insert    [employer]PU[employer] [id number] Right 1 Implanted  BASE TIBIAL CEM ATTUNE SZ 7 - [id number] Knees BASE TIBIAL CEM ATTUNE SZ 7  [employer] [id number] Right 1 Implanted  ATTUNE MED DOME PAT 38 KNEE - [id number] Knees ATTUNE MED DOME PAT 38 KNEE  [employer]PU[employer] [id number] Right 1 Implanted  ATTUNE PS FEM RT SZ 7 CEM KNEE - [id number] Femur ATTUNE PS FEM RT SZ 7 CEM KNEE  [employer] [id number] Right 1 Implanted     Indications for Surgery:   Disabling pain of the  right  knee. The benefits and risks of operative and nonoperative management were discussed prior to surgery with patient/guardian(s) and  informed consent form was completed.  While all risks cannot be anticipated, specific risks including infection, need for additional surgery, stiffness, postop pain, infection, implant removal, loosening, infection requiring amputation, deep vein thrombosis, pulmonary embolus were discussed.   Procedure:    Details of surgery: The patient was identified by 2 approved identification mechanisms. The operative extremity was evaluated and found to be acceptable for surgical treatment today. The chart was reviewed. The surgical site was confirmed and marked. The patient had a saphenous nerve block in PACU   The patient was taken to the operating room and given appropriate antibiotic ancef 2gm . This is consistent with the SCIP protocol.  The patient was given the following anesthetic: spinal   The patient was then placed supine on the operating table. A Foley catheter was inserted. The operative extremity was prepped and draped sterilely from the toes to the groin.  Timeout was executed confirming the patient's name, surgical site, antibiotic administration, x-rays available, and implants available.  The operative limb,  was exsanguinated with a six-inch Esmarch and the tourniquet was inflated to 280 mmHg.  A straight midline incision was made over the right KNEE and taken down to the extensor mechanism. A medial arthrotomy was performed. The patella was everted and the patellofemoral soft tissue was released, along with the patellar fat pad.  The anterior cruciate ligament and PCL were resected.  The anterior horns of the lateral and medial meniscus were resected. The medial soft  tissue sleeve was elevated to the mid coronal plane.  A three-eighths inch drill bit was used to enter the femoral canal which was decompressed with suction and irrigation until clear.   The distal femoral cutting guide was set for 11 mm distal resection,  5valgus alignment, for a right  knee. The distal femur was  resected and checked for flatness.  The attune sizing femoral guide was placed and the femur was preliminarily sized to a size 7 .   The external alignment guide for the tibial resection was then applied to the distal and proximal tibia and set for anatomic slope along with 9 MM resection  from the  LATERAL SIDE  .   Rotational alignment was set using the malleolus, the tibial tubercle and the tibial spines.  The proximal tibia was resected along with  residual menisci. The tibia was sized using a base plate to a size  7 .   The extension gap was checked.  A SIZE 7 GAVE THE BEST STABILITY AND EXTENSION  The femur was resized to a size 7  The femoral cutting block was placed and the size 7spacer block was used to assess the posterior condyle resection.  I MOVED THE BLOCK DOWN   Rotation was assessed using Whitesides line and the spacer block.  Once I was satisfied with the spacer block collateral ligament retractors were placed and I completed the 4 distal femoral cuts   The extension gap was rechecked with the size 7 spacer block   The correct sized notch cutting guide for the femur was then applied and the notch cut was made.  Trial reduction was completed using size 7 trial implants. Patella tracking was normal  We then skeletonized the patella. It measured 25.5 in thickness and the patellar resection was set for 9.5 millimeters. the patellar resection was completed. The patella diameter measured 38. We then drilled the peg holes for the patella.    The proximal tibia was prepared using the size 7 base plate.  Thorough irrigation was performed using saline  and the bone was dried and prepared for cement. The cement was mixed on the back table using third generation preparation techniques  The implants were then cemented in place and excess cement was removed. The cement was allowed to cure.  Ailene irrigation followed by Surgipor irrigation.  Any excess bone fragments and cement  was removed.  The extensor mechanism was closed with #1 Bralon suture followed by   Zynrelef a total of 2 vials were injected prior to complete extensor mechanism closure.  The openings and the capsule were then closed with #1 Braylon)  Subcutaneous tissue closure using 0 Monocryl   Skin approximation was performed using staples  A sterile dressing was applied, TED hose were placed on the operative extremity followed by Cryo/Cuff.  The patient was taken recovery room in stable condition  Postop plan: Weightbearing as tolerated CPM machine Immediate physical therapy Discharge tomorrow if stable

## 2023-12-07 NOTE — Evaluation (Signed)
Physical Therapy Evaluation Patient Details Name: Antonio Colon MRN: [id number] DOB: 1951/11/23 Today's Date: 12/07/2023   RIGHT KNEE ROM: 0 - 100 degrees AMBULATION DISTANCE: 100  feet using RW with Modified Independence/Supervision    History of Present Illness  Antonio Colon is a 72 y/o male s/p Right TKA on 12/07/23, with the diagnosis of Right knee osteoarthritis.  Clinical Impression  Patient demonstrates good return for moving RLE during bed mobility, completing sit to stands, transfers and ambulation in room/hallway without loss of balance.  Patient tolerated sitting up in chair after therapy with RLE dangling.  Patient will benefit from continued skilled physical therapy in hospital and recommended venue below to increase strength, balance, endurance for safe ADLs and gait.          If plan is discharge home, recommend the following: A little help with walking and/or transfers;A little help with bathing/dressing/bathroom;Help with stairs or ramp for entrance;Assistance with cooking/housework   Can travel by private vehicle        Equipment Recommendations None recommended by PT  Recommendations for Other Services       Functional Status Assessment Patient has had a recent decline in their functional status and demonstrates the ability to make significant improvements in function in a reasonable and predictable amount of time.     Precautions / Restrictions Precautions Precautions: Fall Restrictions Weight Bearing Restrictions: Yes RLE Weight Bearing: Weight bearing as tolerated      Mobility  Bed Mobility Overal bed mobility: Modified Independent                  Transfers Overall transfer level: Modified independent                      Ambulation/Gait Ambulation/Gait assistance: Supervision, Modified independent (Device/Increase time) Gait Distance (Feet): 100 Feet Assistive device: Rolling walker (2 wheels) Gait Pattern/deviations:  Decreased step length - right, Decreased step length - left, Decreased stance time - right, Antalgic, Decreased stride length Gait velocity: decreased     General Gait Details: slightly labored cadence with fair/good return for right heel to toe stepping without loss of balance, limited mostly due to fatigue  Stairs            Wheelchair Mobility     Tilt Bed    Modified Rankin (Stroke Patients Only)       Balance Overall balance assessment: Needs assistance Sitting-balance support: Feet supported, No upper extremity supported Sitting balance-Leahy Scale: Good Sitting balance - Comments: seated at EOB   Standing balance support: During functional activity, Bilateral upper extremity supported Standing balance-Leahy Scale: Fair Standing balance comment: fair/good using RW                             Pertinent Vitals/Pain Pain Assessment Pain Assessment: Faces Faces Pain Scale: Hurts a little bit Pain Location: right knee Pain Descriptors / Indicators: Discomfort, Sore Pain Intervention(s): Limited activity within patient's tolerance, Monitored during session, Repositioned    Home Living Family/patient expects to be discharged to:: Private residence Living Arrangements: Spouse/significant other Available Help at Discharge: Family;Available 24 hours/day Type of Home: House Home Access: Level entry     Alternate Level Stairs-Number of Steps: full flightt Home Layout: Two level;Able to live on main level with bedroom/bathroom Home Equipment: Rolling Walker (2 wheels);Grab bars - tub/shower;Cane - single point      Prior Function Prior Level of Function :  Independent/Modified Independent;Driving             Mobility Comments: Community ambulation without AD wearing right knee brace, was using SPC PRN prior to using knee brace ADLs Comments: Independent     Extremity/Trunk Assessment        Lower Extremity Assessment Lower Extremity  Assessment: Overall WFL for tasks assessed    Cervical / Trunk Assessment Cervical / Trunk Assessment: Normal  Communication   Communication Communication: No apparent difficulties  Cognition Arousal: Alert Behavior During Therapy: WFL for tasks assessed/performed Overall Cognitive Status: Within Functional Limits for tasks assessed                                          General Comments      Exercises Total Joint Exercises Ankle Circles/Pumps: Supine, 10 reps, Right, Strengthening, AROM Quad Sets: AROM, Strengthening, Right, 10 reps, Supine Short Arc Quad: AROM, Strengthening, Right, 10 reps, Supine Heel Slides: AROM, Strengthening, 10 reps, Supine, Right Goniometric ROM: Right knee ROM: 0 - 100 degrees   Assessment/Plan    PT Assessment Patient needs continued PT services  PT Problem List Decreased strength;Decreased range of motion;Decreased activity tolerance;Decreased mobility;Decreased balance       PT Treatment Interventions DME instruction;Gait training;Stair training;Therapeutic activities;Functional mobility training;Therapeutic exercise;Balance training;Patient/family education    PT Goals (Current goals can be found in the Care Plan section)  Acute Rehab PT Goals Patient Stated Goal: return home with family to asssit PT Goal Formulation: With patient Time For Goal Achievement: 12/09/23 Potential to Achieve Goals: Good    Frequency BID     Co-evaluation               AM-PAC PT "6 Clicks" Mobility  Outcome Measure Help needed turning from your back to your side while in a flat bed without using bedrails?: None Help needed moving from lying on your back to sitting on the side of a flat bed without using bedrails?: None Help needed moving to and from a bed to a chair (including a wheelchair)?: A Little Help needed standing up from a chair using your arms (e.g., wheelchair or bedside chair)?: None Help needed to walk in hospital  room?: A Little Help needed climbing 3-5 steps with a railing? : A Little 6 Click Score: 21    End of Session   Activity Tolerance: Patient tolerated treatment well;Patient limited by fatigue Patient left: in chair;with call bell/phone within reach Nurse Communication: Mobility status PT Visit Diagnosis: Unsteadiness on feet (R26.81);Other abnormalities of gait and mobility (R26.89);Muscle weakness (generalized) (M62.81)    Time: 7253-6644 PT Time Calculation (min) (ACUTE ONLY): 28 min   Charges:   PT Evaluation $PT Eval Moderate Complexity: 1 Mod PT Treatments $Therapeutic Activity: 23-37 mins PT General Charges $$ ACUTE PT VISIT: 1 Visit        3:29 PM, 12/07/23 Ocie Bob, MPT Physical Therapist with [facility] [phone] office [phone] mobile phone

## 2023-12-07 NOTE — Progress Notes (Signed)
Assisted Dr Johnnette Litter with right, adductor canal block. Side rails up, monitors on throughout procedure. See vital signs in flow sheet. Tolerated Procedure well.

## 2023-12-07 NOTE — Interval H&P Note (Signed)
History and Physical Interval Note:  12/07/2023 7:21 AM  Antonio Colon  has presented today for surgery, with the diagnosis of Right knee osteoarthritis.  The various methods of treatment have been discussed with the patient and family. After consideration of risks, benefits and other options for treatment, the patient has consented to  Procedure(s): TOTAL KNEE ARTHROPLASTY (Right) as a surgical intervention.  The patient's history has been reviewed, patient examined, no change in status, stable for surgery.  I have reviewed the patient's chart and labs.  Questions were answered to the patient's satisfaction.     Fuller [place]

## 2023-12-07 NOTE — TOC Initial Note (Signed)
Transition of Care [facility]) - Initial/Assessment Note    Patient Details  Name: Antonio Colon MRN: [id number] Date of Birth: [birth year]  Transition of Care [facility]) CM/SW Contact:    Beather Arbour Phone Number: 12/07/2023, 3:39 PM  Clinical Narrative:                 PT recommending HH at DC. CSW spoke with Clifton Custard with [facility]. They have been contacted by Dr. Romeo Apple office and are following [facility] orders, placed by office. TOC to follow.    Expected Discharge Plan: Home w Home Health Services Barriers to Discharge: Continued Medical Work up   Patient Goals and CMS Choice Patient states their goals for this hospitalization and ongoing recovery are:: return home CMS Medicare.gov Compare Post Acute Care list provided to:: Patient Choice offered to / list presented to : Patient      Expected Discharge Plan and Services In-house Referral: Clinical Social Work   Post Acute Care Choice: Home Health Living arrangements for the past 2 months: Single Family Home                     HH Agency: [facility] Date [facility] Agency Contacted: 12/07/23 Time HH Agency Contacted: [phone] Representative spoke with at [facility] Agency: Clifton Custard  Prior Living Arrangements/Services Living arrangements for the past 2 months: Single Family Home Lives with:: Spouse Patient language and need for interpreter reviewed:: Yes Do you feel safe going back to the place where you live?: Yes      Need for Family Participation in Patient Care: Yes (Comment)     Criminal Activity/Legal Involvement Pertinent to Current Situation/Hospitalization: No - Comment as needed  Activities of Daily Living   ADL Screening (condition at time of admission) Independently performs ADLs?: Yes (appropriate for developmental age) Is the patient deaf or have difficulty hearing?: No Does the patient have difficulty seeing, even when wearing glasses/contacts?: No Does the patient have difficulty concentrating, remembering, or  making decisions?: No  Permission Sought/Granted   Emotional Assessment   Orientation: : Oriented to Self, Oriented to Place, Oriented to  Time, Oriented to Situation Alcohol / Substance Use: Not Applicable Psych Involvement: No (comment)  Admission diagnosis:  Primary osteoarthritis of right knee [M17.11] Patient Active Problem List   Diagnosis Date Noted   Primary osteoarthritis of right knee 06/22/2023   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 06/22/2023   Hx of melanoma excision 03/01/2023   Chronic diarrhea 04/23/2022   Abdominal pain 04/02/2022   Liver lesion, left lobe 10/30/2021   Diverticulosis of colon without hemorrhage 10/30/2021   Calculus of gallbladder without cholecystitis without obstruction 10/30/2021   Spondylolisthesis of lumbar region 07/10/2020   Obstructive sleep apnea 08/26/2018   Respiratory failure (HCC) 06/01/2018   Morbid obesity (HCC) 06/01/2018   NAFLD (nonalcoholic fatty liver disease) 78/29/5621   GERD (gastroesophageal reflux disease) 01/26/2012   CAD S/P percutaneous coronary angioplasty 01/26/2012   Hyperlipemia 01/26/2012   HTN (hypertension) 01/26/2012   FRACTURE, MEDIAL MALLEOLUS 11/20/2008   Sprain of ankle 11/20/2008   PCP:  Benita Stabile, MD Pharmacy:   [facility] - [place], [place] - [address] [place] [address] Phone: [phone] Fax: [phone]     Social Determinants of Health (SDOH) Social History: SDOH Screenings   Food Insecurity: No Food Insecurity (12/07/2023)  Housing: Low Risk  (12/07/2023)  Transportation Needs: No Transportation Needs (12/07/2023)  Utilities:  Not At Risk (12/07/2023)  Alcohol Screen: Low Risk  (03/01/2023)  Depression (PHQ2-9): Low Risk  (03/01/2023)  Tobacco Use: Medium Risk (12/07/2023)   SDOH Interventions:    Readmission Risk Interventions     No data to display

## 2023-12-07 NOTE — Anesthesia Preprocedure Evaluation (Signed)
Anesthesia Evaluation  Patient identified by MRN, date of birth, ID band Patient awake    Reviewed: Allergy & Precautions, H&P , NPO status , Patient's Chart, lab work & pertinent test results, reviewed documented beta blocker date and time   Airway Mallampati: II  TM Distance: >3 FB Neck ROM: full    Dental no notable dental hx.    Pulmonary neg pulmonary ROS, sleep apnea , pneumonia, former smoker   Pulmonary exam normal breath sounds clear to auscultation       Cardiovascular Exercise Tolerance: Good hypertension, + CAD and + Past MI  negative cardio ROS + Valvular Problems/Murmurs  Rhythm:regular Rate:Normal     Neuro/Psych  Neuromuscular disease negative neurological ROS  negative psych ROS   GI/Hepatic negative GI ROS, Neg liver ROS, hiatal hernia,GERD  ,,  Endo/Other  negative endocrine ROS    Renal/GU negative Renal ROS  negative genitourinary   Musculoskeletal   Abdominal   Peds  Hematology negative hematology ROS (+)   Anesthesia Other Findings   Reproductive/Obstetrics negative OB ROS                             Anesthesia Physical Anesthesia Plan  ASA: 3  Anesthesia Plan: Spinal   Post-op Pain Management:    Induction:   PONV Risk Score and Plan: Propofol infusion  Airway Management Planned:   Additional Equipment:   Intra-op Plan:   Post-operative Plan:   Informed Consent: I have reviewed the patients History and Physical, chart, labs and discussed the procedure including the risks, benefits and alternatives for the proposed anesthesia with the patient or authorized representative who has indicated his/her understanding and acceptance.     Dental Advisory Given  Plan Discussed with: CRNA  Anesthesia Plan Comments:        Anesthesia Quick Evaluation

## 2023-12-08 DIAGNOSIS — M1711 Unilateral primary osteoarthritis, right knee: Secondary | ICD-10-CM | POA: Diagnosis not present

## 2023-12-08 DIAGNOSIS — Z87891 Personal history of nicotine dependence: Secondary | ICD-10-CM | POA: Diagnosis not present

## 2023-12-08 DIAGNOSIS — I2510 Atherosclerotic heart disease of native coronary artery without angina pectoris: Secondary | ICD-10-CM | POA: Diagnosis not present

## 2023-12-08 DIAGNOSIS — I119 Hypertensive heart disease without heart failure: Secondary | ICD-10-CM | POA: Diagnosis not present

## 2023-12-08 LAB — BASIC METABOLIC PANEL
Anion gap: 9 (ref 5–15)
BUN: 18 mg/dL (ref 8–23)
CO2: 22 mmol/L (ref 22–32)
Calcium: 8.7 mg/dL — ABNORMAL LOW (ref 8.9–10.3)
Chloride: 107 mmol/L (ref 98–111)
Creatinine, Ser: 0.7 mg/dL (ref 0.61–1.24)
GFR, Estimated: >60 mL/min (ref >60)
Glucose, Bld: 119 mg/dL — ABNORMAL HIGH (ref 70–99)
Potassium: 3.8 mmol/L (ref 3.5–5.1)
Sodium: 138 mmol/L (ref 135–145)

## 2023-12-08 LAB — CBC
HCT: 44.3 % (ref 39.0–52.0)
Hemoglobin: 14.5 g/dL (ref 13.0–17.0)
MCH: 29.7 pg (ref 26.0–34.0)
MCHC: 32.7 g/dL (ref 30.0–36.0)
MCV: 90.8 fL (ref 80.0–100.0)
Platelets: 258 10*3/uL (ref 150–400)
RBC: 4.88 MIL/uL (ref 4.22–5.81)
RDW: 13.1 % (ref 11.5–15.5)
WBC: 19.8 10*3/uL — ABNORMAL HIGH (ref 4.0–10.5)
nRBC: 0 % (ref 0.0–0.2)

## 2023-12-08 MED ORDER — TRAMADOL HCL 50 MG PO TABS: 50.0000 mg | ORAL_TABLET | Freq: Four times a day (QID) | ORAL | 0 refills | Status: AC

## 2023-12-08 MED ORDER — METHOCARBAMOL 500 MG PO TABS: 500.0000 mg | ORAL_TABLET | Freq: Four times a day (QID) | ORAL | 0 refills | Status: AC | PRN

## 2023-12-08 MED ORDER — HYDROCODONE-ACETAMINOPHEN 10-325 MG PO TABS: 1.0000 | ORAL_TABLET | ORAL | 0 refills | Status: DC | PRN

## 2023-12-08 MED ORDER — ASPIRIN 81 MG PO CHEW: 81.0000 mg | CHEWABLE_TABLET | Freq: Two times a day (BID) | ORAL | 0 refills | Status: AC

## 2023-12-08 MED ORDER — POLYETHYLENE GLYCOL 3350 17 G PO PACK: 17.0000 g | PACK | Freq: Every day | ORAL | 0 refills | Status: AC

## 2023-12-08 MED ORDER — DOCUSATE SODIUM 100 MG PO CAPS: 100.0000 mg | ORAL_CAPSULE | Freq: Two times a day (BID) | ORAL | 0 refills | Status: DC

## 2023-12-08 NOTE — Progress Notes (Signed)
Physical Therapy Treatment Patient Details Name: Antonio Colon MRN: [id number] DOB: 10-13-1951 Today's Date: 12/08/2023   History of Present Illness Antonio Colon is a 72 y/o male s/p Right TKA on 12/07/23, with the diagnosis of Right knee osteoarthritis.    PT Comments  Pt tolerated today's treatment session, well with good carryover for mobility and improved sequencing with transfers and ambulation. Today's session addressed stair training. Pt noted with great carryover when performing lateral step to pattern. Safe, steady stair negotiation and performed at modified independence/supervision level. Pt able to explain back to therapist procedure with stair training prior to attempt. Pt would continue to benefit from skilled acute physical therapy services in order to progress toward POC goals, safety/independence with functional mobility and QOL.     If plan is discharge home, recommend the following: A little help with walking and/or transfers;A little help with bathing/dressing/bathroom;Help with stairs or ramp for entrance;Assistance with cooking/housework   Can travel by private vehicle        Equipment Recommendations  None recommended by PT    Recommendations for Other Services       Precautions / Restrictions Restrictions Weight Bearing Restrictions: Yes RLE Weight Bearing: Weight bearing as tolerated     Mobility  Bed Mobility Overal bed mobility: Modified Independent                  Transfers Overall transfer level: Modified independent                 General transfer comment: sit/stand from EOB, stand to sit to toilet with use of grabbar modified independent    Ambulation/Gait Ambulation/Gait assistance: Modified independent (Device/Increase time) Gait Distance (Feet): 15 Feet Assistive device: Rolling walker (2 wheels) Gait Pattern/deviations: Decreased step length - right, Decreased step length - left, Decreased stance time - right, Antalgic,  Decreased stride length Gait velocity: decreased     General Gait Details: decreased cadence with consitent ffoot length stride when ambulating from EOB to toilet. pt did have on LOB laterally to left, tripping over LE when initiating ambulation but recovered independently. 79ft x 2   Stairs Stairs: Yes Stairs assistance: Modified independent (Device/Increase time), Supervision Stair Management: One rail Right, Step to pattern, Sideways Number of Stairs: 4 General stair comments: Pt educated initially on lateral approach leading with LLE ascending and leading with RLE descending. good return with supervision level when performing stairs.   Wheelchair Mobility     Tilt Bed    Modified Rankin (Stroke Patients Only)       Balance                                            Cognition Arousal: Alert Behavior During Therapy: WFL for tasks assessed/performed Overall Cognitive Status: Within Functional Limits for tasks assessed                                          Exercises      General Comments        Pertinent Vitals/Pain Pain Assessment Pain Score: 3  Pain Location: right knee Pain Descriptors / Indicators: Discomfort, Sore    Home Living  Prior Function            PT Goals (current goals can now be found in the care plan section) Acute Rehab PT Goals Patient Stated Goal: return home with family to asssit PT Goal Formulation: With patient Time For Goal Achievement: 12/09/23 Potential to Achieve Goals: Good Progress towards PT goals: Progressing toward goals    Frequency    BID      PT Plan      Co-evaluation              AM-PAC PT "6 Clicks" Mobility   Outcome Measure  Help needed turning from your back to your side while in a flat bed without using bedrails?: None Help needed moving from lying on your back to sitting on the side of a flat bed without using  bedrails?: None Help needed moving to and from a bed to a chair (including a wheelchair)?: None Help needed standing up from a chair using your arms (e.g., wheelchair or bedside chair)?: None Help needed to walk in hospital room?: None Help needed climbing 3-5 steps with a railing? : A Little 6 Click Score: 23    End of Session Equipment Utilized During Treatment: Gait belt Activity Tolerance: Patient tolerated treatment well;Patient limited by fatigue Patient left: in bed;with call bell/phone within reach Nurse Communication: Mobility status PT Visit Diagnosis: Unsteadiness on feet (R26.81);Other abnormalities of gait and mobility (R26.89);Muscle weakness (generalized) (M62.81)     Time: 4696-2952 PT Time Calculation (min) (ACUTE ONLY): 15 min  Charges:    $Therapeutic Activity: 8-22 mins PT General Charges $$ ACUTE PT VISIT: 1 Visit                     Elie Goody, DPT [facility] [place] [address] [phone] office   Nelida Meuse 12/08/2023, 9:20 AM

## 2023-12-08 NOTE — Anesthesia Postprocedure Evaluation (Signed)
Anesthesia Post Note  Patient: Antonio Colon  Procedure(s) Performed: TOTAL KNEE ARTHROPLASTY (Right: Knee)  Patient location during evaluation: Phase II Anesthesia Type: Spinal Level of consciousness: awake Pain management: pain level controlled Vital Signs Assessment: post-procedure vital signs reviewed and stable Respiratory status: spontaneous breathing and respiratory function stable Cardiovascular status: blood pressure returned to baseline and stable Postop Assessment: no headache and no apparent nausea or vomiting Anesthetic complications: no Comments: Late entry   No notable events documented.   Last Vitals:  Vitals:   12/08/23 0518 12/08/23 0928  BP: 131/63 (!) 106/51  Pulse: 69 80  Resp: 16 19  Temp: 36.5 C 36.6 C  SpO2: 96% 95%    Last Pain:  Vitals:   12/08/23 0928  TempSrc: Oral  PainSc: 3                  Windell Norfolk

## 2023-12-08 NOTE — Progress Notes (Signed)
Patient discharged home with instructions given on medications and follow up visits,verbalized understanding .Prescriptions sent to Pharmacy of choice documented on AVS.IV discontinued,catheter intact. Accompanied by staff to an awaiting vehicle.

## 2023-12-08 NOTE — Care Management Obs Status (Signed)
MEDICARE OBSERVATION STATUS NOTIFICATION   Patient Details  Name: Antonio Colon MRN: [id number] Date of Birth: 07/23/1951   Medicare Observation Status Notification Given:  Yes    [name] 12/08/2023, 11:49 AM

## 2023-12-08 NOTE — Discharge Summary (Signed)
Physician Discharge Summary  Patient ID: Antonio Colon MRN: [id number] DOB/AGE: 12-31-1950 72 y.o.  Admit date: 12/07/2023 Discharge date: 12/08/2023  Admission Diagnoses: Right knee osteoarthritis  Discharge Diagnoses: Right knee osteoarthritis  Discharged Condition: Stable  Procedure: Right knee arthroplasty  Hospital Course:  Hospital day 1 uncomplicated total knee arthroplasty right knee -Participated in physical therapy was able to ambulate greater than 100 FT with 0 to 100 degrees range of motion  Hospital day 2 the patient continued to do well remained afebrile with stable vital signs -Advance in physical therapy tolerating the stairs with good pain control  Implant Name Type Inv. Item Serial No. Manufacturer Lot No. LRB No. Used Action  CEMENT HV SMART SET - [id number] Cement CEMENT HV SMART SET  [employer] [id number] Right 2 Implanted  attune tibial insert    [employer]PU[employer] [id number] Right 1 Implanted  BASE TIBIAL CEM ATTUNE SZ 7 - [id number] Knees BASE TIBIAL CEM ATTUNE SZ 7  [employer]PU[employer] [id number] Right 1 Implanted  ATTUNE MED DOME PAT 38 KNEE - [id number] Knees ATTUNE MED DOME PAT 38 KNEE  [employer]PU[employer] [id number] Right 1 Implanted  ATTUNE PS FEM RT SZ 7 CEM KNEE - [id number] Femur ATTUNE PS FEM RT SZ 7 CEM KNEE  [employer] [id number] Right 1 Implanted    Lab reports:    Latest Ref Rng & Units 12/08/2023    4:04 AM 11/29/2023    2:55 PM 06/21/2023    2:46 PM  CBC  WBC 4.0 - 10.5 K/uL 19.8  9.3  10.6   Hemoglobin 13.0 - 17.0 g/dL 93.7  16.9  67.8   Hematocrit 39.0 - 52.0 % 44.3  47.2  44.8   Platelets 150 - 400 K/uL 258  281  293       Latest Ref Rng & Units 12/08/2023    4:04 AM 11/29/2023    2:55 PM 06/21/2023    2:46 PM  BMP  Glucose 70 - 99 mg/dL 938  101  88   BUN 8 - 23 mg/dL 18  20  21    Creatinine 0.61 - 1.24 mg/dL 7.51  0.25  8.52   Sodium 135 - 145 mmol/L 138  139  138   Potassium 3.5 - 5.1 mmol/L 3.8  3.1  3.3   Chloride 98 -  111 mmol/L 107  107  106   CO2 22 - 32 mmol/L 22  22  22    Calcium 8.9 - 10.3 mg/dL 8.7  9.3  8.9       Discharge Exam: BP (!) 106/51 (BP Location: Left Arm)   Pulse 80   Temp 97.8 F (36.6 C) (Oral)   Resp 19   Ht 5\' 10"  (1.778 m)   Wt 124.3 kg   SpO2 95%   BMI 39.32 kg/m  Physical Exam Vitals and nursing note reviewed.  Constitutional:      General: He is not in acute distress.    Appearance: Normal appearance. He is not ill-appearing, toxic-appearing or diaphoretic.  Cardiovascular:     Rate and Rhythm: Normal rate.     Pulses: Normal pulses.  Musculoskeletal:        General: Swelling and tenderness present. No deformity.     Right lower leg: No edema.     Left lower leg: No edema.     Comments: Scant drainage on the surgical dressing no signs of compartment syndrome or DVT  Skin:    General: Skin is warm and dry.  Capillary Refill: Capillary refill takes less than 2 seconds.  Neurological:     Mental Status: He is alert.     Sensory: No sensory deficit.     Motor: No weakness.     Gait: Gait abnormal.       Disposition: Discharge disposition: 01-Home or Self Care       Discharge Instructions     Call MD / Call 911   Complete by: As directed    If you experience chest pain or shortness of breath, CALL 911 and be transported to the hospital emergency room.  If you develope a fever above 101 F, pus (white drainage) or increased drainage or redness at the wound, or calf pain, call your surgeon's office.   Constipation Prevention   Complete by: As directed    Drink plenty of fluids.  Prune juice may be helpful.  You may use a stool softener, such as Colace (over the counter) 100 mg twice a day.  Use MiraLax (over the counter) for constipation as needed.   Diet - low sodium heart healthy   Complete by: As directed    Discharge instructions   Complete by: As directed    Blue foam pillow placed heel and the pillow 30 minutes 3 times a day  CPM machine  start 0-90 increase 10 degrees/day as tolerated use 4 hours/day  Do all the exercises you were taught in physical therapy  Do not shower for 2 weeks  Do not change the dressing  Wear the White stockings for the next 2 weeks   Increase activity slowly as tolerated   Complete by: As directed    Post-operative opioid taper instructions:   Complete by: As directed    POST-OPERATIVE OPIOID TAPER INSTRUCTIONS: It is important to wean off of your opioid medication as soon as possible. If you do not need pain medication after your surgery it is ok to stop day one. Opioids include: Codeine, Hydrocodone(Norco, Vicodin), Oxycodone(Percocet, oxycontin) and hydromorphone amongst others.  Long term and even short term use of opiods can cause: Increased pain response Dependence Constipation Depression Respiratory depression And more.  Withdrawal symptoms can include Flu like symptoms Nausea, vomiting And more Techniques to manage these symptoms Hydrate well Eat regular healthy meals Stay active Use relaxation techniques(deep breathing, meditating, yoga) Do Not substitute Alcohol to help with tapering If you have been on opioids for less than two weeks and do not have pain than it is ok to stop all together.  Plan to wean off of opioids This plan should start within one week post op of your joint replacement. Maintain the same interval or time between taking each dose and first decrease the dose.  Cut the total daily intake of opioids by one tablet each day Next start to increase the time between doses. The last dose that should be eliminated is the evening dose.         Allergies as of 12/08/2023   No Known Allergies      Medication List     STOP taking these medications    ibuprofen 800 MG tablet Commonly known as: ADVIL   potassium chloride SA 20 MEQ tablet Commonly known as: KLOR-CON M       TAKE these medications    ALPRAZolam 0.25 MG tablet Commonly known as:  XANAX Take 0.25 mg by mouth at bedtime.   aspirin EC 81 MG tablet Take 1 tablet (81 mg total) by mouth daily. What changed: Another medication with  the same name was added. Make sure you understand how and when to take each.   aspirin 81 MG chewable tablet Chew 1 tablet (81 mg total) by mouth 2 (two) times daily. What changed: You were already taking a medication with the same name, and this prescription was added. Make sure you understand how and when to take each.   docusate sodium 100 MG capsule Commonly known as: COLACE Take 1 capsule (100 mg total) by mouth 2 (two) times daily.   HYDROcodone-acetaminophen 10-325 MG tablet Commonly known as: Norco Take 1 tablet by mouth every 4 (four) hours as needed.   losartan-hydrochlorothiazide 100-12.5 MG tablet Commonly known as: HYZAAR Take 1 tablet by mouth daily.   methocarbamol 500 MG tablet Commonly known as: ROBAXIN Take 1 tablet (500 mg total) by mouth every 6 (six) hours as needed for muscle spasms.   multivitamin with minerals Tabs tablet Take 1 tablet by mouth daily.   Myrbetriq 50 MG Tb24 tablet Generic drug: mirabegron ER Take 50 mg by mouth daily.   nebivolol 5 MG tablet Commonly known as: BYSTOLIC TAKE 1 TABLET BY MOUTH ONCE DAILY.   nitroGLYCERIN 0.4 MG SL tablet Commonly known as: NITROSTAT Place 1 tablet (0.4 mg total) under the tongue every 5 (five) minutes as needed. For chest pain   polyethylene glycol 17 g packet Commonly known as: MIRALAX / GLYCOLAX Take 17 g by mouth daily.   rosuvastatin 5 MG tablet Commonly known as: CRESTOR TAKE 1 TABLET BY MOUTH DAILY AT 6PM FOR CHOLESTEROL.   traMADol 50 MG tablet Commonly known as: ULTRAM Take 1 tablet (50 mg total) by mouth every 6 (six) hours.   VITAMIN C PO Take 1 tablet by mouth daily.        Follow-up Information     Vickki Hearing, MD Follow up on 12/27/2023.   Specialties: Orthopedic Surgery, Radiology Why: For wound re-check, For  suture removal Contact information: [address] [place] [address] [phone]                 Signed: Fuller [place] 12/08/2023, 10:42 AM

## 2023-12-09 ENCOUNTER — Telehealth: Payer: Self-pay | Admitting: Orthopedic Surgery

## 2023-12-09 NOTE — Telephone Encounter (Signed)
DR. Romeo Apple   Patient called and he is confused he states that [facility]  at [phone] and he doesn't know if needs to call them back he is not aware of them and no one told him about that facility calling him   Please call him back on his home phone at [phone].

## 2023-12-09 NOTE — Telephone Encounter (Signed)
Maybe [facility]? For home health. He states yes, I advised him it was in his paperwork from last ov with Dr Rexene Edison, he wasn't looking at that was looking at d/c papers from surgery  He has set up appointment

## 2023-12-10 ENCOUNTER — Encounter (HOSPITAL_COMMUNITY): Payer: Self-pay | Admitting: Orthopedic Surgery

## 2023-12-10 DIAGNOSIS — M4316 Spondylolisthesis, lumbar region: Secondary | ICD-10-CM | POA: Diagnosis not present

## 2023-12-10 DIAGNOSIS — I2510 Atherosclerotic heart disease of native coronary artery without angina pectoris: Secondary | ICD-10-CM | POA: Diagnosis not present

## 2023-12-10 DIAGNOSIS — I10 Essential (primary) hypertension: Secondary | ICD-10-CM | POA: Diagnosis not present

## 2023-12-10 DIAGNOSIS — G4733 Obstructive sleep apnea (adult) (pediatric): Secondary | ICD-10-CM | POA: Diagnosis not present

## 2023-12-10 DIAGNOSIS — K219 Gastro-esophageal reflux disease without esophagitis: Secondary | ICD-10-CM | POA: Diagnosis not present

## 2023-12-10 DIAGNOSIS — Z96651 Presence of right artificial knee joint: Secondary | ICD-10-CM | POA: Diagnosis not present

## 2023-12-10 DIAGNOSIS — K760 Fatty (change of) liver, not elsewhere classified: Secondary | ICD-10-CM | POA: Diagnosis not present

## 2023-12-10 DIAGNOSIS — I252 Old myocardial infarction: Secondary | ICD-10-CM | POA: Diagnosis not present

## 2023-12-10 DIAGNOSIS — Z87891 Personal history of nicotine dependence: Secondary | ICD-10-CM | POA: Diagnosis not present

## 2023-12-10 DIAGNOSIS — Z955 Presence of coronary angioplasty implant and graft: Secondary | ICD-10-CM | POA: Diagnosis not present

## 2023-12-10 DIAGNOSIS — Z7982 Long term (current) use of aspirin: Secondary | ICD-10-CM | POA: Diagnosis not present

## 2023-12-10 DIAGNOSIS — E7800 Pure hypercholesterolemia, unspecified: Secondary | ICD-10-CM | POA: Diagnosis not present

## 2023-12-10 DIAGNOSIS — Z471 Aftercare following joint replacement surgery: Secondary | ICD-10-CM | POA: Diagnosis not present

## 2023-12-10 DIAGNOSIS — K449 Diaphragmatic hernia without obstruction or gangrene: Secondary | ICD-10-CM | POA: Diagnosis not present

## 2023-12-13 ENCOUNTER — Telehealth: Payer: Self-pay | Admitting: Orthopedic Surgery

## 2023-12-13 ENCOUNTER — Ambulatory Visit (INDEPENDENT_AMBULATORY_CARE_PROVIDER_SITE_OTHER): Payer: Medicare Other | Admitting: Orthopedic Surgery

## 2023-12-13 DIAGNOSIS — G8929 Other chronic pain: Secondary | ICD-10-CM

## 2023-12-13 DIAGNOSIS — M25561 Pain in right knee: Secondary | ICD-10-CM

## 2023-12-13 DIAGNOSIS — Z96651 Presence of right artificial knee joint: Secondary | ICD-10-CM

## 2023-12-13 DIAGNOSIS — M1711 Unilateral primary osteoarthritis, right knee: Secondary | ICD-10-CM

## 2023-12-13 NOTE — Progress Notes (Signed)
Postop visit #1  Chief Complaint  Patient presents with   Post-op Problem    Patient is here today with drainage from the surgical area on the right knee      Encounter Diagnoses  Name Primary?   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

## 2023-12-13 NOTE — Telephone Encounter (Signed)
Called with verbal orders.  

## 2023-12-13 NOTE — Telephone Encounter (Signed)
Dr. Mort Sawyers pt Antonio Colon PT w/[facility] [phone] lvm requesting verbal orders for this pt for 1w1, 3w1, 2w1

## 2023-12-14 DIAGNOSIS — K760 Fatty (change of) liver, not elsewhere classified: Secondary | ICD-10-CM | POA: Diagnosis not present

## 2023-12-14 DIAGNOSIS — Z7982 Long term (current) use of aspirin: Secondary | ICD-10-CM | POA: Diagnosis not present

## 2023-12-14 DIAGNOSIS — Z87891 Personal history of nicotine dependence: Secondary | ICD-10-CM | POA: Diagnosis not present

## 2023-12-14 DIAGNOSIS — Z955 Presence of coronary angioplasty implant and graft: Secondary | ICD-10-CM | POA: Diagnosis not present

## 2023-12-14 DIAGNOSIS — I10 Essential (primary) hypertension: Secondary | ICD-10-CM | POA: Diagnosis not present

## 2023-12-14 DIAGNOSIS — Z96651 Presence of right artificial knee joint: Secondary | ICD-10-CM | POA: Diagnosis not present

## 2023-12-14 DIAGNOSIS — E7800 Pure hypercholesterolemia, unspecified: Secondary | ICD-10-CM | POA: Diagnosis not present

## 2023-12-14 DIAGNOSIS — G4733 Obstructive sleep apnea (adult) (pediatric): Secondary | ICD-10-CM | POA: Diagnosis not present

## 2023-12-14 DIAGNOSIS — M4316 Spondylolisthesis, lumbar region: Secondary | ICD-10-CM | POA: Diagnosis not present

## 2023-12-14 DIAGNOSIS — I2510 Atherosclerotic heart disease of native coronary artery without angina pectoris: Secondary | ICD-10-CM | POA: Diagnosis not present

## 2023-12-14 DIAGNOSIS — K449 Diaphragmatic hernia without obstruction or gangrene: Secondary | ICD-10-CM | POA: Diagnosis not present

## 2023-12-14 DIAGNOSIS — Z471 Aftercare following joint replacement surgery: Secondary | ICD-10-CM | POA: Diagnosis not present

## 2023-12-14 DIAGNOSIS — I252 Old myocardial infarction: Secondary | ICD-10-CM | POA: Diagnosis not present

## 2023-12-14 DIAGNOSIS — K219 Gastro-esophageal reflux disease without esophagitis: Secondary | ICD-10-CM | POA: Diagnosis not present

## 2023-12-15 DIAGNOSIS — Z96651 Presence of right artificial knee joint: Secondary | ICD-10-CM | POA: Diagnosis not present

## 2023-12-15 DIAGNOSIS — K449 Diaphragmatic hernia without obstruction or gangrene: Secondary | ICD-10-CM | POA: Diagnosis not present

## 2023-12-15 DIAGNOSIS — Z7982 Long term (current) use of aspirin: Secondary | ICD-10-CM | POA: Diagnosis not present

## 2023-12-15 DIAGNOSIS — G4733 Obstructive sleep apnea (adult) (pediatric): Secondary | ICD-10-CM | POA: Diagnosis not present

## 2023-12-15 DIAGNOSIS — K219 Gastro-esophageal reflux disease without esophagitis: Secondary | ICD-10-CM | POA: Diagnosis not present

## 2023-12-15 DIAGNOSIS — I2510 Atherosclerotic heart disease of native coronary artery without angina pectoris: Secondary | ICD-10-CM | POA: Diagnosis not present

## 2023-12-15 DIAGNOSIS — Z471 Aftercare following joint replacement surgery: Secondary | ICD-10-CM | POA: Diagnosis not present

## 2023-12-15 DIAGNOSIS — E7800 Pure hypercholesterolemia, unspecified: Secondary | ICD-10-CM | POA: Diagnosis not present

## 2023-12-15 DIAGNOSIS — Z955 Presence of coronary angioplasty implant and graft: Secondary | ICD-10-CM | POA: Diagnosis not present

## 2023-12-15 DIAGNOSIS — K760 Fatty (change of) liver, not elsewhere classified: Secondary | ICD-10-CM | POA: Diagnosis not present

## 2023-12-15 DIAGNOSIS — M4316 Spondylolisthesis, lumbar region: Secondary | ICD-10-CM | POA: Diagnosis not present

## 2023-12-15 DIAGNOSIS — I252 Old myocardial infarction: Secondary | ICD-10-CM | POA: Diagnosis not present

## 2023-12-15 DIAGNOSIS — Z87891 Personal history of nicotine dependence: Secondary | ICD-10-CM | POA: Diagnosis not present

## 2023-12-15 DIAGNOSIS — I10 Essential (primary) hypertension: Secondary | ICD-10-CM | POA: Diagnosis not present

## 2023-12-16 ENCOUNTER — Ambulatory Visit: Payer: Medicare Other | Admitting: Orthopedic Surgery

## 2023-12-16 ENCOUNTER — Encounter: Payer: Self-pay | Admitting: Orthopedic Surgery

## 2023-12-16 DIAGNOSIS — Z96651 Presence of right artificial knee joint: Secondary | ICD-10-CM

## 2023-12-16 NOTE — Progress Notes (Signed)
Postop visit unscheduled  Chief Complaint  Patient presents with   Post-op Follow-up    Bloody drainage 12/07/23     Knee replacement 12/07/2023  Serosanguineous drainage through the incision.  He is liquefying a hematoma that formed postop.  No signs of infection  Sutures/staples removed next week continue TED hose

## 2023-12-17 DIAGNOSIS — K219 Gastro-esophageal reflux disease without esophagitis: Secondary | ICD-10-CM | POA: Diagnosis not present

## 2023-12-17 DIAGNOSIS — I10 Essential (primary) hypertension: Secondary | ICD-10-CM | POA: Diagnosis not present

## 2023-12-17 DIAGNOSIS — I252 Old myocardial infarction: Secondary | ICD-10-CM | POA: Diagnosis not present

## 2023-12-17 DIAGNOSIS — K760 Fatty (change of) liver, not elsewhere classified: Secondary | ICD-10-CM | POA: Diagnosis not present

## 2023-12-17 DIAGNOSIS — Z7982 Long term (current) use of aspirin: Secondary | ICD-10-CM | POA: Diagnosis not present

## 2023-12-17 DIAGNOSIS — M4316 Spondylolisthesis, lumbar region: Secondary | ICD-10-CM | POA: Diagnosis not present

## 2023-12-17 DIAGNOSIS — K449 Diaphragmatic hernia without obstruction or gangrene: Secondary | ICD-10-CM | POA: Diagnosis not present

## 2023-12-17 DIAGNOSIS — Z96651 Presence of right artificial knee joint: Secondary | ICD-10-CM | POA: Diagnosis not present

## 2023-12-17 DIAGNOSIS — I2510 Atherosclerotic heart disease of native coronary artery without angina pectoris: Secondary | ICD-10-CM | POA: Diagnosis not present

## 2023-12-17 DIAGNOSIS — Z955 Presence of coronary angioplasty implant and graft: Secondary | ICD-10-CM | POA: Diagnosis not present

## 2023-12-17 DIAGNOSIS — G4733 Obstructive sleep apnea (adult) (pediatric): Secondary | ICD-10-CM | POA: Diagnosis not present

## 2023-12-17 DIAGNOSIS — Z471 Aftercare following joint replacement surgery: Secondary | ICD-10-CM | POA: Diagnosis not present

## 2023-12-17 DIAGNOSIS — Z87891 Personal history of nicotine dependence: Secondary | ICD-10-CM | POA: Diagnosis not present

## 2023-12-17 DIAGNOSIS — E7800 Pure hypercholesterolemia, unspecified: Secondary | ICD-10-CM | POA: Diagnosis not present

## 2023-12-20 DIAGNOSIS — Z96651 Presence of right artificial knee joint: Secondary | ICD-10-CM | POA: Diagnosis not present

## 2023-12-20 DIAGNOSIS — I2510 Atherosclerotic heart disease of native coronary artery without angina pectoris: Secondary | ICD-10-CM | POA: Diagnosis not present

## 2023-12-20 DIAGNOSIS — Z955 Presence of coronary angioplasty implant and graft: Secondary | ICD-10-CM | POA: Diagnosis not present

## 2023-12-20 DIAGNOSIS — G4733 Obstructive sleep apnea (adult) (pediatric): Secondary | ICD-10-CM | POA: Diagnosis not present

## 2023-12-20 DIAGNOSIS — K219 Gastro-esophageal reflux disease without esophagitis: Secondary | ICD-10-CM | POA: Diagnosis not present

## 2023-12-20 DIAGNOSIS — Z87891 Personal history of nicotine dependence: Secondary | ICD-10-CM | POA: Diagnosis not present

## 2023-12-20 DIAGNOSIS — E7800 Pure hypercholesterolemia, unspecified: Secondary | ICD-10-CM | POA: Diagnosis not present

## 2023-12-20 DIAGNOSIS — I10 Essential (primary) hypertension: Secondary | ICD-10-CM | POA: Diagnosis not present

## 2023-12-20 DIAGNOSIS — K449 Diaphragmatic hernia without obstruction or gangrene: Secondary | ICD-10-CM | POA: Diagnosis not present

## 2023-12-20 DIAGNOSIS — K760 Fatty (change of) liver, not elsewhere classified: Secondary | ICD-10-CM | POA: Diagnosis not present

## 2023-12-20 DIAGNOSIS — Z471 Aftercare following joint replacement surgery: Secondary | ICD-10-CM | POA: Diagnosis not present

## 2023-12-20 DIAGNOSIS — M4316 Spondylolisthesis, lumbar region: Secondary | ICD-10-CM | POA: Diagnosis not present

## 2023-12-20 DIAGNOSIS — Z7982 Long term (current) use of aspirin: Secondary | ICD-10-CM | POA: Diagnosis not present

## 2023-12-20 DIAGNOSIS — I252 Old myocardial infarction: Secondary | ICD-10-CM | POA: Diagnosis not present

## 2023-12-23 ENCOUNTER — Ambulatory Visit: Payer: Medicare Other | Admitting: Radiology

## 2023-12-23 ENCOUNTER — Ambulatory Visit (HOSPITAL_COMMUNITY): Payer: Medicare Other

## 2023-12-23 DIAGNOSIS — Z96651 Presence of right artificial knee joint: Secondary | ICD-10-CM

## 2023-12-23 NOTE — Progress Notes (Signed)
Staples removed without difficulty Some serous drainage from distal incision / from staple removal area but incision appears to be well closed Bandaged advised patient keep bandaged, except for shower, soap and water only return to clinic on Monday to see Dr Romeo Apple for follow up since some minimal drainage continues.

## 2023-12-24 DIAGNOSIS — Z96651 Presence of right artificial knee joint: Secondary | ICD-10-CM | POA: Diagnosis not present

## 2023-12-24 DIAGNOSIS — K219 Gastro-esophageal reflux disease without esophagitis: Secondary | ICD-10-CM | POA: Diagnosis not present

## 2023-12-24 DIAGNOSIS — K449 Diaphragmatic hernia without obstruction or gangrene: Secondary | ICD-10-CM | POA: Diagnosis not present

## 2023-12-24 DIAGNOSIS — E7800 Pure hypercholesterolemia, unspecified: Secondary | ICD-10-CM | POA: Diagnosis not present

## 2023-12-24 DIAGNOSIS — G4733 Obstructive sleep apnea (adult) (pediatric): Secondary | ICD-10-CM | POA: Diagnosis not present

## 2023-12-24 DIAGNOSIS — I252 Old myocardial infarction: Secondary | ICD-10-CM | POA: Diagnosis not present

## 2023-12-24 DIAGNOSIS — K760 Fatty (change of) liver, not elsewhere classified: Secondary | ICD-10-CM | POA: Diagnosis not present

## 2023-12-24 DIAGNOSIS — Z955 Presence of coronary angioplasty implant and graft: Secondary | ICD-10-CM | POA: Diagnosis not present

## 2023-12-24 DIAGNOSIS — Z471 Aftercare following joint replacement surgery: Secondary | ICD-10-CM | POA: Diagnosis not present

## 2023-12-24 DIAGNOSIS — Z87891 Personal history of nicotine dependence: Secondary | ICD-10-CM | POA: Diagnosis not present

## 2023-12-24 DIAGNOSIS — Z7982 Long term (current) use of aspirin: Secondary | ICD-10-CM | POA: Diagnosis not present

## 2023-12-24 DIAGNOSIS — I2510 Atherosclerotic heart disease of native coronary artery without angina pectoris: Secondary | ICD-10-CM | POA: Diagnosis not present

## 2023-12-24 DIAGNOSIS — I10 Essential (primary) hypertension: Secondary | ICD-10-CM | POA: Diagnosis not present

## 2023-12-24 DIAGNOSIS — M4316 Spondylolisthesis, lumbar region: Secondary | ICD-10-CM | POA: Diagnosis not present

## 2023-12-27 ENCOUNTER — Ambulatory Visit (HOSPITAL_COMMUNITY): Payer: Medicare Other

## 2023-12-27 ENCOUNTER — Ambulatory Visit (INDEPENDENT_AMBULATORY_CARE_PROVIDER_SITE_OTHER): Payer: Medicare Other | Admitting: Orthopedic Surgery

## 2023-12-27 DIAGNOSIS — Z96651 Presence of right artificial knee joint: Secondary | ICD-10-CM

## 2023-12-27 MED ORDER — HYDROCODONE-ACETAMINOPHEN 10-325 MG PO TABS: 1.0000 | ORAL_TABLET | ORAL | 0 refills | Status: AC | PRN

## 2023-12-27 NOTE — Progress Notes (Signed)
POST OP   Chief Complaint  Patient presents with   Follow-up    RT TKA 12/07/23   3 weeks postop after right total knee doing well.  Patient walking independently good quadriceps control.  His wound is under control now with no drainage  His knee flexion is 95 degrees he has full extension he is walking with no cane  Meds ordered this encounter  Medications   HYDROcodone-acetaminophen (NORCO) 10-325 MG tablet    Sig: Take 1 tablet by mouth every 4 (four) hours as needed for up to 5 days.    Dispense:  30 tablet    Refill:  0    He is down to 1 hydrocodone a day maybe 2  You will continue therapy follow-up in 3 weeks

## 2023-12-27 NOTE — Therapy (Signed)
 OUTPATIENT PHYSICAL THERAPY LOWER EXTREMITY EVALUATION   Patient Name: Antonio Colon MRN: [id number] DOB:18-Dec-1951, 72 y.o., male Today's Date: 12/30/2023  END OF SESSION:  PT End of Session - 12/30/23 1259     Visit Number 1    Number of Visits 12    Date for PT Re-Evaluation 02/10/24    Authorization Type [employer]    PT Start Time 1300    PT Stop Time 1340    PT Time Calculation (min) 40 min    Activity Tolerance Patient tolerated treatment well    Behavior During Therapy [facility] for tasks assessed/performed             Past Medical History:  Diagnosis Date   CAD (coronary artery disease)    stents   Fatty liver    Fracture, rib 05/2021   right side   GERD (gastroesophageal reflux disease)    Heart disease    Heart murmur    childhood   Hiatal hernia    History of stress test 01/27/2012   Normal study. No significant ischemia demonstration, this low risk scan, no significant change compared to previous study.   Hypercholesteremia    Hyperlipidemia    Hypertension    Myocardial infarction (HCC) 2010   Obesity    Pneumonia 2019   Sleep apnea    cpap at night   Past Surgical History:  Procedure Laterality Date   AXILLARY LYMPH NODE DISSECTION Right 07/2021   BACK SURGERY  10/2019   Dr. Gillie: right L4-5 laminectomy   BIOPSY  06/02/2022   Procedure: BIOPSY;  Surgeon: Eartha Flavors, Toribio, MD;  [facility];  Service: Gastroenterology;;   CARDIAC CATHETERIZATION  10/2009   CAD stents to the LAD using an Endeavor drug eluting (3x72mm) by Dr Morgan, he had normal circumflex and RCA at the time.   COLONOSCOPY  06/15/2012   COLONOSCOPY N/A 01/09/2019   Procedure: COLONOSCOPY;  Surgeon: Golda Claudis PENNER, MD;  [facility];  Service: Endoscopy;  Laterality: N/A;  2:10pm   COLONOSCOPY WITH PROPOFOL  N/A 06/02/2022   Procedure: COLONOSCOPY WITH PROPOFOL ;  Surgeon: Eartha Flavors Toribio, MD;  [facility];  Service: Gastroenterology;   Laterality: N/A;  730 ASA 2   ESOPHAGOGASTRODUODENOSCOPY N/A 11/15/2015   Procedure: ESOPHAGOGASTRODUODENOSCOPY (EGD);  Surgeon: Claudis PENNER Golda, MD;  [facility];  Service: Endoscopy;  Laterality: N/A;  1:10 - moved to 12:55 - Ann notified pt   KNEE ARTHROSCOPY WITH MEDIAL MENISECTOMY Right 06/22/2023   Procedure: KNEE ARTHROSCOPY WITH MEDIAL AND LATERAL MENISCECTOMY;  Surgeon: Margrette Taft BRAVO, MD;  [facility]: [facility];  Service: Orthopedics;  Laterality: Right;   MELANOMA EXCISION  07/2021   back   POLYPECTOMY  01/09/2019   Procedure: POLYPECTOMY;  Surgeon: Golda Claudis PENNER, MD;  [facility];  Service: Endoscopy;;  colon   POLYPECTOMY  06/02/2022   Procedure: POLYPECTOMY;  Surgeon: Eartha Flavors Toribio, MD;  [facility];  Service: Gastroenterology;;   TONSILLECTOMY     TOTAL KNEE ARTHROPLASTY Right 12/07/2023   Procedure: TOTAL KNEE ARTHROPLASTY;  Surgeon: Margrette Taft BRAVO, MD;  [facility]: [facility];  Service: Orthopedics;  Laterality: Right;   Patient Active Problem List   Diagnosis Date Noted   S/P total knee arthroplasty, right 12/07/23 12/13/2023   Primary osteoarthritis of right knee 06/22/2023   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 06/22/2023   Hx of melanoma excision 03/01/2023   Chronic  diarrhea 04/23/2022   Abdominal pain 04/02/2022   Liver lesion, left lobe 10/30/2021   Diverticulosis of colon without hemorrhage 10/30/2021   Calculus of gallbladder without cholecystitis without obstruction 10/30/2021   Spondylolisthesis of lumbar region 07/10/2020   Obstructive sleep apnea 08/26/2018   Respiratory failure (HCC) 06/01/2018   Morbid obesity (HCC) 06/01/2018   NAFLD (nonalcoholic fatty liver disease) 98/70/7986   GERD (gastroesophageal reflux disease) 01/26/2012   CAD S/P percutaneous coronary angioplasty 01/26/2012   Hyperlipemia 01/26/2012   HTN (hypertension) 01/26/2012   FRACTURE, MEDIAL MALLEOLUS  11/20/2008   Sprain of ankle 11/20/2008    PCP: Norleen Hurst  REFERRING PROVIDER: Taft Minerva   REFERRING DIAG:  Diagnosis  M17.11 (ICD-10-CM) - Unilateral primary osteoarthritis, right knee    THERAPY DIAG:  Right knee pain, edema, muscle weakness,   Rationale for Evaluation and Treatment: Rehabilitation  ONSET DATE: 12/07/23  SUBJECTIVE:   SUBJECTIVE STATEMENT: PT states that he has been getting HH which ended last Friday.  The pt states that he can sit for an hour to an hour and a half,  stand for 30 minutes, walking for 30 minutes.  PT states that he is still sleeping in the recliner.    PERTINENT HISTORY: OA PAIN:  Are you having pain? Yes: NPRS scale: 0 ; worst pain 3/10 Pain location: inside  Pain description: aching, throb  Aggravating factors: activity  Relieving factors: ice and meds   PRECAUTIONS: Fall   WEIGHT BEARING RESTRICTIONS: No  FALLS:  Has patient fallen in last 6 months? No  LIVING ENVIRONMENT: Lives with: lives with their family Lives in: House/apartment Stairs: Yes: Internal: 18 steps; on right going up; does not complete reciprocal yet  Has following equipment at home: Single point cane  OCCUPATION: retired part time with city; riding in a truck handling complaints   PLOF: Independent  PATIENT GOALS: decreased pain Improved mobility   NEXT MD VISIT: 01/20/24  OBJECTIVE:  Note: Objective measures were completed at Evaluation unless otherwise noted.   PATIENT SURVEYS:  FOTO 57  COGNITION: Overall cognitive status: Within functional limits for tasks assessed     SENSATION: Not tested  LOWER EXTREMITY ROM:  Active ROM Right eval Left eval  Hip flexion    Hip extension    Hip abduction    Hip adduction    Hip internal rotation    Hip external rotation    Knee flexion 105   Knee extension 14   Ankle dorsiflexion    Ankle plantarflexion    Ankle inversion    Ankle eversion     (Blank rows = not tested)  LOWER  EXTREMITY MMT:  MMT Right eval Left eval  Hip flexion 4+ 5  Hip extension 3 3  Hip abduction 4 5  Hip adduction    Hip internal rotation    Hip external rotation    Knee flexion 3+ 5  Knee extension 3 5  Ankle dorsiflexion 5 5  Ankle plantarflexion    Ankle inversion    Ankle eversion     (Blank rows = not tested) FUNCTIONAL TESTS:  30 seconds chair stand test:  11 below average; 13 is average  2 minute walk test: 337 ft no assistive device with significant limp Single leg stance:  Rt:1  , Lt:  1  TREATMENT DATE: 12/30/2023 Eval:  Quad set x 5 Heel slide x 5 Sit to stand x 10   PATIENT EDUCATION:  Education details: HEP Person educated: Patient Education method: Explanation, Verbal cues, and Handouts Education comprehension: returned demonstration  HOME EXERCISE PROGRAM: Access Code: [email or web address] URL: [email or web address] Date: 12/30/2023 Prepared by: Montie Metro  Exercises - Seated Long Arc Quad  - 3 x daily - 7 x weekly - 1 sets - 10 reps - 5 hold - Quad Setting and Stretching  - 3 x daily - 7 x weekly - 1 sets - 10 reps - 5 hold - Supine Heel Slides  - 2 x daily - 7 x weekly - 1 sets - 10 reps - 5 hold - Heel Raises with Counter Support  - 2 x daily - 7 x weekly - 1 sets - 10 reps - 3-5 hold - Sit to Stand  - 3 x daily - 7 x weekly - 1 sets - 10 reps - 3 hold  ASSESSMENT:  CLINICAL IMPRESSION: Patient is a 72 y.o. male  who was seen today for physical therapy evaluation and treatment for evaluation and treatment for a right total knee which was performed on 12/07/23.  At this time Mr. Aikey has decreased ROM, decreased activity tolerance increased, decreased strength, decreased balance, increased swelling, increased pain and difficulty walking.  Mr. Scribner will benefit from skilled PT to address these issues and maximize  his functional ability. .   OBJECTIVE IMPAIRMENTS: decreased activity tolerance, decreased balance, difficulty walking, decreased ROM, decreased strength, increased edema, and pain.   ACTIVITY LIMITATIONS: carrying, lifting, sitting, standing, squatting, stairs, toileting, and locomotion level  PARTICIPATION LIMITATIONS: meal prep, driving, shopping, community activity, and yard work  PERSONAL FACTORS: Fitness are also affecting patient's functional outcome.   REHAB POTENTIAL: Good  CLINICAL DECISION MAKING: Stable/uncomplicated  EVALUATION COMPLEXITY: Moderate   GOALS: Goals reviewed with patient? No  SHORT TERM GOALS: Target date: 01/20/24 PT to be I in HEP in order to decrease his pain to no greater than a 1/10 Baseline: Goal status: INITIAL  2.  Pt ROM to be improved to 5-115 to allow pt to ambulate without a limp  Baseline:  Goal status: INITIAL  3.  PT strength to be increased by 1/2 grade to allow pt to go up and down 9 steps in a reciprocal manner.  Baseline:  Goal status: INITIAL  LONG TERM GOALS: Target date: 02/10/24  Pt ROM to be improved to 3-120 to allow pt to squat down to pick items off the floor, sit for over 2 hrs in comfort.  Baseline:  Goal status: INITIAL  2.  PT strength to be increased by 1 grade to allow pt to go up and down 18 steps in a reciprocal manner, return from a squatted position  Baseline:  Goal status: INITIAL  3.  Pt to be able to single leg stance for 10 seconds on both LE to reduce risk of falls.  Baseline:  Goal status: INITIAL    PLAN:  PT FREQUENCY: 3x/week  PT DURATION: 6 weeks  PLANNED INTERVENTIONS: 97110-Therapeutic exercises, 97530- Therapeutic activity, 97112- Neuromuscular re-education, 97535- Self Care, [id number]- Manual therapy, [phone]- Gait training, and Patient/Family education  PLAN FOR NEXT SESSION: continue to see pt to progress with ROM and strength manual if needed for pain and to increase ROM   Montie Metro, PT CLT [phone]  12/30/2023, 1:46 PM  [facility] Medicare Auth Request Information  Date of referral: 12/05/24 Referring provider:  Dr. Taft Minerva  Referring diagnosis (ICD 10)?  Diagnosis  M17.11 (ICD-10-CM) - Unilateral primary osteoarthritis, right knee   Treatment diagnosis (ICD 10)? (if different than referring diagnosis) M62.81; M25.661, M25.561  Functional Tool Score: foto 40  What was this (referring dx) caused by? Arthritis  Nature of Condition: Chronic (continuous duration > 3 months)   Laterality: Rt  Current Functional Measure Score: FOTO 57  Objective measurements identify impairments when they are compared to normal values, the uninvolved extremity, and prior level of function.  [x]  Yes  []  No  Objective assessment of functional ability: Moderate functional limitations   Briefly describe symptoms: limited ROM, strength, endurance and balance   How did symptoms start: OA  Average pain intensity:  Last 24 hours: 2  Past week: 2  How often does the pt experience symptoms? Frequently stiffness   How much have the symptoms interfered with usual daily activities? Moderately  How has condition changed since care began at this facility? NA - initial visit  In general, how is the patients overall health? Fair

## 2023-12-30 ENCOUNTER — Encounter (HOSPITAL_COMMUNITY): Payer: Medicare Other

## 2023-12-30 ENCOUNTER — Other Ambulatory Visit: Payer: Self-pay

## 2023-12-30 ENCOUNTER — Ambulatory Visit (HOSPITAL_COMMUNITY): Payer: Medicare Other | Attending: Orthopedic Surgery | Admitting: Physical Therapy

## 2023-12-30 DIAGNOSIS — M25661 Stiffness of right knee, not elsewhere classified: Secondary | ICD-10-CM | POA: Insufficient documentation

## 2023-12-30 DIAGNOSIS — M6281 Muscle weakness (generalized): Secondary | ICD-10-CM | POA: Diagnosis not present

## 2023-12-30 DIAGNOSIS — M1711 Unilateral primary osteoarthritis, right knee: Secondary | ICD-10-CM | POA: Diagnosis not present

## 2023-12-30 DIAGNOSIS — M25561 Pain in right knee: Secondary | ICD-10-CM | POA: Diagnosis not present

## 2023-12-30 DIAGNOSIS — H43393 Other vitreous opacities, bilateral: Secondary | ICD-10-CM | POA: Diagnosis not present

## 2024-01-03 ENCOUNTER — Ambulatory Visit: Payer: Medicare Other | Admitting: Cardiovascular Disease

## 2024-01-03 ENCOUNTER — Encounter (HOSPITAL_COMMUNITY): Payer: Medicare Other

## 2024-01-05 ENCOUNTER — Encounter (HOSPITAL_COMMUNITY): Payer: Self-pay | Admitting: Nurse Practitioner

## 2024-01-05 ENCOUNTER — Encounter (HOSPITAL_COMMUNITY): Payer: Self-pay

## 2024-01-05 ENCOUNTER — Ambulatory Visit (HOSPITAL_COMMUNITY): Payer: Medicare Other

## 2024-01-05 DIAGNOSIS — M25561 Pain in right knee: Secondary | ICD-10-CM

## 2024-01-05 DIAGNOSIS — M25661 Stiffness of right knee, not elsewhere classified: Secondary | ICD-10-CM

## 2024-01-05 DIAGNOSIS — M6281 Muscle weakness (generalized): Secondary | ICD-10-CM

## 2024-01-05 DIAGNOSIS — M1711 Unilateral primary osteoarthritis, right knee: Secondary | ICD-10-CM | POA: Diagnosis not present

## 2024-01-05 NOTE — Therapy (Signed)
 OUTPATIENT PHYSICAL THERAPY LOWER EXTREMITY TREATMENT   Patient Name: Antonio Colon MRN: [id number] DOB:01-22-1951, 73 y.o., male Today's Date: 01/05/2024  END OF SESSION:  PT End of Session - 01/05/24 1103     Visit Number 2    Number of Visits 12    Date for PT Re-Evaluation 02/10/24    Authorization Type U[employer]    PT Start Time 1103    PT Stop Time 1146    PT Time Calculation (min) 43 min    Activity Tolerance Patient tolerated treatment well    Behavior During Therapy [facility] for tasks assessed/performed             Past Medical History:  Diagnosis Date   CAD (coronary artery disease)    stents   Fatty liver    Fracture, rib 05/2021   right side   GERD (gastroesophageal reflux disease)    Heart disease    Heart murmur    childhood   Hiatal hernia    History of stress test 01/27/2012   Normal study. No significant ischemia demonstration, this low risk scan, no significant change compared to previous study.   Hypercholesteremia    Hyperlipidemia    Hypertension    Myocardial infarction (HCC) 2010   Obesity    Pneumonia 2019   Sleep apnea    cpap at night   Past Surgical History:  Procedure Laterality Date   AXILLARY LYMPH NODE DISSECTION Right 07/2021   BACK SURGERY  10/2019   Dr. Gillie: right L4-5 laminectomy   BIOPSY  06/02/2022   Procedure: BIOPSY;  Surgeon: Eartha Flavors, Toribio, MD;  [facility];  Service: Gastroenterology;;   CARDIAC CATHETERIZATION  10/2009   CAD stents to the LAD using an Endeavor drug eluting (3x83mm) by Dr Morgan, he had normal circumflex and RCA at the time.   COLONOSCOPY  06/15/2012   COLONOSCOPY N/A 01/09/2019   Procedure: COLONOSCOPY;  Surgeon: Golda Claudis PENNER, MD;  [facility];  Service: Endoscopy;  Laterality: N/A;  2:10pm   COLONOSCOPY WITH PROPOFOL  N/A 06/02/2022   Procedure: COLONOSCOPY WITH PROPOFOL ;  Surgeon: Eartha Flavors Toribio, MD;  [facility];  Service: Gastroenterology;   Laterality: N/A;  730 ASA 2   ESOPHAGOGASTRODUODENOSCOPY N/A 11/15/2015   Procedure: ESOPHAGOGASTRODUODENOSCOPY (EGD);  Surgeon: Claudis PENNER Golda, MD;  [facility];  Service: Endoscopy;  Laterality: N/A;  1:10 - moved to 12:55 - Ann notified pt   KNEE ARTHROSCOPY WITH MEDIAL MENISECTOMY Right 06/22/2023   Procedure: KNEE ARTHROSCOPY WITH MEDIAL AND LATERAL MENISCECTOMY;  Surgeon: Margrette Taft BRAVO, MD;  [facility];  Service: Orthopedics;  Laterality: Right;   MELANOMA EXCISION  07/2021   back   POLYPECTOMY  01/09/2019   Procedure: POLYPECTOMY;  Surgeon: Golda Claudis PENNER, MD;  [facility];  Service: Endoscopy;;  colon   POLYPECTOMY  06/02/2022   Procedure: POLYPECTOMY;  Surgeon: Eartha Flavors Toribio, MD;  [facility];  Service: Gastroenterology;;   TONSILLECTOMY     TOTAL KNEE ARTHROPLASTY Right 12/07/2023   Procedure: TOTAL KNEE ARTHROPLASTY;  Surgeon: Margrette Taft BRAVO, MD;  [facility]: [facility];  Service: Orthopedics;  Laterality: Right;   Patient Active Problem List   Diagnosis Date Noted   S/P total knee arthroplasty, right 12/07/23 12/13/2023   Primary osteoarthritis of right knee 06/22/2023   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 06/22/2023   Hx of melanoma excision 03/01/2023   Chronic  diarrhea 04/23/2022   Abdominal pain 04/02/2022   Liver lesion, left lobe 10/30/2021   Diverticulosis of colon without hemorrhage 10/30/2021   Calculus of gallbladder without cholecystitis without obstruction 10/30/2021   Spondylolisthesis of lumbar region 07/10/2020   Obstructive sleep apnea 08/26/2018   Respiratory failure (HCC) 06/01/2018   Morbid obesity (HCC) 06/01/2018   NAFLD (nonalcoholic fatty liver disease) 98/70/7986   GERD (gastroesophageal reflux disease) 01/26/2012   CAD S/P percutaneous coronary angioplasty 01/26/2012   Hyperlipemia 01/26/2012   HTN (hypertension) 01/26/2012   FRACTURE, MEDIAL MALLEOLUS  11/20/2008   Sprain of ankle 11/20/2008    PCP: Norleen Hurst  REFERRING PROVIDER: Taft Minerva   REFERRING DIAG:  Diagnosis  M17.11 (ICD-10-CM) - Unilateral primary osteoarthritis, right knee    THERAPY DIAG:  Right knee pain, edema, muscle weakness,   Rationale for Evaluation and Treatment: Rehabilitation  ONSET DATE: 12/07/23  SUBJECTIVE:   SUBJECTIVE STATEMENT: 01/05/24:  Reports knee is stiff today, no reports of pain.  Has been compliant with HEP without questions.    Eval:  PT states that he has been getting HH which ended last Friday.  The pt states that he can sit for an hour to an hour and a half,  stand for 30 minutes, walking for 30 minutes.  PT states that he is still sleeping in the recliner.    PERTINENT HISTORY: OA PAIN:  Are you having pain? Yes: NPRS scale: 0 ; worst pain 3/10 Pain location: inside  Pain description: aching, throb  Aggravating factors: activity  Relieving factors: ice and meds   PRECAUTIONS: Fall   WEIGHT BEARING RESTRICTIONS: No  FALLS:  Has patient fallen in last 6 months? No  LIVING ENVIRONMENT: Lives with: lives with their family Lives in: House/apartment Stairs: Yes: Internal: 18 steps; on right going up; does not complete reciprocal yet  Has following equipment at home: Single point cane  OCCUPATION: retired part time with city; riding in a truck handling complaints   PLOF: Independent  PATIENT GOALS: decreased pain Improved mobility   NEXT MD VISIT: 01/20/24  OBJECTIVE:  Note: Objective measures were completed at Evaluation unless otherwise noted.   PATIENT SURVEYS:  FOTO 57  COGNITION: Overall cognitive status: Within functional limits for tasks assessed     SENSATION: Not tested  LOWER EXTREMITY ROM:  Active ROM Right eval Left eval Right  01/05/24  Hip flexion     Hip extension     Hip abduction     Hip adduction     Hip internal rotation     Hip external rotation     Knee flexion 105  115   Knee extension 14  8  Ankle dorsiflexion     Ankle plantarflexion     Ankle inversion     Ankle eversion      (Blank rows = not tested)  LOWER EXTREMITY MMT:  MMT Right eval Left eval  Hip flexion 4+ 5  Hip extension 3 3  Hip abduction 4 5  Hip adduction    Hip internal rotation    Hip external rotation    Knee flexion 3+ 5  Knee extension 3 5  Ankle dorsiflexion 5 5  Ankle plantarflexion    Ankle inversion    Ankle eversion     (Blank rows = not tested) FUNCTIONAL TESTS:  30 seconds chair stand test:  11 below average; 13 is average  2 minute walk test: 337 ft no assistive device with significant limp Single leg stance:  Rt:1  , Lt:  1                                                                                                                                TREATMENT DATE:  01/05/24: Reviewed goals Educated importance of HEP compliance for maximal benefits  Supine: Quad sets 10x 5 SAQ 10x5 SLR 10x 2 sets with quad set prior Hamstring stretch 3x 30 Bridge 10x 5  Prone knee hang 4' with manual to hamstrings  AROM 8-115  Standing: TKE with GTB 10x 5 Slant board 2x 30  12/30/2023 Eval:  Quad set x 5 Heel slide x 5 Sit to stand x 10   PATIENT EDUCATION:  Education details: HEP Person educated: Patient Education method: Programmer, Multimedia, Verbal cues, and Handouts Education comprehension: returned demonstration  HOME EXERCISE PROGRAM: Access Code: [email or web address] URL: [email or web address] Date: 12/30/2023 Prepared by: Montie Metro  Exercises - Seated Long Arc Quad  - 3 x daily - 7 x weekly - 1 sets - 10 reps - 5 hold - Quad Setting and Stretching  - 3 x daily - 7 x weekly - 1 sets - 10 reps - 5 hold - Supine Heel Slides  - 2 x daily - 7 x weekly - 1 sets - 10 reps - 5 hold - Heel Raises with Counter Support  - 2 x daily - 7 x weekly - 1 sets - 10 reps - 3-5 hold - Sit to Stand  - 3 x daily - 7 x weekly - 1 sets - 10 reps - 3  hold  01/05/24: - Hooklying Hamstring Stretch with Strap  - 2 x daily - 7 x weekly - 3 sets - 3 reps - 30 hold - Prone Knee Extension Hang  - 2 x daily - 7 x weekly - 1 sets - 1 reps - 5' hold - Supine Bridge  - 2 x daily - 7 x weekly - 2 sets - 10 reps - 5 hold  ASSESSMENT:  CLINICAL IMPRESSION: 01/05/24:  Reviewed goals and educated importance of HEP compliance for maximal benefits, pt able to recall and demonstrate appropriate mechanics and form with all exercises.  Session focus with knee mobility and proximal strengthening.  Therex focus on quad strengthening and knee AROM.  Added prone knee hang to address extension lag that was tolerated well, added manual to address moderate restrictions to hamstrings.  Added prone knee hang and hamstring stretch to HEP with printout given.    Eval:  Patient is a 73 y.o. male  who was seen today for physical therapy evaluation and treatment for evaluation and treatment for a right total knee which was performed on 12/07/23.  At this time Antonio Colon has decreased ROM, decreased activity tolerance increased, decreased strength, decreased balance, increased swelling, increased pain and difficulty walking.  Antonio Colon will benefit from skilled PT to address these issues and maximize his functional ability. .   OBJECTIVE IMPAIRMENTS: decreased  activity tolerance, decreased balance, difficulty walking, decreased ROM, decreased strength, increased edema, and pain.   ACTIVITY LIMITATIONS: carrying, lifting, sitting, standing, squatting, stairs, toileting, and locomotion level  PARTICIPATION LIMITATIONS: meal prep, driving, shopping, community activity, and yard work  PERSONAL FACTORS: Fitness are also affecting patient's functional outcome.   REHAB POTENTIAL: Good  CLINICAL DECISION MAKING: Stable/uncomplicated  EVALUATION COMPLEXITY: Moderate   GOALS: Goals reviewed with patient? No  SHORT TERM GOALS: Target date: 01/20/24 PT to be I in HEP in order to  decrease his pain to no greater than a 1/10 Baseline: Goal status: INITIAL  2.  Pt ROM to be improved to 5-115 to allow pt to ambulate without a limp  Baseline:  Goal status: INITIAL  3.  PT strength to be increased by 1/2 grade to allow pt to go up and down 9 steps in a reciprocal manner.  Baseline:  Goal status: INITIAL  LONG TERM GOALS: Target date: 02/10/24  Pt ROM to be improved to 3-120 to allow pt to squat down to pick items off the floor, sit for over 2 hrs in comfort.  Baseline:  Goal status: INITIAL  2.  PT strength to be increased by 1 grade to allow pt to go up and down 18 steps in a reciprocal manner, return from a squatted position  Baseline:  Goal status: INITIAL  3.  Pt to be able to single leg stance for 10 seconds on both LE to reduce risk of falls.  Baseline:  Goal status: INITIAL    PLAN:  PT FREQUENCY: 3x/week  PT DURATION: 6 weeks  PLANNED INTERVENTIONS: 97110-Therapeutic exercises, 97530- Therapeutic activity, 97112- Neuromuscular re-education, 97535- Self Care, 02859- Manual therapy, [phone]- Gait training, and Patient/Family education  PLAN FOR NEXT SESSION: continue to see pt to progress with ROM and strength manual if needed for pain and to increase ROM   Antonio Colon, Antonio Colon  Antonio Colon, PTA 01/05/2024, 12:54 PM  01/05/2024, 12:54 PM

## 2024-01-06 ENCOUNTER — Other Ambulatory Visit (HOSPITAL_COMMUNITY): Payer: Self-pay | Admitting: Nurse Practitioner

## 2024-01-06 DIAGNOSIS — I719 Aortic aneurysm of unspecified site, without rupture: Secondary | ICD-10-CM

## 2024-01-07 ENCOUNTER — Ambulatory Visit (HOSPITAL_COMMUNITY): Payer: Medicare Other

## 2024-01-07 DIAGNOSIS — M25561 Pain in right knee: Secondary | ICD-10-CM

## 2024-01-07 DIAGNOSIS — M25661 Stiffness of right knee, not elsewhere classified: Secondary | ICD-10-CM

## 2024-01-07 DIAGNOSIS — M1711 Unilateral primary osteoarthritis, right knee: Secondary | ICD-10-CM | POA: Diagnosis not present

## 2024-01-07 DIAGNOSIS — M6281 Muscle weakness (generalized): Secondary | ICD-10-CM | POA: Diagnosis not present

## 2024-01-07 NOTE — Therapy (Signed)
 OUTPATIENT PHYSICAL THERAPY LOWER EXTREMITY TREATMENT   Patient Name: Antonio Colon MRN: [id number] DOB:20-Aug-1951, 73 y.o., male Today's Date: 01/07/2024  END OF SESSION:  PT End of Session - 01/07/24 1048     Visit Number 3    Number of Visits 12    Date for PT Re-Evaluation 02/10/24    Authorization Type UHC    PT Start Time 1048    PT Stop Time 1130    PT Time Calculation (min) 42 min    Activity Tolerance Patient tolerated treatment well    Behavior During Therapy [facility] for tasks assessed/performed             Past Medical History:  Diagnosis Date   CAD (coronary artery disease)    stents   Fatty liver    Fracture, rib 05/2021   right side   GERD (gastroesophageal reflux disease)    Heart disease    Heart murmur    childhood   Hiatal hernia    History of stress test 01/27/2012   Normal study. No significant ischemia demonstration, this low risk scan, no significant change compared to previous study.   Hypercholesteremia    Hyperlipidemia    Hypertension    Myocardial infarction (HCC) 2010   Obesity    Pneumonia 2019   Sleep apnea    cpap at night   Past Surgical History:  Procedure Laterality Date   AXILLARY LYMPH NODE DISSECTION Right 07/2021   BACK SURGERY  10/2019   Dr. Gillie: right L4-5 laminectomy   BIOPSY  06/02/2022   Procedure: BIOPSY;  Surgeon: Eartha Flavors, Toribio, MD;  [facility];  Service: Gastroenterology;;   CARDIAC CATHETERIZATION  10/2009   CAD stents to the LAD using an Endeavor drug eluting (3x50mm) by Dr Morgan, he had normal circumflex and RCA at the time.   COLONOSCOPY  06/15/2012   COLONOSCOPY N/A 01/09/2019   Procedure: COLONOSCOPY;  Surgeon: Golda Claudis PENNER, MD;  [facility];  Service: Endoscopy;  Laterality: N/A;  2:10pm   COLONOSCOPY WITH PROPOFOL  N/A 06/02/2022   Procedure: COLONOSCOPY WITH PROPOFOL ;  Surgeon: Eartha Flavors Toribio, MD;  [facility];  Service: Gastroenterology;   Laterality: N/A;  730 ASA 2   ESOPHAGOGASTRODUODENOSCOPY N/A 11/15/2015   Procedure: ESOPHAGOGASTRODUODENOSCOPY (EGD);  Surgeon: Claudis PENNER Golda, MD;  [facility];  Service: Endoscopy;  Laterality: N/A;  1:10 - moved to 12:55 - Ann notified pt   KNEE ARTHROSCOPY WITH MEDIAL MENISECTOMY Right 06/22/2023   Procedure: KNEE ARTHROSCOPY WITH MEDIAL AND LATERAL MENISCECTOMY;  Surgeon: Margrette Taft BRAVO, MD;  Location: [facility];  Service: Orthopedics;  Laterality: Right;   MELANOMA EXCISION  07/2021   back   POLYPECTOMY  01/09/2019   Procedure: POLYPECTOMY;  Surgeon: Golda Claudis PENNER, MD;  [facility];  Service: Endoscopy;;  colon   POLYPECTOMY  06/02/2022   Procedure: POLYPECTOMY;  Surgeon: Eartha Flavors Toribio, MD;  [facility];  Service: Gastroenterology;;   TONSILLECTOMY     TOTAL KNEE ARTHROPLASTY Right 12/07/2023   Procedure: TOTAL KNEE ARTHROPLASTY;  Surgeon: Margrette Taft BRAVO, MD;  Location: [facility];  Service: Orthopedics;  Laterality: Right;   Patient Active Problem List   Diagnosis Date Noted   S/P total knee arthroplasty, right 12/07/23 12/13/2023   Primary osteoarthritis of right knee 06/22/2023   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 06/22/2023   Hx of melanoma excision 03/01/2023   Chronic  diarrhea 04/23/2022   Abdominal pain 04/02/2022   Liver lesion, left lobe 10/30/2021   Diverticulosis of colon without hemorrhage 10/30/2021   Calculus of gallbladder without cholecystitis without obstruction 10/30/2021   Spondylolisthesis of lumbar region 07/10/2020   Obstructive sleep apnea 08/26/2018   Respiratory failure (HCC) 06/01/2018   Morbid obesity (HCC) 06/01/2018   NAFLD (nonalcoholic fatty liver disease) 98/70/7986   GERD (gastroesophageal reflux disease) 01/26/2012   CAD S/P percutaneous coronary angioplasty 01/26/2012   Hyperlipemia 01/26/2012   HTN (hypertension) 01/26/2012   FRACTURE, MEDIAL MALLEOLUS  11/20/2008   Sprain of ankle 11/20/2008    PCP: Norleen Hurst  REFERRING PROVIDER: Taft Minerva   REFERRING DIAG:  Diagnosis  M17.11 (ICD-10-CM) - Unilateral primary osteoarthritis, right knee    THERAPY DIAG:  Right knee pain, edema, muscle weakness,   Rationale for Evaluation and Treatment: Rehabilitation  ONSET DATE: 12/07/23  SUBJECTIVE:   SUBJECTIVE STATEMENT: Went to work out at [employer] and able to ride the bike there.  Having trouble sleeping  Eval:  PT states that he has been getting HH which ended last Friday.  The pt states that he can sit for an hour to an hour and a half,  stand for 30 minutes, walking for 30 minutes.  PT states that he is still sleeping in the recliner.    PERTINENT HISTORY: OA PAIN:  Are you having pain? Yes: NPRS scale: 0 ; worst pain 3/10 Pain location: inside  Pain description: aching, throb  Aggravating factors: activity  Relieving factors: ice and meds   PRECAUTIONS: Fall   WEIGHT BEARING RESTRICTIONS: No  FALLS:  Has patient fallen in last 6 months? No  LIVING ENVIRONMENT: Lives with: lives with their family Lives in: House/apartment Stairs: Yes: Internal: 18 steps; on right going up; does not complete reciprocal yet  Has following equipment at home: Single point cane  OCCUPATION: retired part time with city; riding in a truck handling complaints   PLOF: Independent  PATIENT GOALS: decreased pain Improved mobility   NEXT MD VISIT: 01/20/24  OBJECTIVE:  Note: Objective measures were completed at Evaluation unless otherwise noted.   PATIENT SURVEYS:  FOTO 57  COGNITION: Overall cognitive status: Within functional limits for tasks assessed     SENSATION: Not tested  LOWER EXTREMITY ROM:  Active ROM Right eval Left eval Right  01/05/24 Right 01/07/24  Hip flexion      Hip extension      Hip abduction      Hip adduction      Hip internal rotation      Hip external rotation      Knee flexion 105  115 123   Knee extension 14  8 -8 (lacking)  Ankle dorsiflexion      Ankle plantarflexion      Ankle inversion      Ankle eversion       (Blank rows = not tested)  LOWER EXTREMITY MMT:  MMT Right eval Left eval  Hip flexion 4+ 5  Hip extension 3 3  Hip abduction 4 5  Hip adduction    Hip internal rotation    Hip external rotation    Knee flexion 3+ 5  Knee extension 3 5  Ankle dorsiflexion 5 5  Ankle plantarflexion    Ankle inversion    Ankle eversion     (Blank rows = not tested) FUNCTIONAL TESTS:  30 seconds chair stand test:  11 below average; 13 is average  2 minute walk test:  337 ft no assistive device with significant limp Single leg stance:  Rt:1  , Lt:  1                                                                                                                                TREATMENT DATE:  01/07/24 STM and man ROM to right knee to improve soft tissue extensibility and increase mobility x 10'  AROM right knee in supine -8 to 123 Bike seat 13 x 5' for mobility Standing: Heel raises 2 x 10 Slant board 5 x 20 4 box step ups 2 x 10 4 lateral step ups 2 x 10 [employer]craft TKE's 3 plates x 30   07/28/73: Reviewed goals Educated importance of HEP compliance for maximal benefits  Supine: Quad sets 10x 5 SAQ 10x5 SLR 10x 2 sets with quad set prior Hamstring stretch 3x 30 Bridge 10x 5  Prone knee hang 4' with manual to hamstrings  AROM 8-115  Standing: TKE with GTB 10x 5 Slant board 2x 30  12/30/2023 Eval:  Quad set x 5 Heel slide x 5 Sit to stand x 10   PATIENT EDUCATION:  Education details: HEP Person educated: Patient Education method: Programmer, Multimedia, Verbal cues, and Handouts Education comprehension: returned demonstration  HOME EXERCISE PROGRAM: Access Code: [email or web address] URL: [email or web address] Date: 12/30/2023 Prepared by: Montie Metro  Exercises - Seated Long Arc Quad  - 3 x daily - 7 x weekly - 1 sets - 10 reps - 5  hold - Quad Setting and Stretching  - 3 x daily - 7 x weekly - 1 sets - 10 reps - 5 hold - Supine Heel Slides  - 2 x daily - 7 x weekly - 1 sets - 10 reps - 5 hold - Heel Raises with Counter Support  - 2 x daily - 7 x weekly - 1 sets - 10 reps - 3-5 hold - Sit to Stand  - 3 x daily - 7 x weekly - 1 sets - 10 reps - 3 hold  01/05/24: - Hooklying Hamstring Stretch with Strap  - 2 x daily - 7 x weekly - 3 sets - 3 reps - 30 hold - Prone Knee Extension Hang  - 2 x daily - 7 x weekly - 1 sets - 1 reps - 5' hold - Supine Bridge  - 2 x daily - 7 x weekly - 2 sets - 10 reps - 5 hold  ASSESSMENT:  CLINICAL IMPRESSION: Today's session started with STM to right knee to improve soft tissue extensibility and mobility.  Good progression with knee flexion; still has a bit of extension lag.  Able to make full forward revolutions on bike. Continues to walk with SPC in PT gym; decreased stance right lower extremity.   Patient will benefit from continued skilled therapy services to address deficits and promote return to optimal function.      Eval:  Patient is a 73 y.o. male  who was seen today for physical therapy evaluation and treatment for evaluation and treatment for a right total knee which was performed on 12/07/23.  At this time Mr. Weil has decreased ROM, decreased activity tolerance increased, decreased strength, decreased balance, increased swelling, increased pain and difficulty walking.  Mr. Eckhardt will benefit from skilled PT to address these issues and maximize his functional ability. .   OBJECTIVE IMPAIRMENTS: decreased activity tolerance, decreased balance, difficulty walking, decreased ROM, decreased strength, increased edema, and pain.   ACTIVITY LIMITATIONS: carrying, lifting, sitting, standing, squatting, stairs, toileting, and locomotion level  PARTICIPATION LIMITATIONS: meal prep, driving, shopping, community activity, and yard work  PERSONAL FACTORS: Fitness are also affecting  patient's functional outcome.   REHAB POTENTIAL: Good  CLINICAL DECISION MAKING: Stable/uncomplicated  EVALUATION COMPLEXITY: Moderate   GOALS: Goals reviewed with patient? No  SHORT TERM GOALS: Target date: 01/20/24 PT to be I in HEP in order to decrease his pain to no greater than a 1/10 Baseline: Goal status: INITIAL  2.  Pt ROM to be improved to 5-115 to allow pt to ambulate without a limp  Baseline:  Goal status: INITIAL  3.  PT strength to be increased by 1/2 grade to allow pt to go up and down 9 steps in a reciprocal manner.  Baseline:  Goal status: INITIAL  LONG TERM GOALS: Target date: 02/10/24  Pt ROM to be improved to 3-120 to allow pt to squat down to pick items off the floor, sit for over 2 hrs in comfort.  Baseline:  Goal status: INITIAL  2.  PT strength to be increased by 1 grade to allow pt to go up and down 18 steps in a reciprocal manner, return from a squatted position  Baseline:  Goal status: INITIAL  3.  Pt to be able to single leg stance for 10 seconds on both LE to reduce risk of falls.  Baseline:  Goal status: INITIAL    PLAN:  PT FREQUENCY: 3x/week  PT DURATION: 6 weeks  PLANNED INTERVENTIONS: 97110-Therapeutic exercises, 97530- Therapeutic activity, 97112- Neuromuscular re-education, 97535- Self Care, 02859- Manual therapy, [phone]- Gait training, and Patient/Family education  PLAN FOR NEXT SESSION: continue to see pt to progress with ROM and strength manual if needed for pain and to increase ROM   11:34 AM, 01/07/24 Vidya Bamford Small Rilley Stash MPT [facility] [place] [phone] Ph:[phone]

## 2024-01-07 NOTE — Addendum Note (Signed)
 Addended by: Bella Kennedy on: 01/07/2024 02:28 PM   Modules accepted: Orders

## 2024-01-10 ENCOUNTER — Ambulatory Visit (HOSPITAL_COMMUNITY): Payer: Medicare Other

## 2024-01-10 ENCOUNTER — Encounter (HOSPITAL_COMMUNITY): Payer: Self-pay

## 2024-01-10 DIAGNOSIS — M6281 Muscle weakness (generalized): Secondary | ICD-10-CM

## 2024-01-10 DIAGNOSIS — M1711 Unilateral primary osteoarthritis, right knee: Secondary | ICD-10-CM | POA: Diagnosis not present

## 2024-01-10 DIAGNOSIS — M25661 Stiffness of right knee, not elsewhere classified: Secondary | ICD-10-CM

## 2024-01-10 DIAGNOSIS — M25561 Pain in right knee: Secondary | ICD-10-CM | POA: Diagnosis not present

## 2024-01-10 NOTE — Therapy (Signed)
 OUTPATIENT PHYSICAL THERAPY LOWER EXTREMITY TREATMENT   Patient Name: Antonio Colon MRN: [id number] DOB:01-24-1951, 73 y.o., male Today's Date: 01/10/2024  END OF SESSION:  PT End of Session - 01/10/24 1140     Visit Number 4    Number of Visits 12    Date for PT Re-Evaluation 02/10/24    Authorization Type UHC    Authorization Time Period 18v 12/31/23-02/10/2024    Progress Note Due on Visit 10    PT Start Time 1057    PT Stop Time 1140    PT Time Calculation (min) 43 min    Activity Tolerance Patient tolerated treatment well    Behavior During Therapy [facility] for tasks assessed/performed              Past Medical History:  Diagnosis Date   CAD (coronary artery disease)    stents   Fatty liver    Fracture, rib 05/2021   right side   GERD (gastroesophageal reflux disease)    Heart disease    Heart murmur    childhood   Hiatal hernia    History of stress test 01/27/2012   Normal study. No significant ischemia demonstration, this low risk scan, no significant change compared to previous study.   Hypercholesteremia    Hyperlipidemia    Hypertension    Myocardial infarction (HCC) 2010   Obesity    Pneumonia 2019   Sleep apnea    cpap at night   Past Surgical History:  Procedure Laterality Date   AXILLARY LYMPH NODE DISSECTION Right 07/2021   BACK SURGERY  10/2019   Dr. Gillie: right L4-5 laminectomy   BIOPSY  06/02/2022   Procedure: BIOPSY;  Surgeon: Eartha Flavors, Toribio, MD;  [facility];  Service: Gastroenterology;;   CARDIAC CATHETERIZATION  10/2009   CAD stents to the LAD using an Endeavor drug eluting (3x30mm) by Dr Morgan, he had normal circumflex and RCA at the time.   COLONOSCOPY  06/15/2012   COLONOSCOPY N/A 01/09/2019   Procedure: COLONOSCOPY;  Surgeon: Golda Claudis PENNER, MD;  [facility];  Service: Endoscopy;  Laterality: N/A;  2:10pm   COLONOSCOPY WITH PROPOFOL  N/A 06/02/2022   Procedure: COLONOSCOPY WITH PROPOFOL ;  Surgeon:  Eartha Flavors Toribio, MD;  [facility];  Service: Gastroenterology;  Laterality: N/A;  730 ASA 2   ESOPHAGOGASTRODUODENOSCOPY N/A 11/15/2015   Procedure: ESOPHAGOGASTRODUODENOSCOPY (EGD);  Surgeon: Claudis PENNER Golda, MD;  [facility];  Service: Endoscopy;  Laterality: N/A;  1:10 - moved to 12:55 - Ann notified pt   KNEE ARTHROSCOPY WITH MEDIAL MENISECTOMY Right 06/22/2023   Procedure: KNEE ARTHROSCOPY WITH MEDIAL AND LATERAL MENISCECTOMY;  Surgeon: Margrette Taft BRAVO, MD;  Location: [facility];  Service: Orthopedics;  Laterality: Right;   MELANOMA EXCISION  07/2021   back   POLYPECTOMY  01/09/2019   Procedure: POLYPECTOMY;  Surgeon: Golda Claudis PENNER, MD;  [facility];  Service: Endoscopy;;  colon   POLYPECTOMY  06/02/2022   Procedure: POLYPECTOMY;  Surgeon: Eartha Flavors Toribio, MD;  [facility];  Service: Gastroenterology;;   TONSILLECTOMY     TOTAL KNEE ARTHROPLASTY Right 12/07/2023   Procedure: TOTAL KNEE ARTHROPLASTY;  Surgeon: Margrette Taft BRAVO, MD;  Location: [facility];  Service: Orthopedics;  Laterality: Right;   Patient Active Problem List   Diagnosis Date Noted   S/P total knee arthroplasty, right 12/07/23 12/13/2023   Primary osteoarthritis of right knee 06/22/2023   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 06/22/2023   Hx of melanoma excision 03/01/2023   Chronic diarrhea 04/23/2022   Abdominal pain 04/02/2022   Liver lesion, left lobe 10/30/2021   Diverticulosis of colon without hemorrhage 10/30/2021   Calculus of gallbladder without cholecystitis without obstruction 10/30/2021   Spondylolisthesis of lumbar region 07/10/2020   Obstructive sleep apnea 08/26/2018   Respiratory failure (HCC) 06/01/2018   Morbid obesity (HCC) 06/01/2018   NAFLD (nonalcoholic fatty liver disease) 98/70/7986   GERD (gastroesophageal reflux disease) 01/26/2012   CAD S/P percutaneous coronary angioplasty 01/26/2012    Hyperlipemia 01/26/2012   HTN (hypertension) 01/26/2012   FRACTURE, MEDIAL MALLEOLUS 11/20/2008   Sprain of ankle 11/20/2008    PCP: Norleen Hurst  REFERRING PROVIDER: Taft Minerva   REFERRING DIAG:  Diagnosis  M17.11 (ICD-10-CM) - Unilateral primary osteoarthritis, right knee    THERAPY DIAG:  Right knee pain, edema, muscle weakness,   Rationale for Evaluation and Treatment: Rehabilitation  ONSET DATE: 12/07/23  SUBJECTIVE:   SUBJECTIVE STATEMENT: Pt reporting that his right knee is a little sore. No issues with HEP per patient. 2/10 pain scale this session. Pt reports not having issues with stairs, is utilizing learned technique for pain modulation.  Eval:  PT states that he has been getting HH which ended last Friday.  The pt states that he can sit for an hour to an hour and a half,  stand for 30 minutes, walking for 30 minutes.  PT states that he is still sleeping in the recliner.    PERTINENT HISTORY: OA PAIN:  Are you having pain? Yes: NPRS scale: 0 ; worst pain 3/10 Pain location: inside  Pain description: aching, throb  Aggravating factors: activity  Relieving factors: ice and meds   PRECAUTIONS: Fall   WEIGHT BEARING RESTRICTIONS: No  FALLS:  Has patient fallen in last 6 months? No  LIVING ENVIRONMENT: Lives with: lives with their family Lives in: House/apartment Stairs: Yes: Internal: 18 steps; on right going up; does not complete reciprocal yet  Has following equipment at home: Single point cane  OCCUPATION: retired part time with city; riding in a truck handling complaints   PLOF: Independent  PATIENT GOALS: decreased pain Improved mobility   NEXT MD VISIT: 01/20/24  OBJECTIVE:  Note: Objective measures were completed at Evaluation unless otherwise noted.   PATIENT SURVEYS:  FOTO 57  COGNITION: Overall cognitive status: Within functional limits for tasks assessed     SENSATION: Not tested  LOWER EXTREMITY ROM:  Active ROM  Right eval Left eval Right  01/05/24 Right 01/07/24  Hip flexion      Hip extension      Hip abduction      Hip adduction      Hip internal rotation      Hip external rotation      Knee flexion 105  115 123  Knee extension 14  8 -8 (lacking)  Ankle dorsiflexion      Ankle plantarflexion      Ankle inversion      Ankle eversion       (Blank rows = not tested)  LOWER EXTREMITY MMT:  MMT Right eval Left eval  Hip flexion 4+ 5  Hip extension 3 3  Hip abduction 4 5  Hip adduction    Hip internal rotation    Hip external rotation    Knee flexion 3+ 5  Knee extension 3 5  Ankle dorsiflexion 5 5  Ankle plantarflexion    Ankle  inversion    Ankle eversion     (Blank rows = not tested) FUNCTIONAL TESTS:  30 seconds chair stand test:  11 below average; 13 is average  2 minute walk test: 337 ft no assistive device with significant limp Single leg stance:  Rt:1  , Lt:  1                                                                                                                                TREATMENT DATE:  01/10/2024  -5' recumbent bike seat 13-full revolutions -slant board stretch 2 x 1' -Seated passive weighted R knee extension stretch with R heel on box and 5lb DB with gait belt at proximal patella 2x 1' -Bodycraft Tke #3 plate k79 with 3''iso -6in stepup with single UE support 2 x 10 with cues for anterior trunk shift -2in stepups without UE support 3 x 5 with cues for anterior weight shift of trunk -2in lateral stepups without UE support 2 x 5 with cues for TKE. -4in step-up without UE support x 5 - 10x sit/stand from elevated chair with tidal tank -Education on tandem stance at home with HEP updated -Education regarding hanging heel in recliner for TKE ROM daily.   01/07/24 STM and man ROM to right knee to improve soft tissue extensibility and increase mobility x 10'  AROM right knee in supine -8 to 123 Bike seat 13 x 5' for mobility Standing: Heel raises 2 x  10 Slant board 5 x 20 4 box step ups 2 x 10 4 lateral step ups 2 x 10 [employer] TKE's 3 plates x 30   07/28/73: Reviewed goals Educated importance of HEP compliance for maximal benefits  Supine: Quad sets 10x 5 SAQ 10x5 SLR 10x 2 sets with quad set prior Hamstring stretch 3x 30 Bridge 10x 5  Prone knee hang 4' with manual to hamstrings  AROM 8-115  Standing: TKE with GTB 10x 5 Slant board 2x 30  PATIENT EDUCATION:  Education details: HEP Person educated: Patient Education method: Explanation, Verbal cues, and Handouts Education comprehension: returned demonstration  HOME EXERCISE PROGRAM: Access Code: [email or web address] URL: [email or web address] Date: 12/30/2023 Prepared by: Montie Metro  Exercises - Seated Long Arc Quad  - 3 x daily - 7 x weekly - 1 sets - 10 reps - 5 hold - Quad Setting and Stretching  - 3 x daily - 7 x weekly - 1 sets - 10 reps - 5 hold - Supine Heel Slides  - 2 x daily - 7 x weekly - 1 sets - 10 reps - 5 hold - Heel Raises with Counter Support  - 2 x daily - 7 x weekly - 1 sets - 10 reps - 3-5 hold - Sit to Stand  - 3 x daily - 7 x weekly - 1 sets - 10 reps - 3 hold  01/05/24: - Hooklying Hamstring Stretch with Strap  - 2 x daily - 7  x weekly - 3 sets - 3 reps - 30 hold - Prone Knee Extension Hang  - 2 x daily - 7 x weekly - 1 sets - 1 reps - 5' hold - Supine Bridge  - 2 x daily - 7 x weekly - 2 sets - 10 reps - 5 hold Access Code: [email or web address] URL: [email or web address] Date: 01/10/2024 Prepared by: Omega Bottcher  Exercises - Tandem Stance with Support  - 1 x daily - 7 x weekly - 3 sets - 10 reps - 30 hold  ASSESSMENT:  CLINICAL IMPRESSION: Pt tolerating session today. Primary focus was closed kinetic chain movements after prolonged extension ROM to improve end range R knee ROM. Pt continues to ambulate with moderate antalgic gait pattern, this continues to be correlated to extension ROM deficits. Pt  regressed to two inch box during stair training to reduced UE assist and improve confidence with translating full weight on RLE. Tolerated challenges well. Fatigued appropriately throughout session. Patient will benefit from continued skilled therapy services to address deficits and promote return to optimal function.      Eval:  Patient is a 73 y.o. male  who was seen today for physical therapy evaluation and treatment for evaluation and treatment for a right total knee which was performed on 12/07/23.  At this time Mr. Cortright has decreased ROM, decreased activity tolerance increased, decreased strength, decreased balance, increased swelling, increased pain and difficulty walking.  Mr. Lothrop will benefit from skilled PT to address these issues and maximize his functional ability. .   OBJECTIVE IMPAIRMENTS: decreased activity tolerance, decreased balance, difficulty walking, decreased ROM, decreased strength, increased edema, and pain.   ACTIVITY LIMITATIONS: carrying, lifting, sitting, standing, squatting, stairs, toileting, and locomotion level  PARTICIPATION LIMITATIONS: meal prep, driving, shopping, community activity, and yard work  PERSONAL FACTORS: Fitness are also affecting patient's functional outcome.   REHAB POTENTIAL: Good  CLINICAL DECISION MAKING: Stable/uncomplicated  EVALUATION COMPLEXITY: Moderate   GOALS: Goals reviewed with patient? No  SHORT TERM GOALS: Target date: 01/20/24 PT to be I in HEP in order to decrease his pain to no greater than a 1/10 Baseline: Goal status: INITIAL  2.  Pt ROM to be improved to 5-115 to allow pt to ambulate without a limp  Baseline:  Goal status: INITIAL  3.  PT strength to be increased by 1/2 grade to allow pt to go up and down 9 steps in a reciprocal manner.  Baseline:  Goal status: INITIAL  LONG TERM GOALS: Target date: 02/10/24  Pt ROM to be improved to 3-120 to allow pt to squat down to pick items off the floor, sit for over 2  hrs in comfort.  Baseline:  Goal status: INITIAL  2.  PT strength to be increased by 1 grade to allow pt to go up and down 18 steps in a reciprocal manner, return from a squatted position  Baseline:  Goal status: INITIAL  3.  Pt to be able to single leg stance for 10 seconds on both LE to reduce risk of falls.  Baseline:  Goal status: INITIAL    PLAN:  PT FREQUENCY: 3x/week  PT DURATION: 6 weeks  PLANNED INTERVENTIONS: 97110-Therapeutic exercises, 97530- Therapeutic activity, [id number]- Neuromuscular re-education, 97535- Self Care, 02859- Manual therapy, [phone]- Gait training, and Patient/Family education  PLAN FOR NEXT SESSION: continue to see pt to progress with ROM and strength manual if needed for pain and to increase ROM   11:41 AM, 01/10/24 Omega JONETTA Bottcher PT, DPT [facility] [place] [phone] office

## 2024-01-12 ENCOUNTER — Ambulatory Visit (HOSPITAL_COMMUNITY): Payer: Medicare Other

## 2024-01-12 ENCOUNTER — Encounter (HOSPITAL_COMMUNITY): Payer: Self-pay

## 2024-01-12 DIAGNOSIS — M25561 Pain in right knee: Secondary | ICD-10-CM

## 2024-01-12 DIAGNOSIS — M6281 Muscle weakness (generalized): Secondary | ICD-10-CM

## 2024-01-12 DIAGNOSIS — M25661 Stiffness of right knee, not elsewhere classified: Secondary | ICD-10-CM | POA: Diagnosis not present

## 2024-01-12 DIAGNOSIS — M1711 Unilateral primary osteoarthritis, right knee: Secondary | ICD-10-CM | POA: Diagnosis not present

## 2024-01-12 NOTE — Therapy (Signed)
 OUTPATIENT PHYSICAL THERAPY LOWER EXTREMITY TREATMENT   Patient Name: Antonio Colon MRN: [id number] DOB:[birth year], 73 y.o., male Today's Date: 01/12/2024  END OF SESSION:  PT End of Session - 01/12/24 1102     Visit Number 5    Number of Visits 12    Date for PT Re-Evaluation 02/10/24    Authorization Type [employer]    Authorization Time Period 18v 12/31/23-02/10/2024    Progress Note Due on Visit 10    PT Start Time 1102    PT Stop Time 1142    PT Time Calculation (min) 40 min    Activity Tolerance Patient tolerated treatment well    Behavior During Therapy [facility] for tasks assessed/performed              Past Medical History:  Diagnosis Date   CAD (coronary artery disease)    stents   Fatty liver    Fracture, rib 05/2021   right side   GERD (gastroesophageal reflux disease)    Heart disease    Heart murmur    childhood   Hiatal hernia    History of stress test 01/27/2012   Normal study. No significant ischemia demonstration, this low risk scan, no significant change compared to previous study.   Hypercholesteremia    Hyperlipidemia    Hypertension    Myocardial infarction (HCC) 2010   Obesity    Pneumonia 2019   Sleep apnea    cpap at night   Past Surgical History:  Procedure Laterality Date   AXILLARY LYMPH NODE DISSECTION Right 07/2021   BACK SURGERY  10/2019   Dr. Michale Age: right L4-5 laminectomy   BIOPSY  06/02/2022   Procedure: BIOPSY;  Surgeon: Umberto Ganong, Bearl Limes, MD;  [facility]: [facility];  Service: Gastroenterology;;   CARDIAC CATHETERIZATION  10/2009   CAD stents to the LAD using an Endeavor drug eluting (3x74mm) by Dr Nathen Balder, he had normal circumflex and RCA at the time.   COLONOSCOPY  06/15/2012   COLONOSCOPY N/A 01/09/2019   Procedure: COLONOSCOPY;  Surgeon: Ruby Corporal, MD;  [facility]: [facility];  Service: Endoscopy;  Laterality: N/A;  2:10pm   COLONOSCOPY WITH PROPOFOL  N/A 06/02/2022   Procedure: COLONOSCOPY WITH PROPOFOL ;  Surgeon:  Urban Garden, MD;  [facility]: [facility];  Service: Gastroenterology;  Laterality: N/A;  730 ASA 2   ESOPHAGOGASTRODUODENOSCOPY N/A 11/15/2015   Procedure: ESOPHAGOGASTRODUODENOSCOPY (EGD);  Surgeon: Ruby Corporal, MD;  [facility]: [facility];  Service: Endoscopy;  Laterality: N/A;  1:10 - moved to 12:55 - Ann notified pt   KNEE ARTHROSCOPY WITH MEDIAL MENISECTOMY Right 06/22/2023   Procedure: KNEE ARTHROSCOPY WITH MEDIAL AND LATERAL MENISCECTOMY;  Surgeon: Darrin Emerald, MD;  Location: [facility];  Service: Orthopedics;  Laterality: Right;   MELANOMA EXCISION  07/2021   back   POLYPECTOMY  01/09/2019   Procedure: POLYPECTOMY;  Surgeon: Ruby Corporal, MD;  [facility]: [facility];  Service: Endoscopy;;  colon   POLYPECTOMY  06/02/2022   Procedure: POLYPECTOMY;  Surgeon: Urban Garden, MD;  [facility]: [facility];  Service: Gastroenterology;;   TONSILLECTOMY     TOTAL KNEE ARTHROPLASTY Right 12/07/2023   Procedure: TOTAL KNEE ARTHROPLASTY;  Surgeon: Darrin Emerald, MD;  Location: [facility];  Service: Orthopedics;  Laterality: Right;   Patient Active Problem List   Diagnosis Date Noted   S/P total knee arthroplasty, right 12/07/23 12/13/2023   Primary osteoarthritis of right knee 06/22/2023   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 06/22/2023   Hx of melanoma excision 03/01/2023   Chronic diarrhea 04/23/2022   Abdominal pain 04/02/2022   Liver lesion, left lobe 10/30/2021   Diverticulosis of colon without hemorrhage 10/30/2021   Calculus of gallbladder without cholecystitis without obstruction 10/30/2021   Spondylolisthesis of lumbar region 07/10/2020   Obstructive sleep apnea 08/26/2018   Respiratory failure (HCC) 06/01/2018   Morbid obesity (HCC) 06/01/2018   NAFLD (nonalcoholic fatty liver disease) 16/09/9603   GERD (gastroesophageal reflux disease) 01/26/2012   CAD S/P percutaneous coronary angioplasty 01/26/2012    Hyperlipemia 01/26/2012   HTN (hypertension) 01/26/2012   FRACTURE, MEDIAL MALLEOLUS 11/20/2008   Sprain of ankle 11/20/2008    PCP: Denman Fischer  REFERRING PROVIDER: Elsa Halls   REFERRING DIAG:  Diagnosis  M17.11 (ICD-10-CM) - Unilateral primary osteoarthritis, right knee    THERAPY DIAG:  Right knee pain, edema, muscle weakness,   Rationale for Evaluation and Treatment: Rehabilitation  ONSET DATE: 12/07/23  SUBJECTIVE:   SUBJECTIVE STATEMENT: Pt arrived with [facility], stated he feels good today.  Reports going to [facility] on days that he doesn't have therapy.  Has been leading with Rt LE on stairs, feels he is getting back to "normal".  Stated balance activities are difficult.  Eval:  PT states that he has been getting HH which ended last Friday.  The pt states that he can sit for an hour to an hour and a half,  stand for 30 minutes, walking for 30 minutes.  PT states that he is still sleeping in the recliner.    PERTINENT HISTORY: OA PAIN:  Are you having pain? Yes: NPRS scale: 0 ; worst pain 3/10 Pain location: inside  Pain description: aching, throb  Aggravating factors: activity  Relieving factors: ice and meds   PRECAUTIONS: Fall   WEIGHT BEARING RESTRICTIONS: No  FALLS:  Has patient fallen in last 6 months? No  LIVING ENVIRONMENT: Lives with: lives with their family Lives in: House/apartment Stairs: Yes: Internal: 18 steps; on right going up; does not complete reciprocal yet  Has following equipment at home: Single point cane  OCCUPATION: retired part time with city; riding in a truck handling complaints   PLOF: Independent  PATIENT GOALS: decreased pain Improved mobility   NEXT MD VISIT: 01/20/24  OBJECTIVE:  Note: Objective measures were completed at Evaluation unless otherwise noted.   PATIENT SURVEYS:  FOTO 57  COGNITION: Overall cognitive status: Within functional limits for tasks assessed     SENSATION: Not tested  LOWER EXTREMITY  ROM:  Active ROM Right eval Left eval Right  01/05/24 Right 01/07/24  Hip flexion      Hip extension      Hip abduction      Hip adduction      Hip internal rotation      Hip external rotation      Knee flexion 105  115 123  Knee extension 14  8 -8 (lacking)  Ankle dorsiflexion      Ankle plantarflexion      Ankle inversion      Ankle eversion       (Blank rows = not tested)  LOWER EXTREMITY MMT:  MMT Right eval Left eval  Hip flexion 4+ 5  Hip extension 3 3  Hip abduction 4 5  Hip adduction    Hip internal rotation    Hip external rotation    Knee flexion 3+ 5  Knee extension 3 5  Ankle dorsiflexion 5  5  Ankle plantarflexion    Ankle inversion    Ankle eversion     (Blank rows = not tested) FUNCTIONAL TESTS:  30 seconds chair stand test:  11 below average; 13 is average  2 minute walk test: 337 ft no assistive device with significant limp Single leg stance:  Rt:1"  , Lt:  1"                                                                                                                                TREATMENT DATE:  01/12/24: Bike x 5' full revolution seat 14 then 13 Squats front of chair 10x Bodycraft Tke #3 plate Z61 with 3''iso Stairs reciprocal pattern 4in 1Rt, 7in 2RT with 2 HRs, decreased eccentric control descending 4in lateral step up 2x 10 1HR 4in step down 2x 10, improved control with 2 HHA vs 1 SLS 1-2" max BLE  Vector stance 3x 5"  AROM 2-120 degrees Sidestep 4RT inside // bars no HHA GTB around thigh Slant board 2x 30" Hamstring stretch 2x 30" standing 12in step   01/10/2024  -5' recumbent bike seat 13-full revolutions -slant board stretch 2 x 1' -Seated passive weighted R knee extension stretch with R heel on box and 5lb DB with gait belt at proximal patella 2x 1' -Bodycraft Tke #3 plate W96 with 3''iso -6in stepup with single UE support 2 x 10 with cues for anterior trunk shift -2in stepups without UE support 3 x 5 with cues for anterior  weight shift of trunk -2in lateral stepups without UE support 2 x 5 with cues for TKE. -4in step-up without UE support x 5 - 10x sit/stand from elevated chair with tidal tank -Education on tandem stance at home with HEP updated -Education regarding hanging heel in recliner for TKE ROM daily.   01/07/24 STM and man ROM to right knee to improve soft tissue extensibility and increase mobility x 10'  AROM right knee in supine -8 to 123 Bike seat 13 x 5' for mobility Standing: Heel raises 2 x 10 Slant board 5 x 20" 4" box step ups 2 x 10 4" lateral step ups 2 x 10 Bodycraft TKE's 3 plates x 30   0/4/54: Reviewed goals Educated importance of HEP compliance for maximal benefits  Supine: Quad sets 10x 5" SAQ 10x5" SLR 10x 2 sets with quad set prior Hamstring stretch 3x 30" Bridge 10x 5"  Prone knee hang 4' with manual to hamstrings  AROM 8-115  Standing: TKE with GTB 10x 5" Slant board 2x 30"  PATIENT EDUCATION:  Education details: HEP Person educated: Patient Education method: Explanation, Verbal cues, and Handouts Education comprehension: returned demonstration  HOME EXERCISE PROGRAM: Access Code: [email or web address] URL: [email or web address] Date: 12/30/2023 Prepared by: Leodis Rainwater  Exercises - Seated Long Arc Quad  - 3 x daily - 7 x weekly - 1 sets - 10 reps - 5" hold - Quad Setting and Stretching  -  3 x daily - 7 x weekly - 1 sets - 10 reps - 5" hold - Supine Heel Slides  - 2 x daily - 7 x weekly - 1 sets - 10 reps - 5" hold - Heel Raises with Counter Support  - 2 x daily - 7 x weekly - 1 sets - 10 reps - 3-5" hold - Sit to Stand  - 3 x daily - 7 x weekly - 1 sets - 10 reps - 3" hold  01/05/24: - Hooklying Hamstring Stretch with Strap  - 2 x daily - 7 x weekly - 3 sets - 3 reps - 30" hold - Prone Knee Extension Hang  - 2 x daily - 7 x weekly - 1 sets - 1 reps - 5' hold - Supine Bridge  - 2 x daily - 7 x weekly - 2 sets - 10 reps - 5" hold Access Code:  [email or web address] URL: [email or web address] Date: 01/10/2024 Prepared by: Irene Mannheim  Exercises - Tandem Stance with Support  - 1 x daily - 7 x weekly - 3 sets - 10 reps - 30 hold  ASSESSMENT:  CLINICAL IMPRESSION: Session focus on quad strength for knee mobility and functional strengthening.  Pt is making good gains with AROM 2-120 degrees today.  Pt presents with impaired eccentric quad control and balance.  Added balance and gluteal strengthening exercises to assist with SLS.  Pt tolerated well to session with appropriate fatigue levels     Eval:  Patient is a 73 y.o. male  who was seen today for physical therapy evaluation and treatment for evaluation and treatment for a right total knee which was performed on 12/07/23.  At this time Mr. Goldner has decreased ROM, decreased activity tolerance increased, decreased strength, decreased balance, increased swelling, increased pain and difficulty walking.  Mr. Reffner will benefit from skilled PT to address these issues and maximize his functional ability. .   OBJECTIVE IMPAIRMENTS: decreased activity tolerance, decreased balance, difficulty walking, decreased ROM, decreased strength, increased edema, and pain.   ACTIVITY LIMITATIONS: carrying, lifting, sitting, standing, squatting, stairs, toileting, and locomotion level  PARTICIPATION LIMITATIONS: meal prep, driving, shopping, community activity, and yard work  PERSONAL FACTORS: Fitness are also affecting patient's functional outcome.   REHAB POTENTIAL: Good  CLINICAL DECISION MAKING: Stable/uncomplicated  EVALUATION COMPLEXITY: Moderate   GOALS: Goals reviewed with patient? No  SHORT TERM GOALS: Target date: 01/20/24 PT to be I in HEP in order to decrease his pain to no greater than a 1/10 Baseline: Goal status: INITIAL  2.  Pt ROM to be improved to 5-115 to allow pt to ambulate without a limp  Baseline:  Goal status: INITIAL  3.  PT strength to be increased by 1/2  grade to allow pt to go up and down 9 steps in a reciprocal manner.  Baseline:  Goal status: INITIAL  LONG TERM GOALS: Target date: 02/10/24  Pt ROM to be improved to 3-120 to allow pt to squat down to pick items off the floor, sit for over 2 hrs in comfort.  Baseline:  Goal status: INITIAL  2.  PT strength to be increased by 1 grade to allow pt to go up and down 18 steps in a reciprocal manner, return from a squatted position  Baseline:  Goal status: INITIAL  3.  Pt to be able to single leg stance for 10 seconds on both LE to reduce risk of falls.  Baseline:  Goal status: INITIAL    PLAN:  PT  FREQUENCY: 3x/week  PT DURATION: 6 weeks  PLANNED INTERVENTIONS: 97110-Therapeutic exercises, 97530- Therapeutic activity, [id number]- Neuromuscular re-education, 97535- Self Care, 10960- Manual therapy, [phone]- Gait training, and Patient/Family education  PLAN FOR NEXT SESSION: continue to address extension lag, quad strengthening, functional strengthening and balance training.  Add leg press and continue step down movements.  Minor Amble, LPTA/CLT; CBIS [phone]  Alesia Anchors, PTA 01/12/2024, 11:50 AM  11:50 AM, 01/12/24

## 2024-01-14 ENCOUNTER — Ambulatory Visit (HOSPITAL_COMMUNITY): Payer: Medicare Other

## 2024-01-14 DIAGNOSIS — M25661 Stiffness of right knee, not elsewhere classified: Secondary | ICD-10-CM | POA: Diagnosis not present

## 2024-01-14 DIAGNOSIS — M1711 Unilateral primary osteoarthritis, right knee: Secondary | ICD-10-CM | POA: Diagnosis not present

## 2024-01-14 DIAGNOSIS — M6281 Muscle weakness (generalized): Secondary | ICD-10-CM | POA: Diagnosis not present

## 2024-01-14 DIAGNOSIS — M25561 Pain in right knee: Secondary | ICD-10-CM | POA: Diagnosis not present

## 2024-01-14 NOTE — Therapy (Signed)
OUTPATIENT PHYSICAL THERAPY LOWER EXTREMITY TREATMENT   Patient Name: Antonio Colon MRN: [id number] DOB:August 28, 1951, 73 y.o., male Today's Date: 01/14/2024  END OF SESSION:  PT End of Session - 01/14/24 1021     Visit Number 6    Number of Visits 12    Date for PT Re-Evaluation 02/10/24    Authorization Type [employer]    Authorization Time Period 18v 12/31/23-02/10/2024    PT Start Time 1015    PT Stop Time 1055    PT Time Calculation (min) 40 min    Activity Tolerance Patient tolerated treatment well    Behavior During Therapy [facility] for tasks assessed/performed               Past Medical History:  Diagnosis Date   CAD (coronary artery disease)    stents   Fatty liver    Fracture, rib 05/2021   right side   GERD (gastroesophageal reflux disease)    Heart disease    Heart murmur    childhood   Hiatal hernia    History of stress test 01/27/2012   Normal study. No significant ischemia demonstration, this low risk scan, no significant change compared to previous study.   Hypercholesteremia    Hyperlipidemia    Hypertension    Myocardial infarction (HCC) 2010   Obesity    Pneumonia 2019   Sleep apnea    cpap at night   Past Surgical History:  Procedure Laterality Date   AXILLARY LYMPH NODE DISSECTION Right 07/2021   BACK SURGERY  10/2019   Dr. Franky Macho: right L4-5 laminectomy   BIOPSY  06/02/2022   Procedure: BIOPSY;  Surgeon: Marguerita Merles, Reuel Boom, MD;  [facility]: [facility];  Service: Gastroenterology;;   CARDIAC CATHETERIZATION  10/2009   CAD stents to the LAD using an Endeavor drug eluting (3x23mm) by Dr Clarene Duke, he had normal circumflex and RCA at the time.   COLONOSCOPY  06/15/2012   COLONOSCOPY N/A 01/09/2019   Procedure: COLONOSCOPY;  Surgeon: Malissa Hippo, MD;  [facility]: [facility];  Service: Endoscopy;  Laterality: N/A;  2:10pm   COLONOSCOPY WITH PROPOFOL N/A 06/02/2022   Procedure: COLONOSCOPY WITH PROPOFOL;  Surgeon: Dolores Frame, MD;   [facility]: [facility];  Service: Gastroenterology;  Laterality: N/A;  730 ASA 2   ESOPHAGOGASTRODUODENOSCOPY N/A 11/15/2015   Procedure: ESOPHAGOGASTRODUODENOSCOPY (EGD);  Surgeon: Malissa Hippo, MD;  [facility]: [facility];  Service: Endoscopy;  Laterality: N/A;  1:10 - moved to 12:55 - Ann notified pt   KNEE ARTHROSCOPY WITH MEDIAL MENISECTOMY Right 06/22/2023   Procedure: KNEE ARTHROSCOPY WITH MEDIAL AND LATERAL MENISCECTOMY;  Surgeon: Vickki Hearing, MD;  Location: [facility];  Service: Orthopedics;  Laterality: Right;   MELANOMA EXCISION  07/2021   back   POLYPECTOMY  01/09/2019   Procedure: POLYPECTOMY;  Surgeon: Malissa Hippo, MD;  [facility]: [facility];  Service: Endoscopy;;  colon   POLYPECTOMY  06/02/2022   Procedure: POLYPECTOMY;  Surgeon: Dolores Frame, MD;  [facility]: [facility];  Service: Gastroenterology;;   TONSILLECTOMY     TOTAL KNEE ARTHROPLASTY Right 12/07/2023   Procedure: TOTAL KNEE ARTHROPLASTY;  Surgeon: Vickki Hearing, MD;  Location: [facility];  Service: Orthopedics;  Laterality: Right;   Patient Active Problem List   Diagnosis Date Noted   S/P total knee arthroplasty, right 12/07/23 12/13/2023   Primary osteoarthritis of right knee 06/22/2023   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 06/22/2023  Hx of melanoma excision 03/01/2023   Chronic diarrhea 04/23/2022   Abdominal pain 04/02/2022   Liver lesion, left lobe 10/30/2021   Diverticulosis of colon without hemorrhage 10/30/2021   Calculus of gallbladder without cholecystitis without obstruction 10/30/2021   Spondylolisthesis of lumbar region 07/10/2020   Obstructive sleep apnea 08/26/2018   Respiratory failure (HCC) 06/01/2018   Morbid obesity (HCC) 06/01/2018   NAFLD (nonalcoholic fatty liver disease) 32/95/1884   GERD (gastroesophageal reflux disease) 01/26/2012   CAD S/P percutaneous coronary angioplasty 01/26/2012   Hyperlipemia 01/26/2012   HTN  (hypertension) 01/26/2012   FRACTURE, MEDIAL MALLEOLUS 11/20/2008   Sprain of ankle 11/20/2008    PCP: Nita Sells  REFERRING PROVIDER: Fuller [place]   REFERRING DIAG:  Diagnosis  M17.11 (ICD-10-CM) - Unilateral primary osteoarthritis, right knee    THERAPY DIAG:  Right knee pain, edema, muscle weakness,   Rationale for Evaluation and Treatment: Rehabilitation  ONSET DATE: 12/07/23  SUBJECTIVE:   SUBJECTIVE STATEMENT: Doing well today. Denies any pain.  Eval:  PT states that he has been getting HH which ended last Friday.  The pt states that he can sit for an hour to an hour and a half,  stand for 30 minutes, walking for 30 minutes.  PT states that he is still sleeping in the recliner.    PERTINENT HISTORY: OA PAIN:  Are you having pain? Yes: NPRS scale: 0 ; worst pain 3/10 Pain location: inside  Pain description: aching, throb  Aggravating factors: activity  Relieving factors: ice and meds   PRECAUTIONS: Fall   WEIGHT BEARING RESTRICTIONS: No  FALLS:  Has patient fallen in last 6 months? No  LIVING ENVIRONMENT: Lives with: lives with their family Lives in: House/apartment Stairs: Yes: Internal: 18 steps; on right going up; does not complete reciprocal yet  Has following equipment at home: Single point cane  OCCUPATION: retired part time with city; riding in a truck handling complaints   PLOF: Independent  PATIENT GOALS: decreased pain Improved mobility   NEXT MD VISIT: 01/20/24  OBJECTIVE:  Note: Objective measures were completed at Evaluation unless otherwise noted.   PATIENT SURVEYS:  FOTO 57  COGNITION: Overall cognitive status: Within functional limits for tasks assessed     SENSATION: Not tested  LOWER EXTREMITY ROM:  Active ROM Right eval Left eval Right  01/05/24 Right 01/07/24  Hip flexion      Hip extension      Hip abduction      Hip adduction      Hip internal rotation      Hip external rotation      Knee flexion 105  115  123  Knee extension 14  8 -8 (lacking)  Ankle dorsiflexion      Ankle plantarflexion      Ankle inversion      Ankle eversion       (Blank rows = not tested)  LOWER EXTREMITY MMT:  MMT Right eval Left eval  Hip flexion 4+ 5  Hip extension 3 3  Hip abduction 4 5  Hip adduction    Hip internal rotation    Hip external rotation    Knee flexion 3+ 5  Knee extension 3 5  Ankle dorsiflexion 5 5  Ankle plantarflexion    Ankle inversion    Ankle eversion     (Blank rows = not tested) FUNCTIONAL TESTS:  30 seconds chair stand test:  11 below average; 13 is average  2 minute walk test: 337 ft no assistive  device with significant limp Single leg stance:  Rt:1"  , Lt:  1"                                                                                                                                TREATMENT DATE:  01/14/24: Recumbent bike, seat 14, then 12, level 2, 5' Gastrocnemius slant board stretch x 30" x 3 Seated R hamstring stretch x 30" x 3 R knee drives, 12" black box, with 30" stretch at the beginning of each set x 10 x 3 R step ups, 6" box x 10 x 2, no UE support R side step ups, 4" box, 10 x 2, no UE support R forward step downs, 4" box, 10 x 2 with UE support Walking backwards, RTB x 5 rounds inside // bars Walking over 4" hurdles x 5 rounds inside // bars Body craft Standing R TKE, 3 plates, 3" x 10 x 2  01/12/24: Bike x 5' full revolution seat 14 then 13 Squats front of chair 10x Bodycraft Tke #3 plate Z61 with 3''iso Stairs reciprocal pattern 4in 1Rt, 7in 2RT with 2 HRs, decreased eccentric control descending 4in lateral step up 2x 10 1HR 4in step down 2x 10, improved control with 2 HHA vs 1 SLS 1-2" max BLE  Vector stance 3x 5"  AROM 2-120 degrees Sidestep 4RT inside // bars no HHA GTB around thigh Slant board 2x 30" Hamstring stretch 2x 30" standing 12in step   01/10/2024  -5' recumbent bike seat 13-full revolutions -slant board stretch 2 x  1' -Seated passive weighted R knee extension stretch with R heel on box and 5lb DB with gait belt at proximal patella 2x 1' -Bodycraft Tke #3 plate W96 with 3''iso -6in stepup with single UE support 2 x 10 with cues for anterior trunk shift -2in stepups without UE support 3 x 5 with cues for anterior weight shift of trunk -2in lateral stepups without UE support 2 x 5 with cues for TKE. -4in step-up without UE support x 5 - 10x sit/stand from elevated chair with tidal tank -Education on tandem stance at home with HEP updated -Education regarding hanging heel in recliner for TKE ROM daily.   01/07/24 STM and man ROM to right knee to improve soft tissue extensibility and increase mobility x 10'  AROM right knee in supine -8 to 123 Bike seat 13 x 5' for mobility Standing: Heel raises 2 x 10 Slant board 5 x 20" 4" box step ups 2 x 10 4" lateral step ups 2 x 10 Bodycraft TKE's 3 plates x 30   0/4/54: Reviewed goals Educated importance of HEP compliance for maximal benefits  Supine: Quad sets 10x 5" SAQ 10x5" SLR 10x 2 sets with quad set prior Hamstring stretch 3x 30" Bridge 10x 5"  Prone knee hang 4' with manual to hamstrings  AROM 8-115  Standing: TKE with GTB 10x 5" Slant board 2x 30"  PATIENT  EDUCATION:  Education details: HEP Person educated: Patient Education method: Programmer, multimedia, Verbal cues, and Handouts Education comprehension: returned demonstration  HOME EXERCISE PROGRAM: Access Code: [email or web address] URL: [email or web address] Date: 12/30/2023 Prepared by: Virgina Organ  Exercises - Seated Long Arc Quad  - 3 x daily - 7 x weekly - 1 sets - 10 reps - 5" hold - Quad Setting and Stretching  - 3 x daily - 7 x weekly - 1 sets - 10 reps - 5" hold - Supine Heel Slides  - 2 x daily - 7 x weekly - 1 sets - 10 reps - 5" hold - Heel Raises with Counter Support  - 2 x daily - 7 x weekly - 1 sets - 10 reps - 3-5" hold - Sit to Stand  - 3 x daily - 7 x weekly -  1 sets - 10 reps - 3" hold  01/05/24: - Hooklying Hamstring Stretch with Strap  - 2 x daily - 7 x weekly - 3 sets - 3 reps - 30" hold - Prone Knee Extension Hang  - 2 x daily - 7 x weekly - 1 sets - 1 reps - 5' hold - Supine Bridge  - 2 x daily - 7 x weekly - 2 sets - 10 reps - 5" hold Access Code: [email or web address] URL: [email or web address] Date: 01/10/2024 Prepared by: Starling Manns  Exercises - Tandem Stance with Support  - 1 x daily - 7 x weekly - 3 sets - 10 reps - 30 hold  ASSESSMENT:  CLINICAL IMPRESSION: Interventions today were geared towards LE strengthening, flexibility, and re-educating muscles as well as improving balance. Tolerated all activities without worsening of symptoms. Demonstrated appropriate levels of fatigue. Provided slight amount of cueing to ensure correct execution of activity with good carry-over. To date, skilled PT is required to address the impairments and improve function.    Eval:  Patient is a 73 y.o. male  who was seen today for physical therapy evaluation and treatment for evaluation and treatment for a right total knee which was performed on 12/07/23.  At this time Mr. Bernson has decreased ROM, decreased activity tolerance increased, decreased strength, decreased balance, increased swelling, increased pain and difficulty walking.  Mr. Kocourek will benefit from skilled PT to address these issues and maximize his functional ability. .   OBJECTIVE IMPAIRMENTS: decreased activity tolerance, decreased balance, difficulty walking, decreased ROM, decreased strength, increased edema, and pain.   ACTIVITY LIMITATIONS: carrying, lifting, sitting, standing, squatting, stairs, toileting, and locomotion level  PARTICIPATION LIMITATIONS: meal prep, driving, shopping, community activity, and yard work  PERSONAL FACTORS: Fitness are also affecting patient's functional outcome.   REHAB POTENTIAL: Good  CLINICAL DECISION MAKING: Stable/uncomplicated  EVALUATION  COMPLEXITY: Moderate   GOALS: Goals reviewed with patient? No  SHORT TERM GOALS: Target date: 01/20/24 PT to be I in HEP in order to decrease his pain to no greater than a 1/10 Baseline: Goal status: INITIAL  2.  Pt ROM to be improved to 5-115 to allow pt to ambulate without a limp  Baseline:  Goal status: INITIAL  3.  PT strength to be increased by 1/2 grade to allow pt to go up and down 9 steps in a reciprocal manner.  Baseline:  Goal status: INITIAL  LONG TERM GOALS: Target date: 02/10/24  Pt ROM to be improved to 3-120 to allow pt to squat down to pick items off the floor, sit for over 2 hrs in comfort.  Baseline:  Goal status: INITIAL  2.  PT strength to be increased by 1 grade to allow pt to go up and down 18 steps in a reciprocal manner, return from a squatted position  Baseline:  Goal status: INITIAL  3.  Pt to be able to single leg stance for 10 seconds on both LE to reduce risk of falls.  Baseline:  Goal status: INITIAL    PLAN:  PT FREQUENCY: 3x/week  PT DURATION: 6 weeks  PLANNED INTERVENTIONS: 97110-Therapeutic exercises, 97530- Therapeutic activity, [id number]- Neuromuscular re-education, 97535- Self Care, [id number]- Manual therapy, [phone]- Gait training, and Patient/Family education  PLAN FOR NEXT SESSION: continue to address extension lag, quad strengthening, functional strengthening and balance training.  Add leg press and continue step down movements.  Tish Frederickson. Renell Allum, PT, DPT, OCS Board-Certified Clinical Specialist in Orthopedic PT PT Compact Privilege # ([place]): [id number] T 01/14/2024, 10:23 AM

## 2024-01-17 ENCOUNTER — Ambulatory Visit (HOSPITAL_COMMUNITY): Payer: Medicare Other

## 2024-01-17 DIAGNOSIS — M25561 Pain in right knee: Secondary | ICD-10-CM | POA: Diagnosis not present

## 2024-01-17 DIAGNOSIS — M1711 Unilateral primary osteoarthritis, right knee: Secondary | ICD-10-CM | POA: Diagnosis not present

## 2024-01-17 DIAGNOSIS — M6281 Muscle weakness (generalized): Secondary | ICD-10-CM | POA: Diagnosis not present

## 2024-01-17 DIAGNOSIS — M25661 Stiffness of right knee, not elsewhere classified: Secondary | ICD-10-CM | POA: Diagnosis not present

## 2024-01-17 NOTE — Therapy (Signed)
OUTPATIENT PHYSICAL THERAPY LOWER EXTREMITY TREATMENT   Patient Name: Antonio Colon MRN: [id number] DOB:Jul 05, 1951, 73 y.o., male Today's Date: 01/17/2024  END OF SESSION:  PT End of Session - 01/17/24 1001     Visit Number 7    Number of Visits 12    Date for PT Re-Evaluation 02/10/24    Authorization Type [employer]    Authorization Time Period 18v 12/31/23-02/10/2024    PT Start Time 1001    PT Stop Time 1040    PT Time Calculation (min) 39 min    Activity Tolerance Patient tolerated treatment well    Behavior During Therapy [facility] for tasks assessed/performed               Past Medical History:  Diagnosis Date   CAD (coronary artery disease)    stents   Fatty liver    Fracture, rib 05/2021   right side   GERD (gastroesophageal reflux disease)    Heart disease    Heart murmur    childhood   Hiatal hernia    History of stress test 01/27/2012   Normal study. No significant ischemia demonstration, this low risk scan, no significant change compared to previous study.   Hypercholesteremia    Hyperlipidemia    Hypertension    Myocardial infarction (HCC) 2010   Obesity    Pneumonia 2019   Sleep apnea    cpap at night   Past Surgical History:  Procedure Laterality Date   AXILLARY LYMPH NODE DISSECTION Right 07/2021   BACK SURGERY  10/2019   Dr. Franky Macho: right L4-5 laminectomy   BIOPSY  06/02/2022   Procedure: BIOPSY;  Surgeon: Marguerita Merles, Reuel Boom, MD;  [facility]: [facility];  Service: Gastroenterology;;   CARDIAC CATHETERIZATION  10/2009   CAD stents to the LAD using an Endeavor drug eluting (3x63mm) by Dr Clarene Duke, he had normal circumflex and RCA at the time.   COLONOSCOPY  06/15/2012   COLONOSCOPY N/A 01/09/2019   Procedure: COLONOSCOPY;  Surgeon: Malissa Hippo, MD;  [facility]: [facility];  Service: Endoscopy;  Laterality: N/A;  2:10pm   COLONOSCOPY WITH PROPOFOL N/A 06/02/2022   Procedure: COLONOSCOPY WITH PROPOFOL;  Surgeon: Dolores Frame, MD;   [facility]: [facility];  Service: Gastroenterology;  Laterality: N/A;  730 ASA 2   ESOPHAGOGASTRODUODENOSCOPY N/A 11/15/2015   Procedure: ESOPHAGOGASTRODUODENOSCOPY (EGD);  Surgeon: Malissa Hippo, MD;  [facility]: [facility];  Service: Endoscopy;  Laterality: N/A;  1:10 - moved to 12:55 - Ann notified pt   KNEE ARTHROSCOPY WITH MEDIAL MENISECTOMY Right 06/22/2023   Procedure: KNEE ARTHROSCOPY WITH MEDIAL AND LATERAL MENISCECTOMY;  Surgeon: Vickki Hearing, MD;  Location: [facility];  Service: Orthopedics;  Laterality: Right;   MELANOMA EXCISION  07/2021   back   POLYPECTOMY  01/09/2019   Procedure: POLYPECTOMY;  Surgeon: Malissa Hippo, MD;  [facility]: [facility];  Service: Endoscopy;;  colon   POLYPECTOMY  06/02/2022   Procedure: POLYPECTOMY;  Surgeon: Dolores Frame, MD;  [facility]: [facility];  Service: Gastroenterology;;   TONSILLECTOMY     TOTAL KNEE ARTHROPLASTY Right 12/07/2023   Procedure: TOTAL KNEE ARTHROPLASTY;  Surgeon: Vickki Hearing, MD;  Location: [facility];  Service: Orthopedics;  Laterality: Right;   Patient Active Problem List   Diagnosis Date Noted   S/P total knee arthroplasty, right 12/07/23 12/13/2023   Primary osteoarthritis of right knee 06/22/2023   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 06/22/2023  Hx of melanoma excision 03/01/2023   Chronic diarrhea 04/23/2022   Abdominal pain 04/02/2022   Liver lesion, left lobe 10/30/2021   Diverticulosis of colon without hemorrhage 10/30/2021   Calculus of gallbladder without cholecystitis without obstruction 10/30/2021   Spondylolisthesis of lumbar region 07/10/2020   Obstructive sleep apnea 08/26/2018   Respiratory failure (HCC) 06/01/2018   Morbid obesity (HCC) 06/01/2018   NAFLD (nonalcoholic fatty liver disease) 54/62/7035   GERD (gastroesophageal reflux disease) 01/26/2012   CAD S/P percutaneous coronary angioplasty 01/26/2012   Hyperlipemia 01/26/2012   HTN  (hypertension) 01/26/2012   FRACTURE, MEDIAL MALLEOLUS 11/20/2008   Sprain of ankle 11/20/2008    PCP: Nita Sells  REFERRING PROVIDER: Fuller [place]   REFERRING DIAG:  Diagnosis  M17.11 (ICD-10-CM) - Unilateral primary osteoarthritis, right knee    THERAPY DIAG:  Right knee pain, edema, muscle weakness,   Rationale for Evaluation and Treatment: Rehabilitation  ONSET DATE: 12/07/23  SUBJECTIVE:   SUBJECTIVE STATEMENT: Doing well today. Denies any pain.  Eval:  PT states that he has been getting HH which ended last Friday.  The pt states that he can sit for an hour to an hour and a half,  stand for 30 minutes, walking for 30 minutes.  PT states that he is still sleeping in the recliner.    PERTINENT HISTORY: OA PAIN:  Are you having pain? Yes: NPRS scale: 0 ; worst pain 3/10 Pain location: inside  Pain description: aching, throb  Aggravating factors: activity  Relieving factors: ice and meds   PRECAUTIONS: Fall   WEIGHT BEARING RESTRICTIONS: No  FALLS:  Has patient fallen in last 6 months? No  LIVING ENVIRONMENT: Lives with: lives with their family Lives in: House/apartment Stairs: Yes: Internal: 18 steps; on right going up; does not complete reciprocal yet  Has following equipment at home: Single point cane  OCCUPATION: retired part time with city; riding in a truck handling complaints   PLOF: Independent  PATIENT GOALS: decreased pain Improved mobility   NEXT MD VISIT: 01/20/24  OBJECTIVE:  Note: Objective measures were completed at Evaluation unless otherwise noted.   PATIENT SURVEYS:  FOTO 57  COGNITION: Overall cognitive status: Within functional limits for tasks assessed     SENSATION: Not tested  LOWER EXTREMITY ROM:  Active ROM Right eval Left eval Right  01/05/24 Right 01/07/24  Hip flexion      Hip extension      Hip abduction      Hip adduction      Hip internal rotation      Hip external rotation      Knee flexion 105  115  123  Knee extension 14  8 -8 (lacking)  Ankle dorsiflexion      Ankle plantarflexion      Ankle inversion      Ankle eversion       (Blank rows = not tested)  LOWER EXTREMITY MMT:  MMT Right eval Left eval  Hip flexion 4+ 5  Hip extension 3 3  Hip abduction 4 5  Hip adduction    Hip internal rotation    Hip external rotation    Knee flexion 3+ 5  Knee extension 3 5  Ankle dorsiflexion 5 5  Ankle plantarflexion    Ankle inversion    Ankle eversion     (Blank rows = not tested) FUNCTIONAL TESTS:  30 seconds chair stand test:  11 below average; 13 is average  2 minute walk test: 337 ft no assistive  device with significant limp Single leg stance:  Rt:1"  , Lt:  1"                                                                                                                                TREATMENT DATE 01/17/24 Recumbent bike, seat 14 level 2 x 5' dynamic warm up Heel raises on incline x 20; no UE assist Toe raises on decline x 20 light UE assist Slant board 5 x 20" 6" box step ups x 10 with 1 UE assist Standing hamstring stretch on 6" step 20" x 5 Squat to chair for target 2 x 10 TKE with 3 plates x 20 Tandem stance x 30" each   01/14/24: Recumbent bike, seat 14, then 12, level 2, 5' Gastrocnemius slant board stretch x 30" x 3 Seated R hamstring stretch x 30" x 3 R knee drives, 12" black box, with 30" stretch at the beginning of each set x 10 x 3 R step ups, 6" box x 10 x 2, no UE support R side step ups, 4" box, 10 x 2, no UE support R forward step downs, 4" box, 10 x 2 with UE support Walking backwards, RTB x 5 rounds inside // bars Walking over 4" hurdles x 5 rounds inside // bars Body craft Standing R TKE, 3 plates, 3" x 10 x 2  01/12/24: Bike x 5' full revolution seat 14 then 13 Squats front of chair 10x Bodycraft Tke #3 plate G95 with 3''iso Stairs reciprocal pattern 4in 1Rt, 7in 2RT with 2 HRs, decreased eccentric control descending 4in lateral step up  2x 10 1HR 4in step down 2x 10, improved control with 2 HHA vs 1 SLS 1-2" max BLE  Vector stance 3x 5"  AROM 2-120 degrees Sidestep 4RT inside // bars no HHA GTB around thigh Slant board 2x 30" Hamstring stretch 2x 30" standing 12in step   01/10/2024  -5' recumbent bike seat 13-full revolutions -slant board stretch 2 x 1' -Seated passive weighted R knee extension stretch with R heel on box and 5lb DB with gait belt at proximal patella 2x 1' -Bodycraft Tke #3 plate A21 with 3''iso -6in stepup with single UE support 2 x 10 with cues for anterior trunk shift -2in stepups without UE support 3 x 5 with cues for anterior weight shift of trunk -2in lateral stepups without UE support 2 x 5 with cues for TKE. -4in step-up without UE support x 5 - 10x sit/stand from elevated chair with tidal tank -Education on tandem stance at home with HEP updated -Education regarding hanging heel in recliner for TKE ROM daily.   01/07/24 STM and man ROM to right knee to improve soft tissue extensibility and increase mobility x 10'  AROM right knee in supine -8 to 123 Bike seat 13 x 5' for mobility Standing: Heel raises 2 x 10 Slant board 5 x 20" 4" box step ups 2 x 10  4" lateral step ups 2 x 10 Bodycraft TKE's 3 plates x 30   05/04/83: Reviewed goals Educated importance of HEP compliance for maximal benefits  Supine: Quad sets 10x 5" SAQ 10x5" SLR 10x 2 sets with quad set prior Hamstring stretch 3x 30" Bridge 10x 5"  Prone knee hang 4' with manual to hamstrings  AROM 8-115  Standing: TKE with GTB 10x 5" Slant board 2x 30"  PATIENT EDUCATION:  Education details: HEP Person educated: Patient Education method: Explanation, Verbal cues, and Handouts Education comprehension: returned demonstration  HOME EXERCISE PROGRAM: Access Code: [email or web address] URL: [email or web address] Date: 12/30/2023 Prepared by: Virgina Organ  Exercises - Seated Long Arc Quad  - 3 x daily - 7 x  weekly - 1 sets - 10 reps - 5" hold - Quad Setting and Stretching  - 3 x daily - 7 x weekly - 1 sets - 10 reps - 5" hold - Supine Heel Slides  - 2 x daily - 7 x weekly - 1 sets - 10 reps - 5" hold - Heel Raises with Counter Support  - 2 x daily - 7 x weekly - 1 sets - 10 reps - 3-5" hold - Sit to Stand  - 3 x daily - 7 x weekly - 1 sets - 10 reps - 3" hold  01/05/24: - Hooklying Hamstring Stretch with Strap  - 2 x daily - 7 x weekly - 3 sets - 3 reps - 30" hold - Prone Knee Extension Hang  - 2 x daily - 7 x weekly - 1 sets - 1 reps - 5' hold - Supine Bridge  - 2 x daily - 7 x weekly - 2 sets - 10 reps - 5" hold Access Code: [email or web address] URL: [email or web address] Date: 01/10/2024 Prepared by: Starling Manns  Exercises - Tandem Stance with Support  - 1 x daily - 7 x weekly - 3 sets - 10 reps - 30 hold  ASSESSMENT:  CLINICAL IMPRESSION: Today's session began with warm up on bike; able to make full revolution with first attempt.  Continued with focus on right knee mobility and strength.  Continues with some pulling with hamstring and calf stretching; decreasing antalgic gait noted today.    To date, skilled PT is required to address the impairments and improve function.    Eval:  Patient is a 73 y.o. male  who was seen today for physical therapy evaluation and treatment for evaluation and treatment for a right total knee which was performed on 12/07/23.  At this time Mr. Thedford has decreased ROM, decreased activity tolerance increased, decreased strength, decreased balance, increased swelling, increased pain and difficulty walking.  Mr. Girgis will benefit from skilled PT to address these issues and maximize his functional ability. .   OBJECTIVE IMPAIRMENTS: decreased activity tolerance, decreased balance, difficulty walking, decreased ROM, decreased strength, increased edema, and pain.   ACTIVITY LIMITATIONS: carrying, lifting, sitting, standing, squatting, stairs, toileting, and  locomotion level  PARTICIPATION LIMITATIONS: meal prep, driving, shopping, community activity, and yard work  PERSONAL FACTORS: Fitness are also affecting patient's functional outcome.   REHAB POTENTIAL: Good  CLINICAL DECISION MAKING: Stable/uncomplicated  EVALUATION COMPLEXITY: Moderate   GOALS: Goals reviewed with patient? No  SHORT TERM GOALS: Target date: 01/20/24 PT to be I in HEP in order to decrease his pain to no greater than a 1/10 Baseline: Goal status: INITIAL  2.  Pt ROM to be improved to 5-115 to allow pt to ambulate without a limp  Baseline:  Goal  status: INITIAL  3.  PT strength to be increased by 1/2 grade to allow pt to go up and down 9 steps in a reciprocal manner.  Baseline:  Goal status: INITIAL  LONG TERM GOALS: Target date: 02/10/24  Pt ROM to be improved to 3-120 to allow pt to squat down to pick items off the floor, sit for over 2 hrs in comfort.  Baseline:  Goal status: INITIAL  2.  PT strength to be increased by 1 grade to allow pt to go up and down 18 steps in a reciprocal manner, return from a squatted position  Baseline:  Goal status: INITIAL  3.  Pt to be able to single leg stance for 10 seconds on both LE to reduce risk of falls.  Baseline:  Goal status: INITIAL    PLAN:  PT FREQUENCY: 3x/week  PT DURATION: 6 weeks  PLANNED INTERVENTIONS: 97110-Therapeutic exercises, 97530- Therapeutic activity, [id number]- Neuromuscular re-education, 97535- Self Care, [id number]- Manual therapy, [phone]- Gait training, and Patient/Family education  PLAN FOR NEXT SESSION: continue to address extension lag, quad strengthening, functional strengthening and balance training.  Add leg press and continue step down movements. Sees [facility] Thursday 1/23  10:44 AM, 01/17/24 Kyria Bumgardner Small Bryer Cozzolino MPT [place] [facility] [place] [phone]

## 2024-01-19 ENCOUNTER — Ambulatory Visit (HOSPITAL_COMMUNITY): Payer: Medicare Other

## 2024-01-19 DIAGNOSIS — M6281 Muscle weakness (generalized): Secondary | ICD-10-CM

## 2024-01-19 DIAGNOSIS — M25561 Pain in right knee: Secondary | ICD-10-CM | POA: Diagnosis not present

## 2024-01-19 DIAGNOSIS — M25661 Stiffness of right knee, not elsewhere classified: Secondary | ICD-10-CM

## 2024-01-19 DIAGNOSIS — M1711 Unilateral primary osteoarthritis, right knee: Secondary | ICD-10-CM | POA: Diagnosis not present

## 2024-01-19 NOTE — Therapy (Signed)
OUTPATIENT PHYSICAL THERAPY LOWER EXTREMITY DISCHARGE PHYSICAL THERAPY DISCHARGE SUMMARY  Visits from Start of Care: 8  Current functional level related to goals / functional outcomes: See below   Remaining deficits: See below   Education / Equipment: HEP   Patient agrees to discharge. Patient goals were partially met. Only did not meet goal for balance.  Patient is being discharged due to being pleased with the current functional level.  He is now going to the [facility].      Patient Name: Antonio Colon MRN: [id number] DOB:03-29-1951, 73 y.o., male Today's Date: 01/19/2024  END OF SESSION:  PT End of Session - 01/19/24 1013     Visit Number 8    Number of Visits 12    Date for PT Re-Evaluation 02/10/24    Authorization Type U[employer]    Authorization Time Period 18v 12/31/23-02/10/2024    Progress Note Due on Visit 10    PT Start Time 1015    PT Stop Time 1047    PT Time Calculation (min) 32 min    Activity Tolerance Patient tolerated treatment well    Behavior During Therapy [facility] for tasks assessed/performed               Past Medical History:  Diagnosis Date   CAD (coronary artery disease)    stents   Fatty liver    Fracture, rib 05/2021   right side   GERD (gastroesophageal reflux disease)    Heart disease    Heart murmur    childhood   Hiatal hernia    History of stress test 01/27/2012   Normal study. No significant ischemia demonstration, this low risk scan, no significant change compared to previous study.   Hypercholesteremia    Hyperlipidemia    Hypertension    Myocardial infarction (HCC) 2010   Obesity    Pneumonia 2019   Sleep apnea    cpap at night   Past Surgical History:  Procedure Laterality Date   AXILLARY LYMPH NODE DISSECTION Right 07/2021   BACK SURGERY  10/2019   Dr. Franky Macho: right L4-5 laminectomy   BIOPSY  06/02/2022   Procedure: BIOPSY;  Surgeon: Marguerita Merles, Reuel Boom, MD;  [facility];  Service: Gastroenterology;;    CARDIAC CATHETERIZATION  10/2009   CAD stents to the LAD using an Endeavor drug eluting (3x78mm) by Dr Clarene Duke, he had normal circumflex and RCA at the time.   COLONOSCOPY  06/15/2012   COLONOSCOPY N/A 01/09/2019   Procedure: COLONOSCOPY;  Surgeon: Malissa Hippo, MD;  [facility];  Service: Endoscopy;  Laterality: N/A;  2:10pm   COLONOSCOPY WITH PROPOFOL N/A 06/02/2022   Procedure: COLONOSCOPY WITH PROPOFOL;  Surgeon: Dolores Frame, MD;  [facility];  Service: Gastroenterology;  Laterality: N/A;  730 ASA 2   ESOPHAGOGASTRODUODENOSCOPY N/A 11/15/2015   Procedure: ESOPHAGOGASTRODUODENOSCOPY (EGD);  Surgeon: Malissa Hippo, MD;  [facility];  Service: Endoscopy;  Laterality: N/A;  1:10 - moved to 12:55 - Ann notified pt   KNEE ARTHROSCOPY WITH MEDIAL MENISECTOMY Right 06/22/2023   Procedure: KNEE ARTHROSCOPY WITH MEDIAL AND LATERAL MENISCECTOMY;  Surgeon: Vickki Hearing, MD;  Location: [facility];  Service: Orthopedics;  Laterality: Right;   MELANOMA EXCISION  07/2021   back   POLYPECTOMY  01/09/2019   Procedure: POLYPECTOMY;  Surgeon: Malissa Hippo, MD;  [facility];  Service: Endoscopy;;  colon   POLYPECTOMY  06/02/2022   Procedure: POLYPECTOMY;  Surgeon: Levon Hedger  Alisia Ferrari, MD;  Location: [facility];  Service: Gastroenterology;;   TONSILLECTOMY     TOTAL KNEE ARTHROPLASTY Right 12/07/2023   Procedure: TOTAL KNEE ARTHROPLASTY;  Surgeon: Vickki Hearing, MD;  Location: [facility];  Service: Orthopedics;  Laterality: Right;   Patient Active Problem List   Diagnosis Date Noted   S/P total knee arthroplasty, right 12/07/23 12/13/2023   Primary osteoarthritis of right knee 06/22/2023   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 06/22/2023   Hx of melanoma excision 03/01/2023   Chronic diarrhea 04/23/2022   Abdominal pain 04/02/2022   Liver lesion, left lobe 10/30/2021   Diverticulosis of colon  without hemorrhage 10/30/2021   Calculus of gallbladder without cholecystitis without obstruction 10/30/2021   Spondylolisthesis of lumbar region 07/10/2020   Obstructive sleep apnea 08/26/2018   Respiratory failure (HCC) 06/01/2018   Morbid obesity (HCC) 06/01/2018   NAFLD (nonalcoholic fatty liver disease) 09/81/1914   GERD (gastroesophageal reflux disease) 01/26/2012   CAD S/P percutaneous coronary angioplasty 01/26/2012   Hyperlipemia 01/26/2012   HTN (hypertension) 01/26/2012   FRACTURE, MEDIAL MALLEOLUS 11/20/2008   Sprain of ankle 11/20/2008    PCP: Nita Sells  REFERRING PROVIDER: Fuller Canada   REFERRING DIAG:  Diagnosis  M17.11 (ICD-10-CM) - Unilateral primary osteoarthritis, right knee    THERAPY DIAG:  Right knee pain, edema, muscle weakness,   Rationale for Evaluation and Treatment: Rehabilitation  ONSET DATE: 12/07/23  SUBJECTIVE:   SUBJECTIVE STATEMENT: Patient feels he is almost "100%" better; he is doing well with going up and down the steps and with showering; went to the [facility] yesterday; he did have hard time trying to do the bike there but it was more of an upright bike.  Sees surgeon tomorrow  Eval:  PT states that he has been getting HH which ended last Friday.  The pt states that he can sit for an hour to an hour and a half,  stand for 30 minutes, walking for 30 minutes.  PT states that he is still sleeping in the recliner.    PERTINENT HISTORY: OA PAIN:  Are you having pain? Yes: NPRS scale: 0 ; worst pain 3/10 Pain location: inside  Pain description: aching, throb  Aggravating factors: activity  Relieving factors: ice and meds   PRECAUTIONS: Fall   WEIGHT BEARING RESTRICTIONS: No  FALLS:  Has patient fallen in last 6 months? No  LIVING ENVIRONMENT: Lives with: lives with their family Lives in: House/apartment Stairs: Yes: Internal: 18 steps; on right going up; does not complete reciprocal yet  Has following equipment at home: Single  point cane  OCCUPATION: retired part time with city; riding in a truck handling complaints   PLOF: Independent  PATIENT GOALS: decreased pain Improved mobility   NEXT MD VISIT: 01/20/24  OBJECTIVE:  Note: Objective measures were completed at Evaluation unless otherwise noted.   PATIENT SURVEYS:  FOTO 57  COGNITION: Overall cognitive status: Within functional limits for tasks assessed     SENSATION: Not tested  LOWER EXTREMITY ROM:  Active ROM Right eval Left eval Right  01/05/24 Right 01/07/24 Right 01/19/24  Hip flexion       Hip extension       Hip abduction       Hip adduction       Hip internal rotation       Hip external rotation       Knee flexion 105  115 123 128  Knee extension 14  8 -8 (lacking) -4  Ankle dorsiflexion       Ankle plantarflexion       Ankle inversion       Ankle eversion        (Blank rows = not tested)  LOWER EXTREMITY MMT:  MMT Right eval Left eval Right 01/19/24  Hip flexion 4+ 5 4+  Hip extension 3 3 4   Hip abduction 4 5 5   Hip adduction     Hip internal rotation     Hip external rotation     Knee flexion 3+ 5 5  Knee extension 3 5 4+  Ankle dorsiflexion 5 5 5   Ankle plantarflexion     Ankle inversion     Ankle eversion      (Blank rows = not tested) FUNCTIONAL TESTS:  30 seconds chair stand test:  11 below average; 13 is average  2 minute walk test: 337 ft no assistive device with significant limp Single leg stance:  Rt:1"  , Lt:  1"                                                                                                                                TREATMENT DATE 01/19/24 Recumbent bike, seat 14 level 2 x 5' dynamic warm up Progress note  FOTO 79 MMT's and AROM see above 30 sec sit to stand 18 no UE assist 2 MWT without AD 388 ft slight antalgic gait SLS 3" max right leg and right  01/17/24 Recumbent bike, seat 14 level 2 x 5' dynamic warm up Heel raises on incline x 20; no UE assist Toe raises on  decline x 20 light UE assist Slant board 5 x 20" 6" box step ups x 10 with 1 UE assist Standing hamstring stretch on 6" step 20" x 5 Squat to chair for target 2 x 10 TKE with 3 plates x 20 Tandem stance x 30" each   01/14/24: Recumbent bike, seat 14, then 12, level 2, 5' Gastrocnemius slant board stretch x 30" x 3 Seated R hamstring stretch x 30" x 3 R knee drives, 12" black box, with 30" stretch at the beginning of each set x 10 x 3 R step ups, 6" box x 10 x 2, no UE support R side step ups, 4" box, 10 x 2, no UE support R forward step downs, 4" box, 10 x 2 with UE support Walking backwards, RTB x 5 rounds inside // bars Walking over 4" hurdles x 5 rounds inside // bars Body craft Standing R TKE, 3 plates, 3" x 10 x 2  01/12/24: Bike x 5' full revolution seat 14 then 13 Squats front of chair 10x Bodycraft Tke #3 plate Y86 with 3''iso Stairs reciprocal pattern 4in 1Rt, 7in 2RT with 2 HRs, decreased eccentric control descending 4in lateral step up 2x 10 1HR 4in step down 2x 10, improved control with 2 HHA vs 1 SLS 1-2" max BLE  Vector stance 3x 5"  AROM 2-120 degrees Sidestep 4RT inside // bars no HHA GTB around thigh Slant board 2x 30" Hamstring stretch 2x 30" standing 12in step   01/10/2024  -5' recumbent bike seat 13-full revolutions -slant board stretch 2 x 1' -Seated passive weighted R knee extension stretch with R heel on box and 5lb DB with gait belt at proximal patella 2x 1' -Bodycraft Tke #3 plate W09 with 3''iso -6in stepup with single UE support 2 x 10 with cues for anterior trunk shift -2in stepups without UE support 3 x 5 with cues for anterior weight shift of trunk -2in lateral stepups without UE support 2 x 5 with cues for TKE. -4in step-up without UE support x 5 - 10x sit/stand from elevated chair with tidal tank -Education on tandem stance at home with HEP updated -Education regarding hanging heel in recliner for TKE ROM daily.   01/07/24 STM and man ROM  to right knee to improve soft tissue extensibility and increase mobility x 10'  AROM right knee in supine -8 to 123 Bike seat 13 x 5' for mobility Standing: Heel raises 2 x 10 Slant board 5 x 20" 4" box step ups 2 x 10 4" lateral step ups 2 x 10 Bodycraft TKE's 3 plates x 30   07/28/18: Reviewed goals Educated importance of HEP compliance for maximal benefits  Supine: Quad sets 10x 5" SAQ 10x5" SLR 10x 2 sets with quad set prior Hamstring stretch 3x 30" Bridge 10x 5"  Prone knee hang 4' with manual to hamstrings  AROM 8-115  Standing: TKE with GTB 10x 5" Slant board 2x 30"  PATIENT EDUCATION:  Education details: HEP Person educated: Patient Education method: Explanation, Verbal cues, and Handouts Education comprehension: returned demonstration  HOME EXERCISE PROGRAM: Access Code: [email or web address] URL: [email or web address] Date: 12/30/2023 Prepared by: Virgina Organ  Exercises - Seated Long Arc Quad  - 3 x daily - 7 x weekly - 1 sets - 10 reps - 5" hold - Quad Setting and Stretching  - 3 x daily - 7 x weekly - 1 sets - 10 reps - 5" hold - Supine Heel Slides  - 2 x daily - 7 x weekly - 1 sets - 10 reps - 5" hold - Heel Raises with Counter Support  - 2 x daily - 7 x weekly - 1 sets - 10 reps - 3-5" hold - Sit to Stand  - 3 x daily - 7 x weekly - 1 sets - 10 reps - 3" hold  01/05/24: - Hooklying Hamstring Stretch with Strap  - 2 x daily - 7 x weekly - 3 sets - 3 reps - 30" hold - Prone Knee Extension Hang  - 2 x daily - 7 x weekly - 1 sets - 1 reps - 5' hold - Supine Bridge  - 2 x daily - 7 x weekly - 2 sets - 10 reps - 5" hold Access Code: [email or web address] URL: [email or web address] Date: 01/10/2024 Prepared by: Starling Manns  Exercises - Tandem Stance with Support  - 1 x daily - 7 x weekly - 3 sets - 10 reps - 30 hold  ASSESSMENT:  CLINICAL IMPRESSION: Progress note today; patient with good improvement with all functional testing and FOTO.  He  has met all set rehab goals except goal for balance but he also has some balance issue on his non surgical side.  Patient is agreeable to discharge at this time.      Eval:  Patient is  a 73 y.o. male  who was seen today for physical therapy evaluation and treatment for evaluation and treatment for a right total knee which was performed on 12/07/23.  At this time Mr. Salami has decreased ROM, decreased activity tolerance increased, decreased strength, decreased balance, increased swelling, increased pain and difficulty walking.  Mr. Reynaga will benefit from skilled PT to address these issues and maximize his functional ability. .   OBJECTIVE IMPAIRMENTS: decreased activity tolerance, decreased balance, difficulty walking, decreased ROM, decreased strength, increased edema, and pain.   ACTIVITY LIMITATIONS: carrying, lifting, sitting, standing, squatting, stairs, toileting, and locomotion level  PARTICIPATION LIMITATIONS: meal prep, driving, shopping, community activity, and yard work  PERSONAL FACTORS: Fitness are also affecting patient's functional outcome.   REHAB POTENTIAL: Good  CLINICAL DECISION MAKING: Stable/uncomplicated  EVALUATION COMPLEXITY: Moderate   GOALS: Goals reviewed with patient? No  SHORT TERM GOALS: Target date: 01/20/24 PT to be I in HEP in order to decrease his pain to no greater than a 1/10 Baseline: Goal status: met  2.  Pt ROM to be improved to 5-115 to allow pt to ambulate without a limp  Baseline:  Goal status: met  3.  PT strength to be increased by 1/2 grade to allow pt to go up and down 9 steps in a reciprocal manner.  Baseline:  Goal status:met  LONG TERM GOALS: Target date: 02/10/24  Pt ROM to be improved to 3-120 to allow pt to squat down to pick items off the floor, sit for over 2 hrs in comfort.  Baseline:  Goal status: met  2.  PT strength to be increased by 1 grade to allow pt to go up and down 18 steps in a reciprocal manner, return from a  squatted position  Baseline:  Goal status: met  3.  Pt to be able to single leg stance for 10 seconds on both LE to reduce risk of falls.  Baseline: right leg 2-4" Goal status: in progress     PLAN:  PT FREQUENCY: 3x/week  PT DURATION: 6 weeks  PLANNED INTERVENTIONS: 97110-Therapeutic exercises, 97530- Therapeutic activity, [id number]- Neuromuscular re-education, 97535- Self Care, 69629- Manual therapy, [phone]- Gait training, and Patient/Family education  PLAN FOR NEXT SESSION: discharge; Earnstine Regal Thursday 1/23  10:48 AM, 01/19/24 Jerrie Schussler Small Asia Dusenbury MPT [place] [facility] [place] [phone]

## 2024-01-19 NOTE — Progress Notes (Unsigned)
   There were no vitals taken for this visit.  There is no height or weight on file to calculate BMI.  No chief complaint on file.   Encounter Diagnosis  Name Primary?   S/P total knee arthroplasty, right 12/07/23 Yes    DOI/DOS/ Date: 12/07/23  {CHL AMB ORT SYMPTOMS POST TREATMENT:21798}

## 2024-01-20 ENCOUNTER — Ambulatory Visit: Payer: Medicare Other | Admitting: Orthopedic Surgery

## 2024-01-20 DIAGNOSIS — Z96651 Presence of right artificial knee joint: Secondary | ICD-10-CM

## 2024-01-20 NOTE — Progress Notes (Signed)
Post op appt   Chief Complaint  Patient presents with   Post-op Follow-up    Right knee replacement improving has finished PT    Chief Complaint  Patient presents with   Post-op Follow-up      Right knee replacement improving has finished PT           Encounter Diagnosis  Name Primary?   S/P total knee arthroplasty, right 12/07/23 Yes      DOI/DOS/ Date: 12/07/23   Improved   Keymari has made excellent progress  He has some progress do not need a cane he is ready to go back to work his pain is improved his range of motion is 125 degrees follow-up in a year

## 2024-01-21 ENCOUNTER — Encounter (HOSPITAL_COMMUNITY): Payer: Medicare Other

## 2024-01-24 ENCOUNTER — Encounter (HOSPITAL_COMMUNITY): Payer: Medicare Other

## 2024-01-26 ENCOUNTER — Encounter (HOSPITAL_COMMUNITY): Payer: Medicare Other

## 2024-01-28 ENCOUNTER — Encounter (HOSPITAL_COMMUNITY): Payer: Medicare Other

## 2024-01-31 ENCOUNTER — Encounter (HOSPITAL_COMMUNITY): Payer: Medicare Other

## 2024-02-02 ENCOUNTER — Encounter (HOSPITAL_COMMUNITY): Payer: Medicare Other

## 2024-02-04 ENCOUNTER — Encounter (HOSPITAL_COMMUNITY): Payer: Medicare Other

## 2024-02-07 ENCOUNTER — Telehealth: Payer: Self-pay | Admitting: Orthopedic Surgery

## 2024-02-07 ENCOUNTER — Encounter (HOSPITAL_COMMUNITY): Payer: Medicare Other

## 2024-02-07 DIAGNOSIS — R059 Cough, unspecified: Secondary | ICD-10-CM | POA: Diagnosis not present

## 2024-02-07 NOTE — Telephone Encounter (Signed)
 Antonio Colon

## 2024-02-08 ENCOUNTER — Ambulatory Visit: Payer: Medicare Other | Admitting: Cardiovascular Disease

## 2024-02-09 ENCOUNTER — Ambulatory Visit: Payer: Medicare Other | Admitting: Cardiovascular Disease

## 2024-02-09 ENCOUNTER — Encounter (HOSPITAL_COMMUNITY): Payer: Medicare Other

## 2024-02-10 ENCOUNTER — Ambulatory Visit (HOSPITAL_COMMUNITY)
Admission: RE | Admit: 2024-02-10 | Discharge: 2024-02-10 | Disposition: A | Discharge status: Home / Self Care | Payer: Medicare Other | Source: Ambulatory Visit | Attending: Nurse Practitioner | Admitting: Nurse Practitioner

## 2024-02-10 ENCOUNTER — Encounter: Payer: Medicare Other | Admitting: Orthopedic Surgery

## 2024-02-10 DIAGNOSIS — Z96651 Presence of right artificial knee joint: Secondary | ICD-10-CM

## 2024-02-10 DIAGNOSIS — I719 Aortic aneurysm of unspecified site, without rupture: Secondary | ICD-10-CM | POA: Insufficient documentation

## 2024-02-10 DIAGNOSIS — K8020 Calculus of gallbladder without cholecystitis without obstruction: Secondary | ICD-10-CM | POA: Diagnosis not present

## 2024-02-10 DIAGNOSIS — I712 Thoracic aortic aneurysm, without rupture, unspecified: Secondary | ICD-10-CM | POA: Diagnosis not present

## 2024-02-10 DIAGNOSIS — I700 Atherosclerosis of aorta: Secondary | ICD-10-CM | POA: Diagnosis not present

## 2024-02-10 DIAGNOSIS — I2510 Atherosclerotic heart disease of native coronary artery without angina pectoris: Secondary | ICD-10-CM | POA: Diagnosis not present

## 2024-02-10 MED ORDER — IOHEXOL 350 MG/ML SOLN
75.0000 mL | Freq: Once | INTRAVENOUS | Status: AC | PRN
  Administered 2024-02-10: 75 mL via INTRAVENOUS

## 2024-02-11 ENCOUNTER — Encounter (HOSPITAL_COMMUNITY): Payer: Medicare Other

## 2024-02-14 ENCOUNTER — Encounter: Payer: Self-pay | Admitting: Orthopedic Surgery

## 2024-02-14 ENCOUNTER — Ambulatory Visit: Payer: Medicare Other | Admitting: Orthopedic Surgery

## 2024-02-14 VITALS — BP 162/80 | HR 70 | Ht 70.0 in | Wt 270.0 lb

## 2024-02-14 DIAGNOSIS — M25551 Pain in right hip: Secondary | ICD-10-CM

## 2024-02-14 DIAGNOSIS — M25559 Pain in unspecified hip: Secondary | ICD-10-CM

## 2024-02-14 MED ORDER — METHYLPREDNISOLONE ACETATE 40 MG/ML IJ SUSP
40.0000 mg | Freq: Once | INTRAMUSCULAR | Status: AC
  Administered 2024-02-14: 40 mg via INTRA_ARTICULAR

## 2024-02-14 MED ORDER — METHYLPREDNISOLONE ACETATE 40 MG/ML IJ SUSP: 40.0000 mg | Freq: Once | INTRAMUSCULAR | Status: AC

## 2024-02-14 NOTE — Progress Notes (Signed)
 Patient ID: Antonio Colon, male   DOB: Oct 14, 1951, 73 y.o.   MRN: [id number]  73 year old male status post successful right total knee December 07, 2023 comes in with right hip pain  Patient says his pain is over the right posterior greater trochanter and some what up towards the iliac crest  Denies any trauma total knee went well  Exam shows tenderness over the posterior aspect of the greater trochanter Normal range of motion of the hip Nontender spine  Denies any numbness or tingling in the right hip or leg  Encounter Diagnosis  Name Primary?   Greater trochanteric pain syndrome Yes    Frontal diagnosis includes greater trochanteric bursitis, piriformis syndrome, referred pain from lumbar spine, compensatory pain from new walking pattern from total knee replacement  We did do an injection  Follow-up in 2 weeks if no improvement x-ray back and hip   Procedure note injection for trochanteric pain right hip  Verbal consent was obtained for injection of the right hip  Timeout was completed to confirm the injection site  The medications used were 40 mg of Depo-Medrol and 1% lidocaine 3 cc  Anesthesia was provided by ethyl chloride and the skin was prepped with alcohol.  After cleaning the skin with alcohol a 25-gauge needle was used to inject the 1 of maximal tenderness just posterior to the greater trochanter    No complications were noted

## 2024-02-14 NOTE — Progress Notes (Signed)
  Intake history:  BP (!) 162/80   Pulse 70   Ht 5\' 10"  (1.778 m)   Wt 270 lb (122.5 kg)   BMI 38.74 kg/m  Body mass index is 38.74 kg/m.    WHAT ARE WE SEEING YOU FOR TODAY?   back - Right buttock/ hip area   Radiation?: stays in right buttock/ hip.   Loss of bowel/urine control?  no  How long has this bothered you? (DOI?DOS?WS?)  on couple weeks   Anticoag.  No  Diabetes No  Heart disease Yes  Hypertension Yes  SMOKING HX No  Kidney disease No  Any ALLERGIES ______________________________________________   Treatment:  Have you taken:  Tylenol No  Advil No  Had PT No  Had injection No  Other  _________________Aleve________

## 2024-02-15 ENCOUNTER — Other Ambulatory Visit: Payer: Self-pay | Admitting: Cardiovascular Disease

## 2024-02-28 ENCOUNTER — Other Ambulatory Visit (INDEPENDENT_AMBULATORY_CARE_PROVIDER_SITE_OTHER)

## 2024-02-28 ENCOUNTER — Ambulatory Visit: Payer: Medicare Other | Admitting: Orthopedic Surgery

## 2024-02-28 DIAGNOSIS — M25559 Pain in unspecified hip: Secondary | ICD-10-CM

## 2024-02-28 DIAGNOSIS — M545 Low back pain, unspecified: Secondary | ICD-10-CM | POA: Diagnosis not present

## 2024-02-28 MED ORDER — GABAPENTIN 100 MG PO CAPS: 100.0000 mg | ORAL_CAPSULE | Freq: Two times a day (BID) | ORAL | 2 refills | Status: DC

## 2024-02-28 NOTE — Progress Notes (Signed)
   There were no vitals taken for this visit.  There is no height or weight on file to calculate BMI.  Chief Complaint  Patient presents with   Hip Pain    2 week follow up right hip pain  patient says it is not getting any better woke him up at night a few times during the week     Office Visit Note   Patient: Antonio Colon           Date of Birth: 06-12-1951           MRN: [id number] Visit Date: 02/28/2024 Requested by: Benita Stabile, MD [address] [place],  [place] [address] PCP: Benita Stabile, MD

## 2024-02-28 NOTE — Progress Notes (Signed)
   There were no vitals taken for this visit.  There is no height or weight on file to calculate BMI.  Chief Complaint  Patient presents with   Hip Pain    2 week follow up right hip pain  patient says it is not getting any better woke him up at night a few times during the week     Office Visit Note   Patient: Antonio Colon           Date of Birth: May 28, 1951           MRN: [id number] Visit Date: 02/28/2024 Requested by: Benita Stabile, MD [address] [place],  [place] [address] PCP: Benita Stabile, MD  02/14/24  73 year old male status post successful right total knee December 07, 2023 comes in with right hip pain   Patient says his pain is over the right posterior greater trochanter and some what up towards the iliac crest   Denies any trauma total knee went well   Exam shows tenderness over the posterior aspect of the greater trochanter Normal range of motion of the hip Nontender spine  We tried an injection and it only worked for a few days   He still having pain in the buttock and area near the greater trochanter.  Imaging was obtained  DG Lumbar Spine 2-3 Views Result Date: 02/28/2024 Acute onset of back pain after total knee surgery Patient has lumbar fusion at L5-S1 facet arthritis above the fusion multilevel disc disease above the fusion as well Impression fusion L5-S1 facet arthritis and multiple levels of degenerative disc disease above the fusion     Assessment and plan  Encounter Diagnoses  Name Primary?   Lumbar pain Yes   Greater trochanteric pain syndrome     Unclear etiology but I think the realignment of the limb has affected the back and is having some pain referred from the back to the hip area  Recommend gabapentin 100 mg twice daily return 2 weeks if no improvement MRI and consult neurosurgery for possible injection

## 2024-02-29 ENCOUNTER — Inpatient Hospital Stay: Payer: Medicare Other | Attending: Internal Medicine

## 2024-02-29 ENCOUNTER — Inpatient Hospital Stay: Payer: Medicare Other | Admitting: Internal Medicine

## 2024-03-09 ENCOUNTER — Ambulatory Visit: Payer: Medicare Other | Admitting: Cardiovascular Disease

## 2024-03-13 ENCOUNTER — Ambulatory Visit: Admitting: Orthopedic Surgery

## 2024-03-13 DIAGNOSIS — M5410 Radiculopathy, site unspecified: Secondary | ICD-10-CM | POA: Diagnosis not present

## 2024-03-13 DIAGNOSIS — Z96651 Presence of right artificial knee joint: Secondary | ICD-10-CM | POA: Diagnosis not present

## 2024-03-13 NOTE — Progress Notes (Signed)
 Chief Complaint  Patient presents with   Follow-up    Recheck on right knee, DOS 12-07-23.Recheck back.   Encounter Diagnoses  Name Primary?   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  Patient says he is improved with the gabapentin  I suspect that the realignment of his right lower extremity has contributed to his radicular pain in his right leg  Continues to do well with his total knee with no symptoms  Recommend continue gabapentin return for annual x-rays on his right total knee

## 2024-03-14 ENCOUNTER — Encounter: Payer: Self-pay | Admitting: Cardiovascular Disease

## 2024-03-14 ENCOUNTER — Ambulatory Visit: Payer: Medicare Other | Attending: Cardiovascular Disease | Admitting: Cardiovascular Disease

## 2024-03-14 VITALS — BP 144/70 | HR 65 | Ht 70.0 in | Wt 267.0 lb

## 2024-03-14 DIAGNOSIS — I10 Essential (primary) hypertension: Secondary | ICD-10-CM

## 2024-03-14 DIAGNOSIS — I2510 Atherosclerotic heart disease of native coronary artery without angina pectoris: Secondary | ICD-10-CM | POA: Diagnosis not present

## 2024-03-14 DIAGNOSIS — Z9861 Coronary angioplasty status: Secondary | ICD-10-CM

## 2024-03-14 DIAGNOSIS — G4733 Obstructive sleep apnea (adult) (pediatric): Secondary | ICD-10-CM | POA: Diagnosis not present

## 2024-03-14 DIAGNOSIS — E782 Mixed hyperlipidemia: Secondary | ICD-10-CM

## 2024-03-14 NOTE — Assessment & Plan Note (Signed)
 History of hyperlipidemia on low-dose rosuvastatin with lipid profile performed 10/20/2023 revealing total cholesterol 102, LDL 52 and HDL 38.

## 2024-03-14 NOTE — Assessment & Plan Note (Signed)
 On CPAP. ?

## 2024-03-14 NOTE — Assessment & Plan Note (Signed)
 History of morbid obesity with a current BMI of 38 down from 47.  He is lost about 70 pounds in the last year as a result of diet and exercise.

## 2024-03-14 NOTE — Assessment & Plan Note (Signed)
 History of CAD status post stenting of his LAD with Endeavor drug-eluting stent (3 mm and 18 mm) by Dr. Mindi Junker. [place] November 2010.  He had normal circumflex and RCA at that time.  He had a subsequent negative Myoview stress test.  He denies chest pain or shortness of breath.

## 2024-03-14 NOTE — Assessment & Plan Note (Signed)
 History of essential hypertension blood pressure measured today at 144/70.  He is on Bystolic.

## 2024-03-14 NOTE — Progress Notes (Signed)
 03/14/2024 Antonio Colon   Nov 08, 1951  [id number]  Primary Physician Benita Stabile, MD Primary Cardiologist: Runell Gess MD Nicholes Calamity, [place]  HPI:  Antonio Colon is a 73 y.o.  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 Currently he works addressing nuisance calls for the city.  I last saw him in the office 01/05/2023. He has a history of CAD status post LAD stenting using an Endeavor drug eluting stent (3 x 18) by Dr. Caprice Kluver in November 2010. He had normal circumflex and RCA at that time. His other problems include: Remote tobacco abuse, treated hypertension, and dyslipidemia. He saw Dr. Italy Hilty in the office on January 21, 2012, complaining of chest pain, and a Myoview stress test performed a week later showed no ischemia. He does have a history of GERD. He has had no recurrent symptoms.Dr. Juanetta Gosling follows his lipid profile closely.  He was working out at [employer] on the bicycle and treadmill however since he has had some back issues since I last saw him he now does exercise in the pool.   Since I saw him in the office a year ago he continues to do well.  He denies chest pain or shortness of breath.  He is lost over 70 pounds since I saw him last as a result of diet and exercise.  He had a successful right total knee replacement by Dr. Romeo Apple in [place] 12/07/2019 for which he has recovered from.   Current Meds  Medication Sig   ALPRAZolam (XANAX) 0.25 MG tablet Take 0.25 mg by mouth at bedtime.   aspirin EC 81 MG tablet Take 1 tablet (81 mg total) by mouth daily.   gabapentin (NEURONTIN) 100 MG capsule Take 1 capsule (100 mg total) by mouth 2 (two) times daily.   losartan-hydrochlorothiazide (HYZAAR) 100-12.5 MG tablet Take 1 tablet by mouth daily.   methocarbamol (ROBAXIN) 500 MG tablet Take 1 tablet (500 mg total) by mouth every 6 (six) hours as  needed for muscle spasms.   Multiple Vitamin (MULTIVITAMIN WITH MINERALS) TABS tablet Take 1 tablet by mouth daily.   MYRBETRIQ 50 MG TB24 tablet Take 50 mg by mouth daily.    nebivolol (BYSTOLIC) 5 MG tablet TAKE 1 TABLET BY MOUTH ONCE DAILY.   nitroGLYCERIN (NITROSTAT) 0.4 MG SL tablet Place 1 tablet (0.4 mg total) under the tongue every 5 (five) minutes as needed. For chest pain   rosuvastatin (CRESTOR) 5 MG tablet TAKE 1 TABLET BY MOUTH DAILY AT 6PM FOR CHOLESTEROL. (Patient taking differently: Take 5 mg by mouth daily.)   traMADol (ULTRAM) 50 MG tablet Take 1 tablet (50 mg total) by mouth every 6 (six) hours.   Current Facility-Administered Medications for the 03/14/24 encounter (Office Visit) with Runell Gess, MD  Medication   bupivacaine-meloxicam ER (ZYNRELEF) injection 400 mg   methylPREDNISolone acetate (DEPO-MEDROL) injection 40 mg     No Known Allergies  Social History   Socioeconomic History   Marital status: Married    Spouse name: Not on file   Number of children: Not on file   Years of education: Not on file   Highest education level: Not on file  Occupational History   Not on file  Tobacco Use   Smoking status: Former    Current packs/day: 0.00    Types: Cigarettes, Cigars    Quit date:  01/25/2009    Years since quitting: 15.1    Passive exposure: Past   Smokeless tobacco: Never  Vaping Use   Vaping status: Never Used  Substance and Sexual Activity   Alcohol use: No   Drug use: No   Sexual activity: Not on file  Other Topics Concern   Not on file  Social History Narrative   Not on file   Social Drivers of Health   Financial Resource Strain: Not on file  Food Insecurity: No Food Insecurity (12/07/2023)   Hunger Vital Sign    Worried About Running Out of Food in the Last Year: Never true    Ran Out of Food in the Last Year: Never true  Transportation Needs: No Transportation Needs (12/07/2023)   PRAPARE - Scientist, research (physical sciences) (Medical): No    Lack of Transportation (Non-Medical): No  Physical Activity: Not on file  Stress: Not on file  Social Connections: Not on file  Intimate Partner Violence: Not At Risk (12/07/2023)   Humiliation, Afraid, Rape, and Kick questionnaire    Fear of Current or Ex-Partner: No    Emotionally Abused: No    Physically Abused: No    Sexually Abused: No     Review of Systems: General: negative for chills, fever, night sweats or weight changes.  Cardiovascular: negative for chest pain, dyspnea on exertion, edema, orthopnea, palpitations, paroxysmal nocturnal dyspnea or shortness of breath Dermatological: negative for rash Respiratory: negative for cough or wheezing Urologic: negative for hematuria Abdominal: negative for nausea, vomiting, diarrhea, bright red blood per rectum, melena, or hematemesis Neurologic: negative for visual changes, syncope, or dizziness All other systems reviewed and are otherwise negative except as noted above.    Blood pressure (!) 144/70, pulse 65, height 5\' 10"  (1.778 m), weight 267 lb (121.1 kg), SpO2 98%.  General appearance: alert and no distress Neck: no adenopathy, no carotid bruit, no JVD, supple, symmetrical, trachea midline, and thyroid not enlarged, symmetric, no tenderness/mass/nodules Lungs: clear to auscultation bilaterally Heart: regular rate and rhythm, S1, S2 normal, no murmur, click, rub or gallop Extremities: extremities normal, atraumatic, no cyanosis or edema Pulses: 2+ and symmetric Skin: Skin color, texture, turgor normal. No rashes or lesions Neurologic: Grossly normal  EKG not performed today      ASSESSMENT AND PLAN:   CAD S/P percutaneous coronary angioplasty History of CAD status post stenting of his LAD with Endeavor drug-eluting stent (3 mm and 18 mm) by Dr. Mindi Junker. [place] November 2010.  He had normal circumflex and RCA at that time.  He had a subsequent negative Myoview stress test.  He denies chest pain  or shortness of breath.  Hyperlipemia History of hyperlipidemia on low-dose rosuvastatin with lipid profile performed 10/20/2023 revealing total cholesterol 102, LDL 52 and HDL 38.  HTN (hypertension) History of essential hypertension blood pressure measured today at 144/70.  He is on Bystolic.  Morbid obesity (HCC) History of morbid obesity with a current BMI of 38 down from 47.  He is lost about 70 pounds in the last year as a result of diet and exercise.  Obstructive sleep apnea On CPAP.     Runell Gess MD FACP,FACC,FAHA, [facility] 03/14/2024 10:10 AM

## 2024-03-14 NOTE — Patient Instructions (Signed)

## 2024-03-18 ENCOUNTER — Other Ambulatory Visit: Payer: Self-pay | Admitting: Cardiovascular Disease

## 2024-03-21 ENCOUNTER — Other Ambulatory Visit: Payer: Self-pay | Admitting: Cardiovascular Disease

## 2024-04-17 ENCOUNTER — Telehealth: Payer: Self-pay | Admitting: Orthopedic Surgery

## 2024-04-17 NOTE — Telephone Encounter (Signed)
 Dr. Delfino Fellers pt - spoke w/the pt, he stated Saturday he couldn't hardly walk, yesterday and today is better.  He wanted to see if Dr. Marvina Slough thinks he needs to see him or if he needs to contact the doctor that did his back surgery years ago.  [phone]

## 2024-04-21 DIAGNOSIS — I2510 Atherosclerotic heart disease of native coronary artery without angina pectoris: Secondary | ICD-10-CM | POA: Diagnosis not present

## 2024-04-21 DIAGNOSIS — R7301 Impaired fasting glucose: Secondary | ICD-10-CM | POA: Diagnosis not present

## 2024-04-22 LAB — LAB REPORT - SCANNED
A1c: 5.3
EGFR: 90

## 2024-04-26 DIAGNOSIS — N3281 Overactive bladder: Secondary | ICD-10-CM | POA: Diagnosis not present

## 2024-04-26 DIAGNOSIS — I2510 Atherosclerotic heart disease of native coronary artery without angina pectoris: Secondary | ICD-10-CM | POA: Diagnosis not present

## 2024-04-26 DIAGNOSIS — I10 Essential (primary) hypertension: Secondary | ICD-10-CM | POA: Diagnosis not present

## 2024-04-26 DIAGNOSIS — E785 Hyperlipidemia, unspecified: Secondary | ICD-10-CM | POA: Diagnosis not present

## 2024-04-26 DIAGNOSIS — I719 Aortic aneurysm of unspecified site, without rupture: Secondary | ICD-10-CM | POA: Diagnosis not present

## 2024-04-26 DIAGNOSIS — I700 Atherosclerosis of aorta: Secondary | ICD-10-CM | POA: Diagnosis not present

## 2024-04-26 DIAGNOSIS — Z23 Encounter for immunization: Secondary | ICD-10-CM | POA: Diagnosis not present

## 2024-04-26 DIAGNOSIS — R911 Solitary pulmonary nodule: Secondary | ICD-10-CM | POA: Diagnosis not present

## 2024-04-26 DIAGNOSIS — Z85828 Personal history of other malignant neoplasm of skin: Secondary | ICD-10-CM | POA: Diagnosis not present

## 2024-04-27 ENCOUNTER — Ambulatory Visit: Admitting: Orthopedic Surgery

## 2024-04-27 DIAGNOSIS — M791 Myalgia, unspecified site: Secondary | ICD-10-CM | POA: Diagnosis not present

## 2024-04-27 NOTE — Progress Notes (Signed)
 Chief complaint muscle soreness   history   Antonio Colon is 73 years old he had a right total knee arthroplasty December 07, 2023  He comes in complaining of muscle soreness from his thighs all the way down his legs.  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 Dean Every to see if that is possible  Otherwise keep regular appointments related to his knee

## 2024-04-28 ENCOUNTER — Telehealth: Payer: Self-pay | Admitting: Cardiovascular Disease

## 2024-04-28 NOTE — Telephone Encounter (Signed)
 Spoke to patient.   Patient states his primary decrease rosuvastatin  5 mg  every other day.  He saw his Orthopedic doctor . The Orthopedic  doctor informed patient , that usually he would need to stop medication for period of time to see if symptoms stay or go away.  But  the Doctor wanted patient to contact Cardiology before doing anything. This why he is calling.   RN will defer to Dr Katheryne Pane.   Patient was able to give Rn his last set of Lipid s  Total Chol 111 Trig 45 HDL 40  Total HDL - 2.8 LDL-C 11 LDL 60   Patient is aware Dr Katheryne Pane is not in the office will contact him once a response  has been given    Patient voiced understanding

## 2024-04-28 NOTE — Telephone Encounter (Signed)
 Pt c/o medication issue:  1. Name of Medication:   rosuvastatin  (CRESTOR ) 5 MG tablet   2. How are you currently taking this medication (dosage and times per day)?   As prescribed  3. Are you having a reaction (difficulty breathing--STAT)?   Pain in thigh and calves  4. What is your medication issue?   Patient stated he has been having pain in his legs and his PCP thinks it may be related to this medication and wants him to take this medication every other day.  Patient wants a call back to discuss this medication change.

## 2024-05-02 ENCOUNTER — Other Ambulatory Visit: Payer: Self-pay | Admitting: Orthopedic Surgery

## 2024-05-02 NOTE — Addendum Note (Signed)
 Addended byArla Lab on: 05/02/2024 12:21 PM   Modules accepted: Orders

## 2024-05-02 NOTE — Telephone Encounter (Signed)
 Dr. Delfino Fellers pt - spoke w/the pt, he is requesting a refill for Hydrocodone  10-325, 30 tablets, every 4 hours PRN to be sent to [facility].

## 2024-05-03 NOTE — Telephone Encounter (Signed)
 Pt stated he is having extreme pain in both legs and in his hips.    Somedays hurt worse then others.   He called in to find out the status of what he should do regarding taking this med  Best number  [phone]

## 2024-05-04 NOTE — Telephone Encounter (Signed)
 Spoke with patient and he will start his 3 month statin holiday today. He will give us  a call in 3 months with an update

## 2024-05-04 NOTE — Telephone Encounter (Signed)
 Pt calling in for an update.

## 2024-05-11 ENCOUNTER — Ambulatory Visit: Payer: Self-pay

## 2024-05-15 DIAGNOSIS — Z1283 Encounter for screening for malignant neoplasm of skin: Secondary | ICD-10-CM | POA: Diagnosis not present

## 2024-05-15 DIAGNOSIS — Z85820 Personal history of malignant melanoma of skin: Secondary | ICD-10-CM | POA: Diagnosis not present

## 2024-05-15 DIAGNOSIS — D225 Melanocytic nevi of trunk: Secondary | ICD-10-CM | POA: Diagnosis not present

## 2024-05-15 DIAGNOSIS — Z08 Encounter for follow-up examination after completed treatment for malignant neoplasm: Secondary | ICD-10-CM | POA: Diagnosis not present

## 2024-05-16 DIAGNOSIS — M4726 Other spondylosis with radiculopathy, lumbar region: Secondary | ICD-10-CM | POA: Diagnosis not present

## 2024-05-18 ENCOUNTER — Other Ambulatory Visit (HOSPITAL_COMMUNITY): Payer: Self-pay | Admitting: Neurosurgery

## 2024-05-18 DIAGNOSIS — M4726 Other spondylosis with radiculopathy, lumbar region: Secondary | ICD-10-CM

## 2024-05-29 ENCOUNTER — Other Ambulatory Visit: Payer: Self-pay | Admitting: Orthopedic Surgery

## 2024-05-29 DIAGNOSIS — M25559 Pain in unspecified hip: Secondary | ICD-10-CM

## 2024-05-29 DIAGNOSIS — M545 Low back pain, unspecified: Secondary | ICD-10-CM

## 2024-05-30 ENCOUNTER — Ambulatory Visit (HOSPITAL_COMMUNITY)
Admission: RE | Admit: 2024-05-30 | Discharge: 2024-05-30 | Disposition: A | Discharge status: Home / Self Care | Source: Ambulatory Visit | Attending: Neurosurgery | Admitting: Neurosurgery

## 2024-05-30 DIAGNOSIS — M4726 Other spondylosis with radiculopathy, lumbar region: Secondary | ICD-10-CM | POA: Insufficient documentation

## 2024-05-30 DIAGNOSIS — D1809 Hemangioma of other sites: Secondary | ICD-10-CM | POA: Diagnosis not present

## 2024-05-30 DIAGNOSIS — M48061 Spinal stenosis, lumbar region without neurogenic claudication: Secondary | ICD-10-CM | POA: Diagnosis not present

## 2024-05-30 DIAGNOSIS — M5135 Other intervertebral disc degeneration, thoracolumbar region: Secondary | ICD-10-CM | POA: Diagnosis not present

## 2024-05-30 DIAGNOSIS — M549 Dorsalgia, unspecified: Secondary | ICD-10-CM | POA: Diagnosis not present

## 2024-05-30 MED ORDER — GADOBUTROL 1 MMOL/ML IV SOLN
10.0000 mL | Freq: Once | INTRAVENOUS | Status: AC | PRN
  Administered 2024-05-30: 10 mL via INTRAVENOUS

## 2024-06-05 DIAGNOSIS — M25421 Effusion, right elbow: Secondary | ICD-10-CM | POA: Diagnosis not present

## 2024-06-05 DIAGNOSIS — I10 Essential (primary) hypertension: Secondary | ICD-10-CM | POA: Diagnosis not present

## 2024-06-06 DIAGNOSIS — M4316 Spondylolisthesis, lumbar region: Secondary | ICD-10-CM | POA: Diagnosis not present

## 2024-06-06 DIAGNOSIS — M48062 Spinal stenosis, lumbar region with neurogenic claudication: Secondary | ICD-10-CM | POA: Diagnosis not present

## 2024-06-06 DIAGNOSIS — M51369 Other intervertebral disc degeneration, lumbar region without mention of lumbar back pain or lower extremity pain: Secondary | ICD-10-CM | POA: Diagnosis not present

## 2024-06-08 ENCOUNTER — Other Ambulatory Visit: Payer: Self-pay | Admitting: Neurosurgery

## 2024-06-08 DIAGNOSIS — M48062 Spinal stenosis, lumbar region with neurogenic claudication: Secondary | ICD-10-CM

## 2024-06-14 NOTE — Discharge Instructions (Signed)

## 2024-06-15 ENCOUNTER — Ambulatory Visit
Admission: RE | Admit: 2024-06-15 | Discharge: 2024-06-15 | Disposition: A | Discharge status: Home / Self Care | Source: Ambulatory Visit | Attending: Neurosurgery | Admitting: Neurosurgery

## 2024-06-15 DIAGNOSIS — Z981 Arthrodesis status: Secondary | ICD-10-CM | POA: Diagnosis not present

## 2024-06-15 DIAGNOSIS — M48062 Spinal stenosis, lumbar region with neurogenic claudication: Secondary | ICD-10-CM | POA: Diagnosis not present

## 2024-06-15 MED ORDER — IOPAMIDOL (ISOVUE-M 200) INJECTION 41%
1.0000 mL | Freq: Once | INTRAMUSCULAR | Status: AC
  Administered 2024-06-15: 1 mL via EPIDURAL

## 2024-06-15 MED ORDER — METHYLPREDNISOLONE ACETATE 40 MG/ML INJ SUSP (RADIOLOG
80.0000 mg | Freq: Once | INTRAMUSCULAR | Status: AC
Start: 2024-06-15 — End: 2024-06-15
  Administered 2024-06-15: 80 mg via EPIDURAL

## 2024-06-26 DIAGNOSIS — M4316 Spondylolisthesis, lumbar region: Secondary | ICD-10-CM | POA: Diagnosis not present

## 2024-06-26 DIAGNOSIS — M48062 Spinal stenosis, lumbar region with neurogenic claudication: Secondary | ICD-10-CM | POA: Diagnosis not present

## 2024-07-12 ENCOUNTER — Other Ambulatory Visit: Payer: Self-pay | Admitting: Cardiovascular Disease

## 2024-08-24 ENCOUNTER — Other Ambulatory Visit: Payer: Self-pay | Admitting: Cardiovascular Disease

## 2024-08-24 ENCOUNTER — Other Ambulatory Visit: Payer: Self-pay

## 2024-08-24 NOTE — Progress Notes (Unsigned)
 Pharmacy Quality Measure Review  This patient is appearing on a report for being at risk of failing the adherence measure for cholesterol (statin) medications this calendar year.   Medication: rosuvastatin  5 mg daily Last fill date: 03/22/24 for 90 day supply  Patient is no longer taking rosuvastatin . Reported having leg pain and cardiologist stopped rosuvastatin . Patient states that leg pain was not from rosuvastatin , but he has not restarted yet. He states that he has a cardiologist appointment where he will talk about restarting.  Jenkins Graces, PharmD PGY1 Pharmacy Resident [phone]

## 2024-10-11 ENCOUNTER — Encounter (INDEPENDENT_AMBULATORY_CARE_PROVIDER_SITE_OTHER): Payer: Self-pay | Admitting: Gastroenterology

## 2024-10-20 DIAGNOSIS — I2510 Atherosclerotic heart disease of native coronary artery without angina pectoris: Secondary | ICD-10-CM | POA: Diagnosis not present

## 2024-10-20 DIAGNOSIS — R7301 Impaired fasting glucose: Secondary | ICD-10-CM | POA: Diagnosis not present

## 2024-11-11 ENCOUNTER — Other Ambulatory Visit: Payer: Self-pay | Admitting: Cardiovascular Disease

## 2024-12-07 ENCOUNTER — Ambulatory Visit: Payer: Medicare Other | Admitting: Orthopedic Surgery

## 2024-12-11 ENCOUNTER — Other Ambulatory Visit: Payer: Self-pay | Admitting: Neurosurgery

## 2024-12-11 DIAGNOSIS — M4316 Spondylolisthesis, lumbar region: Secondary | ICD-10-CM

## 2024-12-14 ENCOUNTER — Other Ambulatory Visit: Payer: Self-pay

## 2024-12-14 ENCOUNTER — Encounter: Payer: Self-pay | Admitting: Orthopedic Surgery

## 2024-12-14 ENCOUNTER — Ambulatory Visit: Admitting: Orthopedic Surgery

## 2024-12-14 DIAGNOSIS — M1711 Unilateral primary osteoarthritis, right knee: Secondary | ICD-10-CM

## 2024-12-14 DIAGNOSIS — Z96651 Presence of right artificial knee joint: Secondary | ICD-10-CM | POA: Diagnosis not present

## 2024-12-14 NOTE — Progress Notes (Signed)
 Patient ID: Antonio Colon, male   DOB: [birth year], 73 y.o.   MRN: [id number]  Chief Complaint  Patient presents with   Post-op Follow-up    Right knee TKR 12/07/23    HPI Antonio Colon is a 73 y.o. male.  History of osteoarthritis Status post right total knee arthroplasty. The patient is doing well the knee implant is functioning well.     Physical Exam There were no vitals taken for this visit.  Gait: Normal no cane no limp  Assist device no cane  ROM: 0-125 degrees   Data Reviewed Today's imaging shows stable total knee implant without loosening or complication  Assessment Doing well no issues   Plan Return 2 years x-ray    Taft Minerva 12/14/2024, 3:26 PM

## 2024-12-14 NOTE — Progress Notes (Signed)
° ° °  12/14/2024   Chief Complaint  Patient presents with   Post-op Follow-up    Right knee TKR 12/07/23    Encounter Diagnoses  Name Primary?   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 ____[facility] _______________________  DOI/DOS/ Date:    Did you get better, worse or no change (Answer below)   Improved

## 2024-12-25 NOTE — Discharge Instructions (Signed)

## 2024-12-26 ENCOUNTER — Ambulatory Visit
Admission: RE | Admit: 2024-12-26 | Discharge: 2024-12-26 | Disposition: A | Discharge status: Home / Self Care | Source: Ambulatory Visit | Attending: Neurosurgery | Admitting: Neurosurgery

## 2024-12-26 DIAGNOSIS — M4316 Spondylolisthesis, lumbar region: Secondary | ICD-10-CM

## 2024-12-26 MED ORDER — IOPAMIDOL (ISOVUE-M 200) INJECTION 41%
1.0000 mL | Freq: Once | INTRAMUSCULAR | Status: AC
  Administered 2024-12-26: 1 mL via EPIDURAL

## 2024-12-26 MED ORDER — METHYLPREDNISOLONE ACETATE 40 MG/ML INJ SUSP (RADIOLOG
80.0000 mg | Freq: Once | INTRAMUSCULAR | Status: AC
  Administered 2024-12-26: 80 mg via EPIDURAL

## 2026-12-16 ENCOUNTER — Ambulatory Visit: Admitting: Orthopedic Surgery
